# Patient Record
Sex: Female | Born: 1945
Health system: Southern US, Community
[De-identification: ages and names within clinical notes are randomized; demographics above are authoritative.]

## PROBLEM LIST (undated history)

## (undated) DIAGNOSIS — E079 Disorder of thyroid, unspecified: Secondary | ICD-10-CM

## (undated) DIAGNOSIS — D369 Benign neoplasm, unspecified site: Secondary | ICD-10-CM

## (undated) DIAGNOSIS — R011 Cardiac murmur, unspecified: Secondary | ICD-10-CM

## (undated) DIAGNOSIS — K648 Other hemorrhoids: Secondary | ICD-10-CM

## (undated) DIAGNOSIS — E041 Nontoxic single thyroid nodule: Secondary | ICD-10-CM

## (undated) DIAGNOSIS — K219 Gastro-esophageal reflux disease without esophagitis: Secondary | ICD-10-CM

## (undated) DIAGNOSIS — M8580 Other specified disorders of bone density and structure, unspecified site: Secondary | ICD-10-CM

## (undated) DIAGNOSIS — K222 Esophageal obstruction: Secondary | ICD-10-CM

## (undated) DIAGNOSIS — G4730 Sleep apnea, unspecified: Secondary | ICD-10-CM

## (undated) DIAGNOSIS — E039 Hypothyroidism, unspecified: Secondary | ICD-10-CM

## (undated) DIAGNOSIS — M1990 Unspecified osteoarthritis, unspecified site: Secondary | ICD-10-CM

## (undated) DIAGNOSIS — E785 Hyperlipidemia, unspecified: Secondary | ICD-10-CM

## (undated) HISTORY — DX: Esophageal obstruction: K22.2

## (undated) HISTORY — PX: COLONOSCOPY: SHX174

## (undated) HISTORY — DX: Other specified disorders of bone density and structure, unspecified site: M85.80

## (undated) HISTORY — PX: CATARACT EXTRACTION: SUR2

## (undated) HISTORY — DX: Benign neoplasm, unspecified site: D36.9

## (undated) HISTORY — DX: Hyperlipidemia, unspecified: E78.5

## (undated) HISTORY — DX: Nontoxic single thyroid nodule: E04.1

## (undated) HISTORY — DX: Other hemorrhoids: K64.8

## (undated) HISTORY — PX: BREAST SURGERY: SHX581

## (undated) HISTORY — DX: Disorder of thyroid, unspecified: E07.9

## (undated) HISTORY — PX: ABDOMINAL HYSTERECTOMY: SHX81

## (undated) HISTORY — DX: Unspecified osteoarthritis, unspecified site: M19.90

## (undated) HISTORY — DX: Cardiac murmur, unspecified: R01.1

## (undated) HISTORY — DX: Gastro-esophageal reflux disease without esophagitis: K21.9

---

## 2002-10-29 HISTORY — PX: OTHER SURGICAL HISTORY: SHX169

## 2015-06-08 DIAGNOSIS — K210 Gastro-esophageal reflux disease with esophagitis: Secondary | ICD-10-CM | POA: Diagnosis not present

## 2015-06-08 DIAGNOSIS — F419 Anxiety disorder, unspecified: Secondary | ICD-10-CM | POA: Diagnosis not present

## 2015-06-08 DIAGNOSIS — E039 Hypothyroidism, unspecified: Secondary | ICD-10-CM | POA: Diagnosis not present

## 2015-07-12 DIAGNOSIS — Z872 Personal history of diseases of the skin and subcutaneous tissue: Secondary | ICD-10-CM | POA: Diagnosis not present

## 2015-07-12 DIAGNOSIS — D225 Melanocytic nevi of trunk: Secondary | ICD-10-CM | POA: Diagnosis not present

## 2015-07-12 DIAGNOSIS — L720 Epidermal cyst: Secondary | ICD-10-CM | POA: Diagnosis not present

## 2015-07-12 DIAGNOSIS — L814 Other melanin hyperpigmentation: Secondary | ICD-10-CM | POA: Diagnosis not present

## 2015-07-22 DIAGNOSIS — Z23 Encounter for immunization: Secondary | ICD-10-CM | POA: Diagnosis not present

## 2016-01-16 ENCOUNTER — Telehealth: Payer: Self-pay | Admitting: *Deleted

## 2016-01-16 NOTE — Telephone Encounter (Signed)
Received patient medical records; forwarded to provider/SLS 03/20

## 2016-01-18 ENCOUNTER — Encounter: Payer: Self-pay | Admitting: Family Medicine

## 2016-01-18 ENCOUNTER — Ambulatory Visit (INDEPENDENT_AMBULATORY_CARE_PROVIDER_SITE_OTHER): Payer: Medicare Other | Admitting: Family Medicine

## 2016-01-18 VITALS — BP 124/82 | HR 68 | Ht 65.0 in | Wt 160.0 lb

## 2016-01-18 DIAGNOSIS — Z5181 Encounter for therapeutic drug level monitoring: Secondary | ICD-10-CM | POA: Diagnosis not present

## 2016-01-18 DIAGNOSIS — E890 Postprocedural hypothyroidism: Secondary | ICD-10-CM

## 2016-01-18 DIAGNOSIS — Z8719 Personal history of other diseases of the digestive system: Secondary | ICD-10-CM

## 2016-01-18 DIAGNOSIS — K219 Gastro-esophageal reflux disease without esophagitis: Secondary | ICD-10-CM

## 2016-01-18 DIAGNOSIS — E785 Hyperlipidemia, unspecified: Secondary | ICD-10-CM | POA: Insufficient documentation

## 2016-01-18 DIAGNOSIS — R0683 Snoring: Secondary | ICD-10-CM | POA: Insufficient documentation

## 2016-01-18 NOTE — Progress Notes (Signed)
[facility] [address], [place], [place] [address] [phone]  Date:  01/18/2016   Name:  Margaret Graham   DOB:  10-Mar-1946   MRN:  [id number]  PCP:  Lamar Blinks, MD    Chief Complaint: Establish Care; Gastroesophageal Reflux; and Hypothyroidism   History of Present Illness:  Margaret Graham is a 70 y.o. very pleasant female patient who presents with the following:  She and her husband recently moved to this area from [place].  They moved to be closer to her son and his family (3 granddaughters). So far they are liking [place] very much.   She has had a partial thyroidectomy for a thyroid mass- it was benign.  She is now on thyroid replacement She also has dyslipidemia which is treated with crestor About a year ago she got something stuck in her throat- she was dx with a Schatzki's ring.  She did have dilatation done at that time.  She feels like it might be coming back. Nothing has gotten stuck again but she is concerned about it Se does have some GERD sx but this is not severe She does take pepcid OTC - she takes 20 mg daily  She ate about 3.5 hours ago  She does snore at night and this wakes up her husband.  At this point she does not wish to go ahead with a sleep study because she does not want CPAP.  She will keep an eye on this. She does not have any sx such as daytime somnolence  There are no active problems to display for this patient.   Past Medical History  Diagnosis Date  . Arthritis   . GERD (gastroesophageal reflux disease)   . Heart murmur   . Thyroid disease   . Hyperlipidemia   . Schatzki's ring     Past Surgical History  Procedure Laterality Date  . Abdominal hysterectomy    . Breast surgery      Social History  Substance Use Topics  . Smoking status: Never Smoker   . Smokeless tobacco: Never Used  . Alcohol Use: 0.0 oz/week    0 Standard drinks or equivalent per week    Family  History  Problem Relation Age of Onset  . Arthritis Mother   . Arthritis Father   . Hyperlipidemia Father   . Diabetes Son   . Heart disease Paternal Uncle     Allergies  Allergen Reactions  . Declomycin [Demeclocycline]     Medication list has been reviewed and updated.  No current outpatient prescriptions on file prior to visit.   No current facility-administered medications on file prior to visit.    Review of Systems:  As per HPI- otherwise negative.   Physical Examination: Filed Vitals:   01/18/16 1516  BP: 124/82  Pulse: 68   Filed Vitals:   01/18/16 1516  Height: 4' 6.75" (1.391 m)  Weight: 160 lb (72.576 kg)   Body mass index is 37.51 kg/(m^2). Ideal Body Weight: Weight in (lb) to have BMI = 25: 106.4  GEN: WDWN, NAD, Non-toxic, A & O x 3, looks well, weight is normal (height and BMI above are incorrect, were corrected HEENT: Atraumatic, Normocephalic. Neck supple. No masses, No LAD.  Bilateral TM wnl, oropharynx normal.  PEERL,EOMI.   Small oropharyngeal space Ears and Nose: No external deformity. CV: RRR, No M/G/R. No JVD. No thrill. No extra heart sounds. PULM: CTA B, no wheezes, crackles,  rhonchi. No retractions. No resp. distress. No accessory muscle use. EXTR: No c/c/e NEURO Normal gait.  PSYCH: Normally interactive. Conversant. Not depressed or anxious appearing.  Calm demeanor.    Assessment and Plan: History of esophageal stricture - Plan: Ambulatory referral to Gastroenterology  Dyslipidemia - Plan: LDL cholesterol, direct  Medication monitoring encounter - Plan: Comprehensive metabolic panel  Postoperative hypothyroidism - Plan: TSH  Gastroesophageal reflux disease, esophagitis presence not specified  Snoring   She will decrease her pepcid to 20 mg once a day- of sx return she can go back to BID Referral to GI to monitor her esophageal ring Offered a sleep study- she declines for now but will keep this in mind Monitor TSH and LDL  today   Signed Lamar Blinks, MD

## 2016-01-18 NOTE — Patient Instructions (Signed)
We will refer you to GI to take a look at your throat for you Cut down on your pepcid to once a day- if your symptoms worsen you can go back to 2 daily I will be in touch with your labs asap We will contact your doctors office in [place] to get the rest of your records.

## 2016-01-19 ENCOUNTER — Encounter: Payer: Self-pay | Admitting: Family Medicine

## 2016-01-19 LAB — COMPREHENSIVE METABOLIC PANEL
ALT: 17 U/L (ref 0–35)
AST: 20 U/L (ref 0–37)
Albumin: 4.4 g/dL (ref 3.5–5.2)
Alkaline Phosphatase: 78 U/L (ref 39–117)
BILIRUBIN TOTAL: 0.4 mg/dL (ref 0.2–1.2)
BUN: 20 mg/dL (ref 6–23)
CO2: 29 meq/L (ref 19–32)
CREATININE: 0.89 mg/dL (ref 0.40–1.20)
Calcium: 9.6 mg/dL (ref 8.4–10.5)
Chloride: 104 mEq/L (ref 96–112)
GFR: 66.64 mL/min (ref >60.00)
GLUCOSE: 89 mg/dL (ref 70–99)
Potassium: 4.6 mEq/L (ref 3.5–5.1)
Sodium: 141 mEq/L (ref 135–145)
Total Protein: 7.4 g/dL (ref 6.0–8.3)

## 2016-01-19 LAB — LDL CHOLESTEROL, DIRECT: Direct LDL: 122 mg/dL

## 2016-01-19 LAB — TSH: TSH: 0.94 u[IU]/mL (ref 0.35–4.50)

## 2016-01-23 ENCOUNTER — Encounter: Payer: Self-pay | Admitting: Family Medicine

## 2016-01-23 DIAGNOSIS — M8580 Other specified disorders of bone density and structure, unspecified site: Secondary | ICD-10-CM | POA: Insufficient documentation

## 2016-01-23 MED ORDER — LEVOTHYROXINE SODIUM 112 MCG PO TABS: 112.0000 ug | ORAL_TABLET | ORAL | Status: DC

## 2016-01-25 ENCOUNTER — Encounter: Payer: Self-pay | Admitting: Family Medicine

## 2016-01-25 ENCOUNTER — Telehealth: Payer: Self-pay | Admitting: *Deleted

## 2016-01-25 NOTE — Telephone Encounter (Signed)
Receive patient's Medical Records; forwarded to provider/SLS 03/29

## 2016-01-26 ENCOUNTER — Telehealth: Payer: Self-pay

## 2016-01-26 NOTE — Telephone Encounter (Signed)
This encounter was created in error - please disregard.

## 2016-01-26 NOTE — Telephone Encounter (Signed)
Called patient informed medical records available for pick up at the front desk.

## 2016-01-27 ENCOUNTER — Encounter: Payer: Self-pay | Admitting: Gastroenterology

## 2016-03-22 ENCOUNTER — Ambulatory Visit (INDEPENDENT_AMBULATORY_CARE_PROVIDER_SITE_OTHER): Payer: Medicare Other | Admitting: Gastroenterology

## 2016-03-22 ENCOUNTER — Encounter: Payer: Self-pay | Admitting: Gastroenterology

## 2016-03-22 VITALS — BP 122/68 | HR 66 | Ht 65.0 in | Wt 155.0 lb

## 2016-03-22 DIAGNOSIS — K449 Diaphragmatic hernia without obstruction or gangrene: Secondary | ICD-10-CM | POA: Diagnosis not present

## 2016-03-22 DIAGNOSIS — K2100 Gastro-esophageal reflux disease with esophagitis, without bleeding: Secondary | ICD-10-CM

## 2016-03-22 MED ORDER — OMEPRAZOLE 20 MG PO CPDR: 20.0000 mg | DELAYED_RELEASE_CAPSULE | Freq: Every day | ORAL | Status: DC

## 2016-03-22 NOTE — Progress Notes (Signed)
[facility] Consult Note:  History: Margaret Graham 03/22/2016  Referring physician: Lamar Blinks, MD  Reason for consult/chief complaint: history of esophageal stricture and Constipation   Subjective HPI:  Moved her from [place] last year.  Had a normal colonoscopy in Jan 2015.  Last year had brief food impaction, then barium swallow and EGD with 89mm balloon dilation of Schatzki ring.  Was also told Grade 1 esophagitis,  hiatal hernia and "dysmotility".  She had written down the EGD findings, but did not have the actual report or photos.   Had had no preceding dysphagia before the one episode, and none since.  She had been dieting and eaten dry tuna the day it occurred. Denies odynophagia, nausea, vomiting, early satiety or weight loss.  Though she had no GERD symptoms, the EGD findings prompted her doc to recommend H2RA therapy, which she has taken since then. ROS:  Review of Systems She denies chest pain or dyspnea  Past Medical History: Past Medical History  Diagnosis Date  . Arthritis   . GERD (gastroesophageal reflux disease)   . Heart murmur   . Thyroid disease   . Hyperlipidemia   . Schatzki's ring      Past Surgical History: Past Surgical History  Procedure Laterality Date  . Abdominal hysterectomy    . Breast surgery       Family History: Family History  Problem Relation Age of Onset  . Arthritis Mother   . Arthritis Father   . Hyperlipidemia Father   . Diabetes Son   . Heart disease Paternal Uncle     Social History: Social History   Social History  . Marital Status: Married    Spouse Name: N/A  . Number of Children: N/A  . Years of Education: N/A   Social History Main Topics  . Smoking status: Never Smoker   . Smokeless tobacco: Never Used  . Alcohol Use: 0.0 oz/week    0 Standard drinks or equivalent per week  . Drug Use: No  . Sexual Activity:    Partners: Male   Other Topics Concern  . None   Social History Narrative    Retired from [employer] to [employer] 07/2015.   From [place].    Allergies: Allergies  Allergen Reactions  . Declomycin [Demeclocycline]     Outpatient Meds: Current Outpatient Prescriptions  Medication Sig Dispense Refill  . aspirin 81 MG tablet Take 81 mg by mouth daily.    . Calcium Carb-Cholecalciferol (CALCIUM 600 + D PO) Take by mouth.    . Cholecalciferol (VITAMIN D3 PO) Take 800 Int'l Units by mouth.    . famotidine (PEPCID) 20 MG tablet Take 20 mg by mouth daily.    [name] Kitchen ketoconazole (NIZORAL) 2 % shampoo Apply 1 application topically as needed for irritation.    [name] Kitchen levothyroxine (SYNTHROID, LEVOTHROID) 112 MCG tablet Take 1 tablet (112 mcg total) by mouth every morning. Take 1 Monday thru Saturday and 1/2 on sunday 90 tablet 3  . Multiple Vitamin (MULTIVITAMIN) capsule Take 1 capsule by mouth daily.    [name] Kitchen omeprazole (PRILOSEC) 20 MG capsule Take 1 capsule (20 mg total) by mouth daily. 90 capsule 1  . simvastatin (ZOCOR) 20 MG tablet Take 20 mg by mouth daily.    [name] Kitchen triamcinolone cream (KENALOG) 0.1 % Apply 1 application topically as needed.     No current facility-administered medications for this visit.      ___________________________________________________________________ Objective  Exam:  BP 122/68 mmHg  Pulse 66  Ht 5\' 5"  (1.651 m)  Wt 155 lb (70.308 kg)  BMI 25.79 kg/m2   General: this is a(n) well-appearing woman with good muscle mass.  Normal vocal quality   Eyes: sclera anicteric, no redness  ENT: oral mucosa moist without lesions, no cervical or supraclavicular lymphadenopathy, good dentition  CV: RRR without murmur, S1/S2, no JVD, no peripheral edema  Resp: clear to auscultation bilaterally, normal RR and effort noted  GI: soft, no tenderness, with active bowel sounds. No guarding or palpable organomegaly noted.  Skin; warm and dry, no rash or jaundice noted  Neuro: awake, alert and oriented x 3. Normal gross motor function and fluent  speech   Assessment: Encounter Diagnoses  Name Primary?  . Gastroesophageal reflux disease with esophagitis Yes  . Hiatal hernia    GERD based on reported EGD esophagitis finding.  Mild, since no symptoms.  "dysmotility" may have been speculative diagnosis or based on barium swallow findings.  Non-specific, possibly GERD-related. She has no dysphagia presently.  We discussed how ring will nearly always recur, even after dilation.But I still do not think she needs an EGD at this time.  Plan:  Change pepcid to omeprazole 20 mg QOD See me in a year or sooner if needed.  Thank you for the courtesy of this consult.  Please call me with any questions or concerns.  [name]  CCLamar Blinks, MD

## 2016-03-22 NOTE — Patient Instructions (Addendum)
Food Choices for Gastroesophageal Reflux Disease, Adult When you have gastroesophageal reflux disease (GERD), the foods you eat and your eating habits are very important. Choosing the right foods can help ease the discomfort of GERD. WHAT GENERAL GUIDELINES DO I NEED TO FOLLOW?  Choose fruits, vegetables, whole grains, low-fat dairy products, and low-fat meat, fish, and poultry.  Limit fats such as oils, salad dressings, butter, nuts, and avocado.  Keep a food diary to identify foods that cause symptoms.  Avoid foods that cause reflux. These may be different for different people.  Eat frequent small meals instead of three large meals each day.  Eat your meals slowly, in a relaxed setting.  Limit fried foods.  Cook foods using methods other than frying.  Avoid drinking alcohol.  Avoid drinking large amounts of liquids with your meals.  Avoid bending over or lying down until 2-3 hours after eating. WHAT FOODS ARE NOT RECOMMENDED? The following are some foods and drinks that may worsen your symptoms: Vegetables Tomatoes. Tomato juice. Tomato and spaghetti sauce. Chili peppers. Onion and garlic. Horseradish. Fruits Oranges, grapefruit, and lemon (fruit and juice). Meats High-fat meats, fish, and poultry. This includes hot dogs, ribs, ham, sausage, salami, and bacon. Dairy Whole milk and chocolate milk. Sour cream. Cream. Butter. Ice cream. Cream cheese.  Beverages Coffee and tea, with or without caffeine. Carbonated beverages or energy drinks. Condiments Hot sauce. Barbecue sauce.  Sweets/Desserts Chocolate and cocoa. Donuts. Peppermint and spearmint. Fats and Oils High-fat foods, including [place] fries and potato chips. Other Vinegar. Strong spices, such as black pepper, white pepper, red pepper, cayenne, curry powder, cloves, ginger, and chili powder. The items listed above may not be a complete list of foods and beverages to avoid. Contact your dietitian for more  information.   This information is not intended to replace advice given to you by your health care provider. Make sure you discuss any questions you have with your health care provider.   Document Released: 10/15/2005 Document Revised: 11/05/2014 Document Reviewed: 08/19/2013 Elsevier Interactive Patient Education [employer].   If you are age 64 or older, your body mass index should be between 23-30. Your There is no weight on file to calculate BMI. If this is out of the aforementioned range listed, please consider follow up with your Primary Care Provider.  If you are age 4 or younger, your body mass index should be between 19-25. Your There is no weight on file to calculate BMI. If this is out of the aformentioned range listed, please consider follow up with your Primary Care Provider.   Thank you for choosing [facility]  Dr Wilfrid Lund III

## 2016-04-02 ENCOUNTER — Ambulatory Visit
Admission: RE | Admit: 2016-04-02 | Discharge: 2016-04-02 | Disposition: A | Discharge status: Home / Self Care | Payer: Medicare Other | Source: Ambulatory Visit | Attending: Family Medicine | Admitting: Family Medicine

## 2016-04-02 DIAGNOSIS — Z780 Asymptomatic menopausal state: Secondary | ICD-10-CM | POA: Diagnosis not present

## 2016-04-02 DIAGNOSIS — M8589 Other specified disorders of bone density and structure, multiple sites: Secondary | ICD-10-CM | POA: Diagnosis not present

## 2016-04-02 DIAGNOSIS — M8580 Other specified disorders of bone density and structure, unspecified site: Secondary | ICD-10-CM

## 2016-04-09 ENCOUNTER — Encounter: Payer: Self-pay | Admitting: Family Medicine

## 2016-04-09 DIAGNOSIS — M810 Age-related osteoporosis without current pathological fracture: Secondary | ICD-10-CM

## 2016-04-23 ENCOUNTER — Other Ambulatory Visit: Payer: Self-pay | Admitting: Emergency Medicine

## 2016-04-23 MED ORDER — SIMVASTATIN 20 MG PO TABS: 20.0000 mg | ORAL_TABLET | Freq: Every day | ORAL | Status: DC

## 2016-05-08 DIAGNOSIS — H2513 Age-related nuclear cataract, bilateral: Secondary | ICD-10-CM | POA: Diagnosis not present

## 2016-05-24 ENCOUNTER — Encounter: Payer: Self-pay | Admitting: Internal Medicine

## 2016-05-24 ENCOUNTER — Ambulatory Visit (INDEPENDENT_AMBULATORY_CARE_PROVIDER_SITE_OTHER): Payer: Medicare Other | Admitting: Internal Medicine

## 2016-05-24 VITALS — BP 140/80 | HR 81 | Ht 65.0 in | Wt 155.0 lb

## 2016-05-24 DIAGNOSIS — M8580 Other specified disorders of bone density and structure, unspecified site: Secondary | ICD-10-CM

## 2016-05-24 LAB — COMPLETE METABOLIC PANEL WITH GFR
ALBUMIN: 4.2 g/dL (ref 3.6–5.1)
ALK PHOS: 85 U/L (ref 33–130)
ALT: 18 U/L (ref 6–29)
AST: 19 U/L (ref 10–35)
BILIRUBIN TOTAL: 0.5 mg/dL (ref 0.2–1.2)
BUN: 20 mg/dL (ref 7–25)
CO2: 29 mmol/L (ref 20–31)
Calcium: 9.7 mg/dL (ref 8.6–10.4)
Chloride: 103 mmol/L (ref 98–110)
Creat: 0.73 mg/dL (ref 0.60–0.93)
GFR, EST NON AFRICAN AMERICAN: 84 mL/min (ref >=60)
GFR, Est African American: >89 mL/min (ref >=60)
GLUCOSE: 116 mg/dL — AB (ref 65–99)
Potassium: 4.9 mmol/L (ref 3.5–5.3)
SODIUM: 142 mmol/L (ref 135–146)
TOTAL PROTEIN: 6.8 g/dL (ref 6.1–8.1)

## 2016-05-24 LAB — VITAMIN D 25 HYDROXY (VIT D DEFICIENCY, FRACTURES): VITD: 54.69 ng/mL (ref 30.00–100.00)

## 2016-05-24 NOTE — Progress Notes (Signed)
Patient ID: Margaret Graham, female   DOB: [birth year], 70 y.o.   MRN: [id number]    HPI  Margaret Graham is a 70 y.o.-year-old female, referred by her PCP, Dr. Lorelei Pont, for management of osteoporosis.  Pt was dx with Openia in 2008.   I reviewed pt's DEXA scans - worse at last check and her 10 year fracture risk is high: Date L1-L4 T score FN T score FRAX  04/02/2016 L1-L2: -2.3 RFN: -1.9 LFN: - 2.2 MOF: 21.2% Hip fx: 6.4%  08/09/2011 L1-L2: -1.4 RFN: -1.6 LFN: - 1.6   01/30/2007 L1-L2: -1.7 RFN: -1.4 LFN: - 1.5    She denies fractures or falls.  No dizziness/vertigo/orthostasis/poor vision.  She has no been on OP treatments.  No h/o vitamin D deficiency, but no vit D levels available for review.  Pt is on calcium and vitamin D - in the form of a MVI + 600 mg Ca daily. She also eats dairy and green, leafy, vegetables.   She has a h/o esophageal strictures.  No weight bearing exercises. She walks some, but not consistently.  She does not take high vitamin A doses.  Menopause was at mid-50's. On HRT for a long time.  No steroid inj or oral courses..   Pt does have a FH of osteoporosis in sister, son (he is on steroids).  No h/o hyper/hypocalcemia or hyperparathyroidism. No h/o kidney stones. Lab Results  Component Value Date   CALCIUM 9.6 01/18/2016   No h/o thyrotoxicosis. Reviewed TSH recent levels:  Lab Results  Component Value Date   TSH 0.94 01/18/2016   No h/o CKD. Last BUN/Cr: Lab Results  Component Value Date   BUN 20 01/18/2016   CREATININE 0.89 01/18/2016    ROS - no complaints: Constitutional: no weight gain/loss, no fatigue, no subjective hyperthermia/hypothermia Eyes: no blurry vision, no xerophthalmia ENT: no sore throat, no nodules palpated in throat, no dysphagia/odynophagia, no hoarseness Cardiovascular: no CP/SOB/palpitations/leg swelling Respiratory: no cough/SOB Gastrointestinal: no N/V/D/C Musculoskeletal: no muscle/joint aches Skin: no  rashes Neurological: no tremors/numbness/tingling/dizziness Psychiatric: no depression/anxiety  Past Medical History:  Diagnosis Date  . Arthritis   . GERD (gastroesophageal reflux disease)   . Heart murmur   . Hyperlipidemia   . Schatzki's ring   . Thyroid disease    Past Surgical History:  Procedure Laterality Date  . ABDOMINAL HYSTERECTOMY    . BREAST SURGERY     Social History   Social History  . Marital status: Married    Spouse name: N/A  . Number of children: 2   Occupational History  . retired   Social History Main Topics  . Smoking status: Never Smoker  . Smokeless tobacco: Never Used  . Alcohol use 0.0 oz/week - socially  . Drug use: No   Social History Narrative   Retired from [employer] to [employer] 07/2015.   From [place].   Current Outpatient Prescriptions on File Prior to Visit  Medication Sig Dispense Refill  . aspirin 81 MG tablet Take 81 mg by mouth daily.    . Calcium Carb-Cholecalciferol (CALCIUM 600 + D PO) Take by mouth.    . Cholecalciferol (VITAMIN D3 PO) Take 800 Int'l Units by mouth.    . famotidine (PEPCID) 20 MG tablet Take 20 mg by mouth daily.    [name] Kitchen ketoconazole (NIZORAL) 2 % shampoo Apply 1 application topically as needed for irritation.    [name] Kitchen levothyroxine (SYNTHROID, LEVOTHROID) 112 MCG tablet Take 1 tablet (112 mcg total) by mouth  every morning. Take 1 Monday thru Saturday and 1/2 on sunday 90 tablet 3  . Multiple Vitamin (MULTIVITAMIN) capsule Take 1 capsule by mouth daily.    [name] Kitchen omeprazole (PRILOSEC) 20 MG capsule Take 1 capsule (20 mg total) by mouth daily. 90 capsule 1  . simvastatin (ZOCOR) 20 MG tablet Take 1 tablet (20 mg total) by mouth daily. 90 tablet 0  . triamcinolone cream (KENALOG) 0.1 % Apply 1 application topically as needed.     No current facility-administered medications on file prior to visit.    Allergies  Allergen Reactions  . Declomycin [Demeclocycline]    Family History  Problem Relation Age of Onset  .  Arthritis Mother   . Arthritis Father   . Hyperlipidemia Father   . Diabetes Son   . Heart disease Paternal Uncle    PE: BP 140/80   Pulse 81   Ht 5\' 5"  (1.651 m)   Wt 155 lb (70.3 kg)   SpO2 (!) 89%   BMI 25.79 kg/m  Wt Readings from Last 3 Encounters:  05/24/16 155 lb (70.3 kg)  03/22/16 155 lb (70.3 kg)  01/18/16 160 lb (72.6 kg)   Constitutional: normal weight, in NAD. No kyphosis. Eyes: PERRLA, EOMI, no exophthalmos ENT: moist mucous membranes, no thyromegaly, no cervical lymphadenopathy Cardiovascular: RRR, No MRG Respiratory: CTA B Gastrointestinal: abdomen soft, NT, ND, BS+ Musculoskeletal: no deformities, strength intact in all 4, no spine tenderness to palpation Skin: moist, warm, no rashes Neurological: no tremor with outstretched hands, DTR normal in all 4  Assessment: 1. Osteoporosis  Plan: 1. Osteoporosis - likely postmenopausal, she has FH of early OP - Discussed about increased risk of fracture, depending on the T score, greatly increased when the T score is lower than -2.5, but it is actually a continuum and -2.5 should not be regarded as an absolute threshold. We reviewed her DEXA scan report together, and I explained that based on the T scores and the FRAX scores, she has an increased risk for fractures.  - we reviewed her dietary and supplemental calcium and vitamin D intakeshe gets ~1000-1200 mg of calcium daily. I will check vit D today to see if she needs supplementation - given her specific instructions about food sources for Calcium and Vitamin D - see pt instructions  - discussed fall precautions   - given handout from [facility] Re: weight bearing exercises - advised to do this every day or at least 5/7 days - we discussed about maintaining a good amount of protein in her diet. The recommended daily protein intake is ~0.8 g per kilogram per day. I advised her to try to aim for this amount, since a diet low in proteins can  exacerbate osteoporosis. Also, avoid smoking or >2 drinks of alcohol a day. - We discussed about the different medication classes, benefits and side effects (including atypical fractures and ONJ - no dental workup in progress or planned).  - I explained that, since she has Es strictures, I would not use oral bisphosphonates, so my first choice would be sq denosumab (Prolia) for 3-6 years, then zoledronic acid (iv Reclast) for 1-2 years, but we can also start with Zolendronic acid. I do not think she would qualify for Teriparatide for now. She agrees. Pt was given reading information about Prolia and Reclast, and I explained the mechanism of action and expected benefits. She will let me know about her decision. - will check a new DEXA scan in 2 years after  starting Prolia -  I explained that the first indication that the treatment is working is her not having anymore fractures. DEXA scan changes are secondary: unchanged or slightly higher T-scores are desirable - will see pt back in a year  Orders Placed This Encounter  Procedures  . COMPLETE METABOLIC PANEL WITH GFR  . VITAMIN D 25 Hydroxy (Vit-D Deficiency, Fractures)   Component     Latest Ref Rng & Units 05/24/2016  Sodium     135 - 146 mmol/L 142  Potassium     3.5 - 5.3 mmol/L 4.9  Chloride     98 - 110 mmol/L 103  CO2     20 - 31 mmol/L 29  Glucose     65 - 99 mg/dL 116 (H)  BUN     7 - 25 mg/dL 20  Creatinine     0.60 - 0.93 mg/dL 0.73  Total Bilirubin     0.2 - 1.2 mg/dL 0.5  Alkaline Phosphatase     33 - 130 U/L 85  AST     10 - 35 U/L 19  ALT     6 - 29 U/L 18  Total Protein     6.1 - 8.1 g/dL 6.8  Albumin     3.6 - 5.1 g/dL 4.2  Calcium     8.6 - 10.4 mg/dL 9.7  GFR, Est African American     >=60 mL/min >89  GFR, Est Non African American     >=60 mL/min 84  VITD     30.00 - 100.00 ng/mL 54.69   Labs are normal. Will await her tx decision.  Philemon Kingdom, MD PhD [facility]

## 2016-05-24 NOTE — Patient Instructions (Addendum)
Please stop at the lab.  Think about Reclast and Prolia and let me know how you want to proceed.  You need approx. 60 g protein per day.  Please return in 1 year.  Exercise for Strong Bones (from [facility]) There are two types of exercises that are important for building and maintaining bone density:  weight-bearing and muscle-strengthening exercises. Weight-bearing Exercises These exercises include activities that make you move against gravity while staying upright. Weight-bearing exercises can be high-impact or low-impact. High-impact weight-bearing exercises help build bones and keep them strong. If you have broken a bone due to osteoporosis or are at risk of breaking a bone, you may need to avoid high-impact exercises. If you're not sure, you should check with your healthcare provider. Examples of high-impact weight-bearing exercises are: . Dancing . Doing high-impact aerobics . Hiking . Jogging/running . Jumping Rope . Stair climbing . Tennis Low-impact weight-bearing exercises can also help keep bones strong and are a safe alternative if you cannot do high-impact exercises. Examples of low-impact weight-bearing exercises are: . Using elliptical training machines . Doing low-impact aerobics . Using stair-step machines . Fast walking on a treadmill or outside Muscle-Strengthening Exercises These exercises include activities where you move your body, a weight or some other resistance against gravity. They are also known as resistance exercises and include: . Lifting weights . Using elastic exercise bands . Using weight machines . Lifting your own body weight . Functional movements, such as standing and rising up on your toes Yoga and Pilates can also improve strength, balance and flexibility. However, certain positions may not be safe for people with osteoporosis or those at increased risk of broken bones. For example, exercises that have you bend forward  may increase the chance of breaking a bone in the spine. A physical therapist should be able to help you learn which exercises are safe and appropriate for you. Non-Impact Exercises Non-impact exercises can help you to improve balance, posture and how well you move in everyday activities. These exercises can also help to increase muscle strength and decrease the risk of falls and broken bones. Some of these exercises include: . Balance exercises that strengthen your legs and test your balance, such as Tai Chi, can decrease your risk of falls. . Posture exercises that improve your posture and reduce rounded or "sloping" shoulders can help you decrease the chance of breaking a bone, especially in the spine. . Functional exercises that improve how well you move can help you with everyday activities and decrease your chance of falling and breaking a bone. For example, if you have trouble getting up from a chair or climbing stairs, you should do these activities as exercises. A physical therapist can teach you balance, posture and functional exercises. Starting a New Exercise Program If you haven't exercised regularly for a while, check with your healthcare provider before beginning a new exercise program--particularly if you have health problems such as heart disease, diabetes or high blood pressure. If you're at high risk of breaking a bone, you should work with a physical therapist to develop a safe exercise program. Once you have your healthcare provider's approval, start slowly. If you've already broken bones in the spine because of osteoporosis, be very careful to avoid activities that require reaching down, bending forward, rapid twisting motions, heavy lifting and those that increase your chance of a fall. As you get started, your muscles may feel sore for a day or two after you exercise. If soreness lasts  longer, you may be working too hard and need to ease up. Exercises should be done in a pain-free  range of motion. How Much Exercise Do You Need? Weight-bearing exercises 30 minutes on most days of the week. Do a 30-minutesession or multiple sessions spread out throughout the day. The benefits to your bones are the same.   Muscle-strengthening exercises Two to three days per week. If you don't have much time for strengthening/resistance training, do small amounts at a time. You can do just one body part each day. For example do arms one day, legs the next and trunk the next. You can also spread these exercises out during your normal day.  Balance, posture and functional exercises Every day or as often as needed. You may want to focus on one area more than the others. If you have fallen or lose your balance, spend time doing balance exercises. If you are getting rounded shoulders, work more on posture exercises. If you have trouble climbing stairs or getting up from the couch, do more functional exercises. You can also perform these exercises at one time or spread them during your day. Work with a phyiscal therapist to learn the right exercises for you.    How Can I Prevent Falls? Men and women with osteoporosis need to take care not to fall down. Falls can break bones. Some reasons people fall are: Poor vision  Poor balance  Certain diseases that affect how you walk  Some types of medicine, such as sleeping pills.  Some tips to help prevent falls outdoors are: Use a cane or walker  Wear rubber-soled shoes so you don't slip  Walk on grass when sidewalks are slippery  In winter, put salt or kitty litter on icy sidewalks.  Some ways to help prevent falls indoors are: Keep rooms free of clutter, especially on floors  Use plastic or carpet runners on slippery floors  Wear low-heeled shoes that provide good support  Do not walk in socks, stockings, or slippers  Be sure carpets and area rugs have skid-proof backs or are tacked to the floor  Be sure stairs are well lit and have rails on both  sides  Put grab bars on bathroom walls near tub, shower, and toilet  Use a rubber bath mat in the shower or tub  Keep a flashlight next to your bed  Use a sturdy step stool with a handrail and wide steps  Add more lights in rooms (and night lights) Buy a cordless phone to keep with you so that you don't have to rush to the phone       when it rings and so that you can call for help if you fall.   (adapted from http://www.niams.[employer].at)  Dietary sources of calcium and vitamin D:  Calcium content (mg) - http://www.niams.https://www.gonzalez.org/  Fortified oatmeal, 1 packet 350  Sardines, canned in oil, with edible bones, 3 oz. 324  Cheddar cheese, 1 oz. shredded 306  Milk, nonfat, 1 cup 302  Milkshake, 1 cup 300  Yogurt, plain, low-fat, 1 cup 300  Soybeans, cooked, 1 cup 261  Tofu, firm, with calcium,  cup 204  Orange juice, fortified with calcium, 6 oz. 200-260 (varies)  Salmon, canned, with edible bones, 3 oz. 181  Pudding, instant, made with 2% milk,  cup 153  Baked beans, 1 cup 142  Cottage cheese, 1% milk fat, 1 cup 138  Spaghetti, lasagna, 1 cup 125  Frozen yogurt, vanilla, soft-serve,  cup 103  Ready-to-eat cereal,  fortified with calcium, 1 cup 100-1,000 (varies)  Cheese pizza, 1 slice 100  Fortified waffles, 2 100  Turnip greens, boiled,  cup 99  Broccoli, raw, 1 cup 90  Ice cream, vanilla,  cup 85  Soy or rice milk, fortified with calcium, 1 cup 80-500 (varies)   Vitamin D content (International Units, IU) - https://www.ars.usda.gov Cod liver oil, 1 tablespoon 1,360  Swordfish, cooked, 3 oz 566  Salmon (sockeye), cooked, 3 oz 447  Tuna fish, canned in water, drained, 3 oz 154  Orange juice fortified with vitamin D, 1 cup (check product labels, as amount of added vitamin D varies) 137  Milk, nonfat, reduced fat, and whole, vitamin D-fortified, 1 cup 115-124  Yogurt, fortified with 20% of the daily  value for vitamin D, 6 oz 80  Margarine, fortified, 1 tablespoon 60  Sardines, canned in oil, drained, 2 sardines 46  Liver, beef, cooked, 3 oz 42  Egg, 1 large (vitamin D is found in yolk) 41  Ready-to-eat cereal, fortified with 10% of the daily value for vitamin D, 0.75-1 cup  40  Cheese, Swiss, 1 oz 6   Zoledronic acid: Patient drug information (Up-to-Date) Copyright 743-303-6522 [employer] All rights reserved.  Brand Names: [place]  Reclast;  Zometa What is this drug used for?  .It is used to treat high calcium levels.  .It is used when treating some cancers.  .It is used to treat Paget's disease.  .It is used to put off or treat soft, brittle bones (osteoporosis).  .It may be given to you for other reasons. Talk with the doctor. What do I need to tell my doctor BEFORE I take this drug?  All products:  .If you have an allergy to zoledronic acid or any other part of this drug.  .If you are allergic to any drugs like this one, any other drugs, foods, or other substances. Tell your doctor about the allergy and what signs you had, like rash; hives; itching; shortness of breath; wheezing; cough; swelling of face, lips, tongue, or throat; or any other signs.  Reclast:  .If you have low calcium levels.  .If you have very bad kidney disease.  This is not a list of all drugs or health problems that interact with this drug.  Tell your doctor and pharmacist about all of your drugs (prescription or OTC, natural products, vitamins) and health problems. You must check to make sure that it is safe for you to take this drug with all of your drugs and health problems. Do not start, stop, or change the dose of any drug without checking with your doctor. What are some things I need to know or do while I take this drug?  All products:  .Tell dentists, surgeons, and other doctors that you use this drug.  .Worsening of asthma has happened in people taking drugs like this one. Talk with your doctor.   .This drug may raise the chance of a broken leg. Talk with your doctor.  .Have your blood work checked often. Talk with your doctor.  .Have a bone density test. Talk with your doctor.  .Have a dental exam before starting this drug.  .Take good care of your teeth. See a dentist often.  .Do not give to a child. Talk with your doctor.  .If you are 21 or older, use this drug with care. You could have more side effects.  .This drug may cause harm to the unborn baby if you take it while you are pregnant.  [name] Kitchen  Tell your doctor if you are pregnant or plan on getting pregnant. You will need to talk about the benefits and risks of using this drug while you are pregnant.  .Tell your doctor if you are breast-feeding. You will need to talk about any risks to your baby.  Zometa:  .Take calcium and vitamin D as you were told by your doctor.  Reclast:  .This drug works best when used with calcium/vitamin D and weight-bearing workouts like walking or PT (physical therapy).  .Follow the diet and workout plan that your doctor told you about.  .Use birth control that you can trust to prevent pregnancy while taking this drug. What are some side effects that I need to call my doctor about right away?  WARNING/CAUTION: Even though it may be rare, some people may have very bad and sometimes deadly side effects when taking a drug. Tell your doctor or get medical help right away if you have any of the following signs or symptoms that may be related to a very bad side effect:  .Signs of an allergic reaction, like rash; hives; itching; red, swollen, blistered, or peeling skin with or without fever; wheezing; tightness in the chest or throat; trouble breathing or talking; unusual hoarseness; or swelling of the mouth, face, lips, tongue, or throat.  .Signs of low calcium levels like muscle cramps or spasms, numbness and tingling, or seizures.  .Signs of kidney problems like unable to pass urine, change in the amount of urine  passed, blood in the urine, or a big weight gain.  .Very bad bone, joint, or muscle pain.  .Any new or strange groin, hip, or thigh pain.  .Chest pain.  .A heartbeat that does not feel normal.  .Slow heartbeat.  .Change in eyesight.  .Eye pain.  .Mouth sores.  .Trouble swallowing.  .Very bad pain when swallowing.  .Any bruising or bleeding.  .Pain where the shot was given.  .Redness or swelling where the shot is given.  .This drug may cause jawbone problems. The chance may be higher the longer you take this drug. The chance may be higher if you have cancer, dental problems, dentures that do not fit well, anemia, blood clotting problems, or an infection. The chance may also be higher if you are having dental work or if you are getting chemo, some steroid drugs, or radiation. Call your doctor right away if you have jaw swelling or pain. What are some other side effects of this drug?  All drugs may cause side effects. However, many people have no side effects or only have minor side effects. Call your doctor or get medical help if any of these side effects or any other side effects bother you or do not go away:  All products:  .Dizziness.  [name] KitchenUpset stomach or throwing up.  .Irritation where the shot is given.  .Feeling tired or weak.  .Belly pain.  [name] KitchenHeadache.  .Flu-like signs.  .Loose stools (diarrhea).  .Muscle or joint pain.  .Back pain.  Zometa:  .Not able to sleep.  .Not hungry.  .Hard stools (constipation).  .Weight loss.  .Cough.  These are not all of the side effects that may occur. If you have questions about side effects, call your doctor. Call your doctor for medical advice about side effects.  You may report side effects to your national health agency. How is this drug best taken?  Use this drug as ordered by your doctor. Read and follow the dosing on the label closely.  All products:  .  It is given as a shot into a vein over a period of time.  .Drink lots of noncaffeine  liquids unless told to drink less liquid by your doctor.  Reclast:  .Acetaminophen may be given to lower fever and chills.  .Drink at least 2 glasses of liquids a few hours before you get this drug. What do I do if I miss a dose?  .Call the doctor to find out what to do. How do I store and/or throw out this drug?  [name] KitchenThis drug will be given to you in a hospital or doctor's office. You will not store it at home.  [name] KitchenKeep all drugs out of the reach of children and pets.  .Check with your pharmacist about how to throw out unused drugs.  General drug facts  .If your symptoms or health problems do not get better or if they become worse, call your doctor.  .Do not share your drugs with others and do not take anyone else's drugs.  [name] KitchenKeep a list of all your drugs (prescription, natural products, vitamins, OTC) with you. Give this list to your doctor.  .Talk with the doctor before starting any new drug, including prescription or OTC, natural products, or vitamins.  .Some drugs may have another patient information leaflet. If you have any questions about this drug, please talk with your doctor, pharmacist, or other health care provider.  .If you think there has been an overdose, call your poison control center or get medical care right away. Be ready to tell or show what was taken, how much, and when it happened.   Denosumab: Patient drug information (Up-to-date) Copyright [phone] [employer] All rights reserved.  Brand Names: [place]  ProliaRivka Barbara What is this drug used for?  .It is used to treat soft, brittle bones (osteoporosis).  .It is used for bone growth.  .It is used when treating some cancers.  .It may be given to you for other reasons. Talk with the doctor. What do I need to tell my doctor BEFORE I take this drug?  All products:  .If you have an allergy to denosumab or any other part of this drug.  .If you are allergic to any drugs like this one, any other drugs, foods, or other  substances. Tell your doctor about the allergy and what signs you had, like rash; hives; itching; shortness of breath; wheezing; cough; swelling of face, lips, tongue, or throat; or any other signs.  .If you have low calcium levels.  ProliaT:  .If you are pregnant or may be pregnant. Do not take this drug if you are pregnant.  This is not a list of all drugs or health problems that interact with this drug.  Tell your doctor and pharmacist about all of your drugs (prescription or OTC, natural products, vitamins) and health problems. You must check to make sure that it is safe for you to take this drug with all of your drugs and health problems. Do not start, stop, or change the dose of any drug without checking with your doctor. What are some things I need to know or do while I take this drug?  All products:  .Tell dentists, surgeons, and other doctors that you use this drug.  .This drug may raise the chance of a broken leg. Talk with your doctor.  .Have your blood work checked. Talk with your doctor.  .Have a bone density test. Talk with your doctor.  .Take calcium and vitamin D as you were told by your  doctor.  .Have a dental exam before starting this drug.  .Take good care of your teeth. See a dentist often.  .If you smoke, talk with your doctor.  .Do not give to a child. Talk with your doctor.  .Tell your doctor if you are breast-feeding. You will need to talk about any risks to your baby.  Xgeva:  .This drug may cause harm to the unborn baby if you take it while you are pregnant. If you get pregnant while taking this drug, call your doctor right away.  ProliaT:  .Very bad infections have been reported with use of this drug. If you have any infection, are taking antibiotics now or in the recent past, or have many infections, talk with your doctor.  .You may have more chance of getting an infection. Wash hands often. Stay away from people with infections, colds, or flu.  .Use birth control  that you can trust to prevent pregnancy while taking this drug.  .If you are a man and your sex partner is pregnant or gets pregnant at any time while you are being treated, talk with your doctor. What are some side effects that I need to call my doctor about right away?  WARNING/CAUTION: Even though it may be rare, some people may have very bad and sometimes deadly side effects when taking a drug. Tell your doctor or get medical help right away if you have any of the following signs or symptoms that may be related to a very bad side effect:  All products:  .Signs of an allergic reaction, like rash; hives; itching; red, swollen, blistered, or peeling skin with or without fever; wheezing; tightness in the chest or throat; trouble breathing or talking; unusual hoarseness; or swelling of the mouth, face, lips, tongue, or throat.  .Signs of low calcium levels like muscle cramps or spasms, numbness and tingling, or seizures.  .Mouth sores.  .Any new or strange groin, hip, or thigh pain.  .This drug may cause jawbone problems. The chance may be higher the longer you take this drug. The chance may be higher if you have cancer, dental problems, dentures that do not fit well, anemia, blood clotting problems, or an infection. The chance may also be higher if you are having dental work or if you are getting chemo, some steroid drugs, or radiation. Call your doctor right away if you have jaw swelling or pain.  Xgeva:  .Not hungry.  .Muscle pain or weakness.  .Seizures.  .Shortness of breath.  ProliaT:  .Signs of infection. These include a fever of 100.88F (38C) or higher, chills, very bad sore throat, ear or sinus pain, cough, more sputum or change in color of sputum, pain with passing urine, mouth sores, wound that will not heal, or anal itching or pain.  .Signs of a pancreas problem (pancreatitis) like very bad stomach pain, very bad back pain, or very bad upset stomach or throwing up.  .Chest pain.  .A  heartbeat that does not feel normal.  .Very bad skin irritation.  .Feeling very tired or weak.  .Bladder pain or pain when passing urine or change in how much urine is passed.  .Passing urine often.  .Swelling in the arms or legs. What are some other side effects of this drug?  All drugs may cause side effects. However, many people have no side effects or only have minor side effects. Call your doctor or get medical help if any of these side effects or any other side effects bother you  or do not go away:  Xgeva:  .Feeling tired or weak.  [name] KitchenHeadache.  [name] KitchenUpset stomach or throwing up.  .Loose stools (diarrhea).  .Cough.  ProliaT:  .Back pain.  .Muscle or joint pain.  .Sore throat.  .Runny nose.  .Pain in arms or legs.  These are not all of the side effects that may occur. If you have questions about side effects, call your doctor. Call your doctor for medical advice about side effects.  You may report side effects to your national health agency. How is this drug best taken?  Use this drug as ordered by your doctor. Read and follow the dosing on the label closely.  .It is given as a shot into the fatty part of the skin. What do I do if I miss a dose?  .Call the doctor to find out what to do. How do I store and/or throw out this drug?  [name] KitchenThis drug will be given to you in a hospital or doctor's office. You will not store it at home.  [name] KitchenKeep all drugs out of the reach of children and pets.  .Check with your pharmacist about how to throw out unused drugs.  General drug facts  .If your symptoms or health problems do not get better or if they become worse, call your doctor.  .Do not share your drugs with others and do not take anyone else's drugs.  [name] KitchenKeep a list of all your drugs (prescription, natural products, vitamins, OTC) with you. Give this list to your doctor.  .Talk with the doctor before starting any new drug, including prescription or OTC, natural products, or vitamins.  .Some drugs  may have another patient information leaflet. If you have any questions about this drug, please talk with your doctor, pharmacist, or other health care provider.  .If you think there has been an overdose, call your poison control center or get medical care right away. Be ready to tell or show what was taken, how much, and when it happened.

## 2016-05-25 ENCOUNTER — Encounter: Payer: Self-pay | Admitting: Internal Medicine

## 2016-07-24 ENCOUNTER — Other Ambulatory Visit: Payer: Self-pay | Admitting: Family Medicine

## 2016-07-24 ENCOUNTER — Telehealth: Payer: Self-pay | Admitting: Family Medicine

## 2016-07-24 DIAGNOSIS — Z119 Encounter for screening for infectious and parasitic diseases, unspecified: Secondary | ICD-10-CM

## 2016-07-24 DIAGNOSIS — E039 Hypothyroidism, unspecified: Secondary | ICD-10-CM

## 2016-07-24 MED ORDER — SIMVASTATIN 20 MG PO TABS: 20.0000 mg | ORAL_TABLET | Freq: Every day | ORAL | 0 refills | Status: DC

## 2016-07-24 NOTE — Telephone Encounter (Signed)
Patient is wondering about the Hepatitis C screening. She knows that she is in the age group for getting it done but is wondering if it is absolutely necessary if age is her only factor?  Patient would also like to come in and get her TSH checked as she remembers being told to have it checked again in September. She will schedule her blood work and flu shot when the orders are ready.

## 2016-07-25 ENCOUNTER — Encounter: Payer: Self-pay | Admitting: Family Medicine

## 2016-07-25 NOTE — Telephone Encounter (Signed)
Sent mychart message

## 2016-08-03 ENCOUNTER — Encounter: Payer: Self-pay | Admitting: Family Medicine

## 2016-08-03 ENCOUNTER — Other Ambulatory Visit: Payer: Medicare Other

## 2016-08-03 DIAGNOSIS — E039 Hypothyroidism, unspecified: Secondary | ICD-10-CM | POA: Diagnosis not present

## 2016-08-03 DIAGNOSIS — Z119 Encounter for screening for infectious and parasitic diseases, unspecified: Secondary | ICD-10-CM

## 2016-08-03 LAB — TSH: TSH: 0.27 m[IU]/L — AB

## 2016-08-03 NOTE — Addendum Note (Signed)
Addended by: Caffie Pinto on: 08/03/2016 02:13 PM   Modules accepted: Orders

## 2016-08-04 ENCOUNTER — Encounter: Payer: Self-pay | Admitting: Family Medicine

## 2016-08-04 DIAGNOSIS — E034 Atrophy of thyroid (acquired): Secondary | ICD-10-CM

## 2016-08-04 LAB — HEPATITIS C ANTIBODY: HCV Ab: NEGATIVE

## 2016-08-05 MED ORDER — LEVOTHYROXINE SODIUM 100 MCG PO TABS: 100.0000 ug | ORAL_TABLET | ORAL | 3 refills | Status: AC

## 2016-08-06 ENCOUNTER — Ambulatory Visit: Payer: Medicare Other

## 2016-08-20 ENCOUNTER — Ambulatory Visit (INDEPENDENT_AMBULATORY_CARE_PROVIDER_SITE_OTHER): Payer: Medicare Other

## 2016-08-20 DIAGNOSIS — Z23 Encounter for immunization: Secondary | ICD-10-CM

## 2016-08-27 ENCOUNTER — Ambulatory Visit (HOSPITAL_BASED_OUTPATIENT_CLINIC_OR_DEPARTMENT_OTHER)
Admission: RE | Admit: 2016-08-27 | Discharge: 2016-08-27 | Disposition: A | Discharge status: Home / Self Care | Payer: Medicare Other | Source: Ambulatory Visit | Attending: Family Medicine | Admitting: Family Medicine

## 2016-08-27 ENCOUNTER — Ambulatory Visit (INDEPENDENT_AMBULATORY_CARE_PROVIDER_SITE_OTHER): Payer: Medicare Other | Admitting: Family Medicine

## 2016-08-27 VITALS — BP 121/63 | HR 84 | Temp 97.7°F | Wt 157.0 lb

## 2016-08-27 DIAGNOSIS — R05 Cough: Secondary | ICD-10-CM

## 2016-08-27 DIAGNOSIS — R059 Cough, unspecified: Secondary | ICD-10-CM

## 2016-08-27 DIAGNOSIS — J9811 Atelectasis: Secondary | ICD-10-CM | POA: Diagnosis not present

## 2016-08-27 DIAGNOSIS — R5383 Other fatigue: Secondary | ICD-10-CM | POA: Diagnosis not present

## 2016-08-27 DIAGNOSIS — I700 Atherosclerosis of aorta: Secondary | ICD-10-CM | POA: Insufficient documentation

## 2016-08-27 DIAGNOSIS — J984 Other disorders of lung: Secondary | ICD-10-CM | POA: Insufficient documentation

## 2016-08-27 MED ORDER — HYDROCOD POLST-CPM POLST ER 10-8 MG/5ML PO SUER: 5.0000 mL | Freq: Two times a day (BID) | ORAL | 0 refills | Status: DC | PRN

## 2016-08-27 NOTE — Patient Instructions (Addendum)
You likely have a viral infection- your flu test is negative which is good news!  Use the tussionex as needed for cough- however remember that it will make you sleepy and it lasts up to 12 hours- be careful with use!   We will get a chest xray for you today to make sure you do not have pneumonia - I will be in touch with this result asap

## 2016-08-27 NOTE — Progress Notes (Signed)
Pre visit review using our clinic review tool, if applicable. No additional management support is needed unless otherwise documented below in the visit note. 

## 2016-08-27 NOTE — Progress Notes (Signed)
[place] at [facility] [address], [place], [place] [address] [phone] [id number] [phone]  Date:  08/27/2016   Name:  Margaret Graham   DOB:  [birth year]   MRN:  [id number]  PCP:  Lamar Blinks, MD    Chief Complaint: Cough (phlem clear color, ear fullness, chest congestion. cannot stop coughing, muscle aches. )   History of Present Illness:  Margaret Graham is a 70 y.o. very pleasant female patient who presents with the following:  Last seen by myself in March of this year to establish care.  History of partial thyroidectomy for a benign thyroid mass.    She is here today for a sick visit- started last Wednesday (today is Monday). She first noted phlegm in her throat, then severe cough, body aches, fatigue.  She has not noted a fever No vomiting.  Her left ear feels congested, no ST however.  No belly pain She is able to eat   Her husband has not been ill- he did have heart surgery not long ago.  Her son, DIL and grand-kids have had a cold but this was a couple of weeks or more ago She recently had a flu shot and wonders if she might have the flu  She has tried some OTC meds- nyquil, mucinex DM However this is really not helping with her cough that does keep her awake at night   Patient Active Problem List   Diagnosis Date Noted  . Osteopenia 01/23/2016  . Dyslipidemia 01/18/2016  . History of esophageal stricture 01/18/2016  . Postoperative hypothyroidism 01/18/2016  . Esophageal reflux 01/18/2016  . Snoring 01/18/2016    Past Medical History:  Diagnosis Date  . Arthritis   . GERD (gastroesophageal reflux disease)   . Heart murmur   . Hyperlipidemia   . Schatzki's ring   . Thyroid disease     Past Surgical History:  Procedure Laterality Date  . ABDOMINAL HYSTERECTOMY    . BREAST SURGERY      Social History  Substance Use Topics  . Smoking status: Never Smoker  . Smokeless tobacco: Never Used  . Alcohol use 0.0  oz/week    Family History  Problem Relation Age of Onset  . Arthritis Mother   . Arthritis Father   . Hyperlipidemia Father   . Diabetes Son   . Heart disease Paternal Uncle     Allergies  Allergen Reactions  . Declomycin [Demeclocycline]     Medication list has been reviewed and updated.  Current Outpatient Prescriptions on File Prior to Visit  Medication Sig Dispense Refill  . aspirin 81 MG tablet Take 81 mg by mouth daily.    . Calcium Carb-Cholecalciferol (CALCIUM 600 + D PO) Take by mouth.    . Cholecalciferol (VITAMIN D3 PO) Take 800 Int'l Units by mouth.    . famotidine (PEPCID) 20 MG tablet Take 20 mg by mouth daily.    [name] Kitchen ketoconazole (NIZORAL) 2 % shampoo Apply 1 application topically as needed for irritation.    [name] Kitchen levothyroxine (SYNTHROID, LEVOTHROID) 100 MCG tablet Take 1 tablet (100 mcg total) by mouth every morning. 30 tablet 3  . Multiple Vitamin (MULTIVITAMIN) capsule Take 1 capsule by mouth daily.    [name] Kitchen omeprazole (PRILOSEC) 20 MG capsule Take 1 capsule (20 mg total) by mouth daily. 90 capsule 1  . simvastatin (ZOCOR) 20 MG tablet Take 1 tablet (20 mg total) by mouth daily. 90 tablet 0  . triamcinolone cream (KENALOG) 0.1 %  Apply 1 application topically as needed.     No current facility-administered medications on file prior to visit.     Review of Systems:  As per HPI- otherwise negative. She has used tussionex in the past with success  No GI symptoms   Physical Examination: Blood pressure 121/63, pulse 84, temperature 97.7 F (36.5 C), temperature source Oral, weight 157 lb (71.2 kg), SpO2 100 %.  Ideal Body Weight:    GEN: WDWN, NAD, Non-toxic, A & O x 3, looks well HEENT: Atraumatic, Normocephalic. Neck supple. No masses, No LAD.  Bilateral TM wnl, oropharynx normal.  PEERL,EOMI.   Ears and Nose: No external deformity. CV: RRR, No M/G/R. No JVD. No thrill. No extra heart sounds. PULM: CTA B, no wheezes, crackles, rhonchi. No retractions. No  resp. distress. No accessory muscle use. ABD: S, NT, ND, +BS. No rebound. No HSM. EXTR: No c/c/e NEURO Normal gait.  PSYCH: Normally interactive. Conversant. Not depressed or anxious appearing.  Calm demeanor.   Dg Chest 2 View  Result Date: 08/27/2016 CLINICAL DATA:  Cough and congestion for 5 days,nonsmoker EXAM: CHEST  2 VIEW COMPARISON:  None. FINDINGS: Biapical pleuroparenchymal scarring appear stable. Airway thickening is present, suggesting bronchitis or reactive airways disease. Cardiac and mediastinal margins appear normal. Atherosclerotic calcification of the aortic arch noted. No significant airspace opacities. Possible right middle lobe subsegmental atelectasis. The possible enchondroma in the right proximal humeral metaphysis. IMPRESSION: 1. Airway thickening is present, suggesting bronchitis or reactive airways disease. 2. Biapical pleuroparenchymal scarring. 3. Subsegmental atelectasis in the right middle lobe. 4. Atherosclerotic calcification of the aortic arch. Electronically Signed   By: Van Clines M.D.   On: 08/27/2016 12:37   Negative rapid flu today Assessment and Plan: Cough - Plan: chlorpheniramine-HYDROcodone (TUSSIONEX PENNKINETIC ER) 10-8 MG/5ML SUER, DG Chest 2 View  Fatigue, unspecified type  Here today with several days of cough - rapid flu is negative.  Will check CXR but suspect her sx are viral in origin.  Will treat with tussionex as needed for cough Released her CXR results to her via mychart   Signed Lamar Blinks, MD

## 2016-08-29 ENCOUNTER — Encounter: Payer: Self-pay | Admitting: Family Medicine

## 2016-08-29 ENCOUNTER — Other Ambulatory Visit: Payer: Self-pay | Admitting: Emergency Medicine

## 2016-08-29 ENCOUNTER — Telehealth: Payer: Self-pay | Admitting: Family Medicine

## 2016-08-29 DIAGNOSIS — R059 Cough, unspecified: Secondary | ICD-10-CM

## 2016-08-29 DIAGNOSIS — R05 Cough: Secondary | ICD-10-CM

## 2016-08-29 MED ORDER — PREDNISONE 20 MG PO TABS: ORAL_TABLET | ORAL | 0 refills | Status: DC

## 2016-08-29 NOTE — Telephone Encounter (Signed)
Called her back- she is feeling very tired and is coughing still Her stomach is sore from coughing so much No fever, chills or aches. The cough is her main sx and she also feels very tired.  She would like to try prednisone which I think is a reasonable next step given her recent CXR results  Dg Chest 2 View  Result Date: 08/27/2016 CLINICAL DATA:  Cough and congestion for 5 days,nonsmoker EXAM: CHEST  2 VIEW COMPARISON:  None. FINDINGS: Biapical pleuroparenchymal scarring appear stable. Airway thickening is present, suggesting bronchitis or reactive airways disease. Cardiac and mediastinal margins appear normal. Atherosclerotic calcification of the aortic arch noted. No significant airspace opacities. Possible right middle lobe subsegmental atelectasis. The possible enchondroma in the right proximal humeral metaphysis. IMPRESSION: 1. Airway thickening is present, suggesting bronchitis or reactive airways disease. 2. Biapical pleuroparenchymal scarring. 3. Subsegmental atelectasis in the right middle lobe. 4. Atherosclerotic calcification of the aortic arch. Electronically Signed   By: Van Clines M.D.   On: 08/27/2016 12:37

## 2016-08-29 NOTE — Telephone Encounter (Signed)
Caller name: Relationship to patient: Self Can be reached: [phone]  Pharmacy:  Reason for call: Patient request call back. States she sent a My Chart message this morning and really needs to speak with her doctor or nurse. Plse adv

## 2016-09-10 ENCOUNTER — Encounter: Payer: Self-pay | Admitting: Family Medicine

## 2016-09-11 ENCOUNTER — Encounter: Payer: Self-pay | Admitting: Medical

## 2016-09-11 ENCOUNTER — Ambulatory Visit (INDEPENDENT_AMBULATORY_CARE_PROVIDER_SITE_OTHER): Payer: Medicare Other | Admitting: Medical

## 2016-09-11 VITALS — BP 102/68 | HR 80 | Temp 98.0°F | Ht 65.0 in | Wt 158.0 lb

## 2016-09-11 DIAGNOSIS — R059 Cough, unspecified: Secondary | ICD-10-CM

## 2016-09-11 DIAGNOSIS — R05 Cough: Secondary | ICD-10-CM | POA: Diagnosis not present

## 2016-09-11 DIAGNOSIS — J209 Acute bronchitis, unspecified: Secondary | ICD-10-CM | POA: Diagnosis not present

## 2016-09-11 MED ORDER — BENZONATATE 100 MG PO CAPS
100.0000 mg | ORAL_CAPSULE | Freq: Three times a day (TID) | ORAL | 0 refills | Status: DC | PRN
Start: 2016-09-11 — End: 2016-09-17

## 2016-09-11 MED ORDER — AZITHROMYCIN 250 MG PO TABS: ORAL_TABLET | ORAL | 0 refills | Status: DC

## 2016-09-11 MED ORDER — PREDNISONE 10 MG PO TABS: ORAL_TABLET | ORAL | 0 refills | Status: DC

## 2016-09-11 NOTE — Progress Notes (Signed)
Pre visit review using our clinic review tool, if applicable. No additional management support is needed unless otherwise documented below in the visit note./hsm  

## 2016-09-11 NOTE — Progress Notes (Signed)
Subjective:    Patient ID: Margaret Graham, female    DOB: [birth year], 70 y.o.   MRN: [id number]  HPI  Pt in for follow up.  Pt states 2 weeks ago got 5 day course of prednisone. Pt states cxr showed some reactive airway disease.   Pt states for 5 days she felt well after treatment. But then got sick again. Cough came back with rare occasional small amount mucous. She wants to get better since family is coming to town next week for thanksgiving.  No fever, no chills or sweats. No history of inhaler use.  Pt was given tussionex for cough. Did not help as nyquil.   Not history of smoking and no second smoke.  Pt states Dr. Lorelei Pont gave prednisone before xray results came back.  Pt notes that if she was given inhalers she would not use.    Review of Systems  Constitutional: Negative for chills, fatigue and fever.  HENT: Negative for congestion, ear discharge, postnasal drip, sinus pain, sinus pressure, sore throat and voice change.   Respiratory: Positive for cough. Negative for chest tightness, shortness of breath and wheezing.        Mild productive cough.  Cardiovascular: Negative for chest pain and palpitations.  Gastrointestinal: Negative for abdominal pain, constipation, diarrhea and nausea.  Musculoskeletal: Negative for back pain.  Neurological: Negative for dizziness, syncope, speech difficulty, weakness and light-headedness.  Hematological: Negative for adenopathy. Does not bruise/bleed easily.  Psychiatric/Behavioral: Negative for behavioral problems and confusion.    Past Medical History:  Diagnosis Date  . Arthritis   . GERD (gastroesophageal reflux disease)   . Heart murmur   . Hyperlipidemia   . Schatzki's ring   . Thyroid disease      Social History   Social History  . Marital status: Married    Spouse name: N/A  . Number of children: N/A  . Years of education: N/A   Occupational History  . Not on file.   Social History Main Topics  . Smoking  status: Never Smoker  . Smokeless tobacco: Never Used  . Alcohol use 0.0 oz/week  . Drug use: No  . Sexual activity: Not Currently    Partners: Male   Other Topics Concern  . Not on file   Social History Narrative   Retired from [employer] to [employer] 07/2015.   From [place].    Past Surgical History:  Procedure Laterality Date  . ABDOMINAL HYSTERECTOMY    . BREAST SURGERY      Family History  Problem Relation Age of Onset  . Arthritis Mother   . Arthritis Father   . Hyperlipidemia Father   . Diabetes Son   . Heart disease Paternal Uncle     Allergies  Allergen Reactions  . Declomycin [Demeclocycline]     Current Outpatient Prescriptions on File Prior to Visit  Medication Sig Dispense Refill  . aspirin 81 MG tablet Take 81 mg by mouth daily.    . Calcium Carb-Cholecalciferol (CALCIUM 600 + D PO) Take by mouth.    . chlorpheniramine-HYDROcodone (TUSSIONEX PENNKINETIC ER) 10-8 MG/5ML SUER Take 5 mLs by mouth every 12 (twelve) hours as needed for cough. 60 mL 0  . Cholecalciferol (VITAMIN D3 PO) Take 800 Int'l Units by mouth.    [name] Kitchen ketoconazole (NIZORAL) 2 % shampoo Apply 1 application topically as needed for irritation.    [name] Kitchen levothyroxine (SYNTHROID, LEVOTHROID) 100 MCG tablet Take 1 tablet (100 mcg total) by mouth every morning.  30 tablet 3  . Multiple Vitamin (MULTIVITAMIN) capsule Take 1 capsule by mouth daily.    [name] Kitchen omeprazole (PRILOSEC) 20 MG capsule Take 1 capsule (20 mg total) by mouth daily. 90 capsule 1  . predniSONE (DELTASONE) 20 MG tablet Take 2 pills daily for 3 days, then 1 pill daily for 3 days 9 tablet 0  . simvastatin (ZOCOR) 20 MG tablet Take 1 tablet (20 mg total) by mouth daily. 90 tablet 0  . triamcinolone cream (KENALOG) 0.1 % Apply 1 application topically as needed.     No current facility-administered medications on file prior to visit.     BP 102/68 (BP Location: Left Arm, Patient Position: Sitting, Cuff Size: Normal)   Pulse 80   Temp 98 F  (36.7 C) (Oral)   Ht 5\' 5"  (1.651 m)   Wt 158 lb (71.7 kg)   SpO2 98%   BMI 26.29 kg/m       Objective:   Physical Exam  General  Mental Status - Alert. General Appearance - Well groomed. Not in acute distress. Very pleasant pt.  Skin Rashes- No Rashes.  HEENT Head- Normal. Ear Auditory Canal - Left- Normal. Right - Normal.Tympanic Membrane- Left- Normal. Right- Normal. Eye Sclera/Conjunctiva- Left- Normal. Right- Normal. Nose & Sinuses Nasal Mucosa- Left-  Boggy and Congested. Right-  Boggy and  Congested.Bilateral no  maxillary and no frontal sinus pressure. Mouth & Throat Lips: Upper Lip- Normal: no dryness, cracking, pallor, cyanosis, or vesicular eruption. Lower Lip-Normal: no dryness, cracking, pallor, cyanosis or vesicular eruption. Buccal Mucosa- Bilateral- No Aphthous ulcers. Oropharynx- No Discharge or Erythema. Tonsils: Characteristics- Bilateral- No Erythema or Congestion. Size/Enlargement- Bilateral- No enlargement. Discharge- bilateral-None.  Neck Neck- Supple. No Masses.   Chest and Lung Exam Auscultation: Breath Sounds:- even and unlabored. Mild shallow respirations but no wheeze.  Cardiovascular Auscultation:Rythm- Regular, rate and rhythm. Murmurs & Other Heart Sounds:Ausculatation of the heart reveal- No Murmurs.  Lymphatic Head & Neck General Head & Neck Lymphatics: Bilateral: Description- No Localized lymphadenopathy.       Assessment & Plan:  You appear to have bronchitis. Rest hydrate and tylenol for fever. I am prescribing benzonatate cough medicine(if nyquil not working), and start azithromycin antibiotic.   If you get any wheezing or severe constant cough then start prednisone taper dose. Making available.  You should gradually get better. If not then notify us and would recommend a chest xray.  Follow up in 7-10 days or as needed  Note I did discuss with pt that I wanted to rx inhalers and use prednisone as last resort but she  mentions she would not use inhalers.  Gorman Safi, Percell Miller, PA-C

## 2016-09-11 NOTE — Patient Instructions (Addendum)
You appear to have bronchitis. Rest hydrate and tylenol for fever. I am prescribing benzonatate cough medicine(if nyquil not working), and start azithromycin antibiotic.   If you get any wheezing or severe constant cough then start prednisone taper dose. Making available.  You should gradually get better. If not then notify us and would recommend a chest xray.  Follow up in 7-10 days or as needed   Note I did discuss with pt that I wanted to rx inhalers and use prednisone as last resort but she mentions she would not use inhalers.

## 2016-09-12 ENCOUNTER — Encounter: Payer: Self-pay | Admitting: Medical

## 2016-09-16 ENCOUNTER — Encounter: Payer: Self-pay | Admitting: Family Medicine

## 2016-09-17 ENCOUNTER — Ambulatory Visit (INDEPENDENT_AMBULATORY_CARE_PROVIDER_SITE_OTHER): Payer: Medicare Other | Admitting: Family Medicine

## 2016-09-17 ENCOUNTER — Encounter: Payer: Self-pay | Admitting: Family Medicine

## 2016-09-17 ENCOUNTER — Ambulatory Visit (HOSPITAL_BASED_OUTPATIENT_CLINIC_OR_DEPARTMENT_OTHER)
Admission: RE | Admit: 2016-09-17 | Discharge: 2016-09-17 | Disposition: A | Discharge status: Home / Self Care | Payer: Medicare Other | Source: Ambulatory Visit | Attending: Family Medicine | Admitting: Family Medicine

## 2016-09-17 VITALS — BP 122/64 | HR 64 | Temp 97.7°F | Ht 65.0 in | Wt 153.8 lb

## 2016-09-17 DIAGNOSIS — R9389 Abnormal findings on diagnostic imaging of other specified body structures: Secondary | ICD-10-CM

## 2016-09-17 DIAGNOSIS — R918 Other nonspecific abnormal finding of lung field: Secondary | ICD-10-CM | POA: Insufficient documentation

## 2016-09-17 DIAGNOSIS — R05 Cough: Secondary | ICD-10-CM | POA: Diagnosis not present

## 2016-09-17 DIAGNOSIS — R053 Chronic cough: Secondary | ICD-10-CM

## 2016-09-17 DIAGNOSIS — R059 Cough, unspecified: Secondary | ICD-10-CM

## 2016-09-17 DIAGNOSIS — J9801 Acute bronchospasm: Secondary | ICD-10-CM

## 2016-09-17 MED ORDER — ALBUTEROL SULFATE 108 (90 BASE) MCG/ACT IN AEPB: 2.0000 | INHALATION_SPRAY | Freq: Four times a day (QID) | RESPIRATORY_TRACT | 2 refills | Status: DC | PRN

## 2016-09-17 MED ORDER — MONTELUKAST SODIUM 10 MG PO TABS: 10.0000 mg | ORAL_TABLET | Freq: Every day | ORAL | 3 refills | Status: DC

## 2016-09-17 MED ORDER — FLUTICASONE PROPIONATE HFA 110 MCG/ACT IN AERO: 1.0000 | INHALATION_SPRAY | Freq: Two times a day (BID) | RESPIRATORY_TRACT | 12 refills | Status: DC

## 2016-09-17 MED ORDER — FLUTICASONE PROPIONATE HFA 110 MCG/ACT IN AERO
1.0000 | INHALATION_SPRAY | Freq: Two times a day (BID) | RESPIRATORY_TRACT | 12 refills | Status: DC
Start: 2016-09-17 — End: 2017-11-22

## 2016-09-17 NOTE — Progress Notes (Signed)
[place] at [facility] [place], [place], [place] [address] [phone]  Date:  09/17/2016   Name:  Margaret Graham   DOB:  1945-12-28   MRN:  [id number]  PCP:  Lamar Blinks, MD    Chief Complaint: Cough (c/o prod cough with yellow mucus, congestion, stuffy ears, fatigue x 1 month. )   History of Present Illness:  Margaret Graham is a 69 y.o. very pleasant female patient who presents with the following:  Here today with persistent illness I saw her on 08/27/16 with cough- we did a CXR which was negative and used tussionex, and also prednisone.   She did get back to normal, but then got sick again within about 10 days.   She returned on 11/14 and saw Percell Miller with recurrent sx- he treated her with a 5 day course of prednisone and a zpack, tessalon perles.    She feels like she is still about the same at she was last week- she feels tired, she is still coughing.  The cough can "feel really deep," she tried some albuterol that her son had for his asthmaand it did seem to help her.   She has not had a fever as far as she knows.   Her ears feel stuffy still No GI symptoms No sneezing No antipyretics used- she is using nyquil at night.  She does not need more tussionex- the nyquil helps her to sleep She did not use her albuterol today  Her husband is doing well from his recent cardiac surgery  Mild abnl cxr from last month as below:  Dg Chest 2 View  Result Date: 08/27/2016 CLINICAL DATA:  Cough and congestion for 5 days,nonsmoker EXAM: CHEST  2 VIEW COMPARISON:  None. FINDINGS: Biapical pleuroparenchymal scarring appear stable. Airway thickening is present, suggesting bronchitis or reactive airways disease. Cardiac and mediastinal margins appear normal. Atherosclerotic calcification of the aortic arch noted. No significant airspace opacities. Possible right middle lobe subsegmental atelectasis. The possible enchondroma in the  right proximal humeral metaphysis. IMPRESSION: 1. Airway thickening is present, suggesting bronchitis or reactive airways disease. 2. Biapical pleuroparenchymal scarring. 3. Subsegmental atelectasis in the right middle lobe. 4. Atherosclerotic calcification of the aortic arch. Electronically Signed   By: Van Clines M.D.   On: 08/27/2016 12:37     Patient Active Problem List   Diagnosis Date Noted  . Osteopenia 01/23/2016  . Dyslipidemia 01/18/2016  . History of esophageal stricture 01/18/2016  . Postoperative hypothyroidism 01/18/2016  . Esophageal reflux 01/18/2016  . Snoring 01/18/2016    Past Medical History:  Diagnosis Date  . Arthritis   . GERD (gastroesophageal reflux disease)   . Heart murmur   . Hyperlipidemia   . Schatzki's ring   . Thyroid disease     Past Surgical History:  Procedure Laterality Date  . ABDOMINAL HYSTERECTOMY    . BREAST SURGERY      Social History  Substance Use Topics  . Smoking status: Never Smoker  . Smokeless tobacco: Never Used  . Alcohol use 0.0 oz/week    Family History  Problem Relation Age of Onset  . Arthritis Mother   . Arthritis Father   . Hyperlipidemia Father   . Diabetes Son   . Heart disease Paternal Uncle     Allergies  Allergen Reactions  . Declomycin [Demeclocycline]     Medication list has been reviewed and updated.  Current Outpatient Prescriptions on File Prior to Visit  Medication Sig Dispense Refill  . aspirin 81 MG tablet Take 81 mg by mouth daily.    . Calcium Carb-Cholecalciferol (CALCIUM 600 + D PO) Take by mouth.    . chlorpheniramine-HYDROcodone (TUSSIONEX PENNKINETIC ER) 10-8 MG/5ML SUER Take 5 mLs by mouth every 12 (twelve) hours as needed for cough. 60 mL 0  . Cholecalciferol (VITAMIN D3 PO) Take 800 Int'l Units by mouth.    [name] Kitchen ketoconazole (NIZORAL) 2 % shampoo Apply 1 application topically as needed for irritation.    [name] Kitchen levothyroxine (SYNTHROID, LEVOTHROID) 100 MCG tablet Take 1 tablet  (100 mcg total) by mouth every morning. 30 tablet 3  . Multiple Vitamin (MULTIVITAMIN) capsule Take 1 capsule by mouth daily.    [name] Kitchen omeprazole (PRILOSEC) 20 MG capsule Take 1 capsule (20 mg total) by mouth daily. 90 capsule 1  . simvastatin (ZOCOR) 20 MG tablet Take 1 tablet (20 mg total) by mouth daily. 90 tablet 0  . triamcinolone cream (KENALOG) 0.1 % Apply 1 application topically as needed.     No current facility-administered medications on file prior to visit.     Review of Systems:  As per HPI- otherwise negative.   Physical Examination: Vitals:   09/17/16 1145  BP: 122/64  Pulse: 64  Temp: 97.7 F (36.5 C)   Vitals:   09/17/16 1145  Weight: 153 lb 12.8 oz (69.8 kg)  Height: 5\' 5"  (1.651 m)   Body mass index is 25.59 kg/m. Ideal Body Weight: Weight in (lb) to have BMI = 25: 149.9  GEN: WDWN, NAD, Non-toxic, A & O x 3, looks well, coughing in room  HEENT: Atraumatic, Normocephalic. Neck supple. No masses, No LAD.  Bilateral TM wnl, oropharynx normal.  PEERL,EOMI.   Removed cerumen from left ear  Ears and Nose: No external deformity. CV: RRR, No M/G/R. No JVD. No thrill. No extra heart sounds. PULM: CTA B, no wheezes, crackles, rhonchi. No retractions. No resp. distress. No accessory muscle use. EXTR: No c/c/e NEURO Normal gait.  PSYCH: Normally interactive. Conversant. Not depressed or anxious appearing.  Calm demeanor.   Assessment and Plan: Bronchospasm - Plan: montelukast (SINGULAIR) 10 MG tablet, Albuterol Sulfate (PROAIR RESPICLICK) 123XX123 (90 Base) MCG/ACT AEPB, fluticasone (FLOVENT HFA) 110 MCG/ACT inhaler  Cough - Plan: DG Chest 2 View  Here today with recurrent/ persistent bronchospasm and cough Will treat with an inhaled steroid, albuterol, add singulair.  Repeat CXR today- results as below. Discussed with pt over mychart- this does not mean that she has cancer but we need to schedule a CT scan.  Will do so asap.  She states understanding  Dg Chest 2  View  Result Date: 09/17/2016 CLINICAL DATA:  Dry cough. EXAM: CHEST  2 VIEW COMPARISON:  08/27/2016 . FINDINGS: Mediastinum hilar structures are normal. Heart size normal. Stable biapical pleural parenchymal thickening consistent with scarring. Stable density in the right mid lung. Although this may be related to scarring nonenhanced chest CT of the chest suggested to exclude mass lesion. No pleural effusion pneumothorax. Heart size normal. Degenerative changes scoliosis thoracic spine IMPRESSION: Persistent density in the right mid lung. Although this may represent a focal site of scarring, a mass lesion cannot be excluded. Nonenhanced chest CT suggested for further evaluation . Electronically Signed   By: Marcello Moores  Register   On: 09/17/2016 12:34   Dg Chest 2 View  Result Date: 08/27/2016 CLINICAL DATA:  Cough and congestion for 5 days,nonsmoker EXAM: CHEST  2 VIEW COMPARISON:  None. FINDINGS: Biapical pleuroparenchymal  scarring appear stable. Airway thickening is present, suggesting bronchitis or reactive airways disease. Cardiac and mediastinal margins appear normal. Atherosclerotic calcification of the aortic arch noted. No significant airspace opacities. Possible right middle lobe subsegmental atelectasis. The possible enchondroma in the right proximal humeral metaphysis. IMPRESSION: 1. Airway thickening is present, suggesting bronchitis or reactive airways disease. 2. Biapical pleuroparenchymal scarring. 3. Subsegmental atelectasis in the right middle lobe. 4. Atherosclerotic calcification of the aortic arch. Electronically Signed   By: Van Clines M.D.   On: 08/27/2016 12:37     Signed Lamar Blinks, MD

## 2016-09-17 NOTE — Patient Instructions (Signed)
Please go downstairs for a chest x-ray- I will be in touch with your results asap Please start on the flovent (inhaled steroid) 1 puff twice a day.  You can also use the albuterol inhaler as needed We will add singulair once a day as well Please keep me posted regarding your progress!

## 2016-09-17 NOTE — Progress Notes (Signed)
Pre visit review using our clinic review tool, if applicable. No additional management support is needed unless otherwise documented below in the visit note. 

## 2016-09-21 ENCOUNTER — Telehealth: Payer: Self-pay | Admitting: Family Medicine

## 2016-09-21 ENCOUNTER — Ambulatory Visit (HOSPITAL_BASED_OUTPATIENT_CLINIC_OR_DEPARTMENT_OTHER)
Admission: RE | Admit: 2016-09-21 | Discharge: 2016-09-21 | Disposition: A | Discharge status: Home / Self Care | Payer: Medicare Other | Source: Ambulatory Visit | Attending: Family Medicine | Admitting: Family Medicine

## 2016-09-21 DIAGNOSIS — R938 Abnormal findings on diagnostic imaging of other specified body structures: Secondary | ICD-10-CM | POA: Insufficient documentation

## 2016-09-21 DIAGNOSIS — R918 Other nonspecific abnormal finding of lung field: Secondary | ICD-10-CM | POA: Diagnosis not present

## 2016-09-21 DIAGNOSIS — R9389 Abnormal findings on diagnostic imaging of other specified body structures: Secondary | ICD-10-CM

## 2016-09-21 DIAGNOSIS — J984 Other disorders of lung: Secondary | ICD-10-CM | POA: Diagnosis not present

## 2016-09-21 DIAGNOSIS — R911 Solitary pulmonary nodule: Secondary | ICD-10-CM | POA: Diagnosis not present

## 2016-09-21 DIAGNOSIS — I700 Atherosclerosis of aorta: Secondary | ICD-10-CM | POA: Insufficient documentation

## 2016-09-21 NOTE — Telephone Encounter (Signed)
Called pt to go over chest CT- LMOM.  Happy to report that she does not appear to have lung cancer, most likely scarring.  They have recommended a repeat scan in 3 months that I will order for her.  Let me know if any questions   IMPRESSION: 12 mm irregular nodular area in the right middle lobe with adjacent scarring. This could reflect an area of nodular scarring. Consider following in 3 months for both low-risk and high-risk individuals with repeat chest CT.  Biapical scarring.  Scattered aortic atherosclerosis

## 2016-09-25 NOTE — Addendum Note (Signed)
Addended by: Lamar Blinks C on: 09/25/2016 01:56 PM   Modules accepted: Orders

## 2016-09-27 DIAGNOSIS — J309 Allergic rhinitis, unspecified: Secondary | ICD-10-CM | POA: Diagnosis not present

## 2016-09-27 DIAGNOSIS — R05 Cough: Secondary | ICD-10-CM | POA: Diagnosis not present

## 2016-09-27 DIAGNOSIS — K219 Gastro-esophageal reflux disease without esophagitis: Secondary | ICD-10-CM | POA: Diagnosis not present

## 2016-09-28 ENCOUNTER — Encounter: Payer: Self-pay | Admitting: Gastroenterology

## 2016-10-24 ENCOUNTER — Encounter: Payer: Self-pay | Admitting: Family Medicine

## 2016-10-24 ENCOUNTER — Other Ambulatory Visit: Payer: Self-pay | Admitting: Family Medicine

## 2016-10-24 MED ORDER — SIMVASTATIN 20 MG PO TABS: 20.0000 mg | ORAL_TABLET | Freq: Every day | ORAL | 0 refills | Status: AC

## 2016-11-02 ENCOUNTER — Encounter: Payer: Self-pay | Admitting: Family Medicine

## 2016-11-02 ENCOUNTER — Telehealth: Payer: Self-pay | Admitting: Emergency Medicine

## 2016-11-02 NOTE — Telephone Encounter (Signed)
Called pt back. Apologized for taking so long getting back to pt. Informed pt that provider does not work on Fridays and will be back on Monday. Informed pt that if she feels her sx's are bad enough she could go to Urgent Care to be seen. Pt states that she will go to Urgent Care first thing in the morning to be seen. Asked pt to call the office back Monday if sx's have worsened or have not improved after being seen at Urgent Care. Pt verbalized understanding.

## 2016-11-03 DIAGNOSIS — R062 Wheezing: Secondary | ICD-10-CM | POA: Diagnosis not present

## 2016-11-03 DIAGNOSIS — R05 Cough: Secondary | ICD-10-CM | POA: Diagnosis not present

## 2016-11-23 NOTE — Telephone Encounter (Signed)
Done

## 2016-12-06 DIAGNOSIS — H6502 Acute serous otitis media, left ear: Secondary | ICD-10-CM | POA: Diagnosis not present

## 2016-12-27 ENCOUNTER — Encounter: Payer: Self-pay | Admitting: Family Medicine

## 2017-01-14 DIAGNOSIS — H04123 Dry eye syndrome of bilateral lacrimal glands: Secondary | ICD-10-CM | POA: Diagnosis not present

## 2017-01-21 DIAGNOSIS — E039 Hypothyroidism, unspecified: Secondary | ICD-10-CM | POA: Diagnosis not present

## 2017-01-21 DIAGNOSIS — M8580 Other specified disorders of bone density and structure, unspecified site: Secondary | ICD-10-CM | POA: Diagnosis not present

## 2017-01-21 DIAGNOSIS — Z0000 Encounter for general adult medical examination without abnormal findings: Secondary | ICD-10-CM | POA: Diagnosis not present

## 2017-01-21 DIAGNOSIS — E782 Mixed hyperlipidemia: Secondary | ICD-10-CM | POA: Diagnosis not present

## 2017-01-21 DIAGNOSIS — R911 Solitary pulmonary nodule: Secondary | ICD-10-CM | POA: Diagnosis not present

## 2017-01-28 DIAGNOSIS — R911 Solitary pulmonary nodule: Secondary | ICD-10-CM | POA: Diagnosis not present

## 2017-01-30 DIAGNOSIS — R918 Other nonspecific abnormal finding of lung field: Secondary | ICD-10-CM | POA: Diagnosis not present

## 2017-02-04 DIAGNOSIS — H04123 Dry eye syndrome of bilateral lacrimal glands: Secondary | ICD-10-CM | POA: Diagnosis not present

## 2017-03-06 DIAGNOSIS — L570 Actinic keratosis: Secondary | ICD-10-CM | POA: Diagnosis not present

## 2017-03-06 DIAGNOSIS — L564 Polymorphous light eruption: Secondary | ICD-10-CM | POA: Diagnosis not present

## 2017-03-06 DIAGNOSIS — L821 Other seborrheic keratosis: Secondary | ICD-10-CM | POA: Diagnosis not present

## 2017-03-06 DIAGNOSIS — D225 Melanocytic nevi of trunk: Secondary | ICD-10-CM | POA: Diagnosis not present

## 2017-03-06 DIAGNOSIS — L218 Other seborrheic dermatitis: Secondary | ICD-10-CM | POA: Diagnosis not present

## 2017-03-11 DIAGNOSIS — Z1231 Encounter for screening mammogram for malignant neoplasm of breast: Secondary | ICD-10-CM | POA: Diagnosis not present

## 2017-03-13 DIAGNOSIS — L570 Actinic keratosis: Secondary | ICD-10-CM | POA: Diagnosis not present

## 2017-03-18 DIAGNOSIS — M8580 Other specified disorders of bone density and structure, unspecified site: Secondary | ICD-10-CM | POA: Diagnosis not present

## 2017-03-18 DIAGNOSIS — E039 Hypothyroidism, unspecified: Secondary | ICD-10-CM | POA: Diagnosis not present

## 2017-03-18 DIAGNOSIS — E782 Mixed hyperlipidemia: Secondary | ICD-10-CM | POA: Diagnosis not present

## 2017-03-27 IMAGING — CR DG CHEST 2V
2 series · 2 of 2 positions shown · non-contrast
Comparison: None.

CLINICAL DATA: Cough and congestion for 5 days,nonsmoker

EXAM:
CHEST  2 VIEW

[w chest pa]
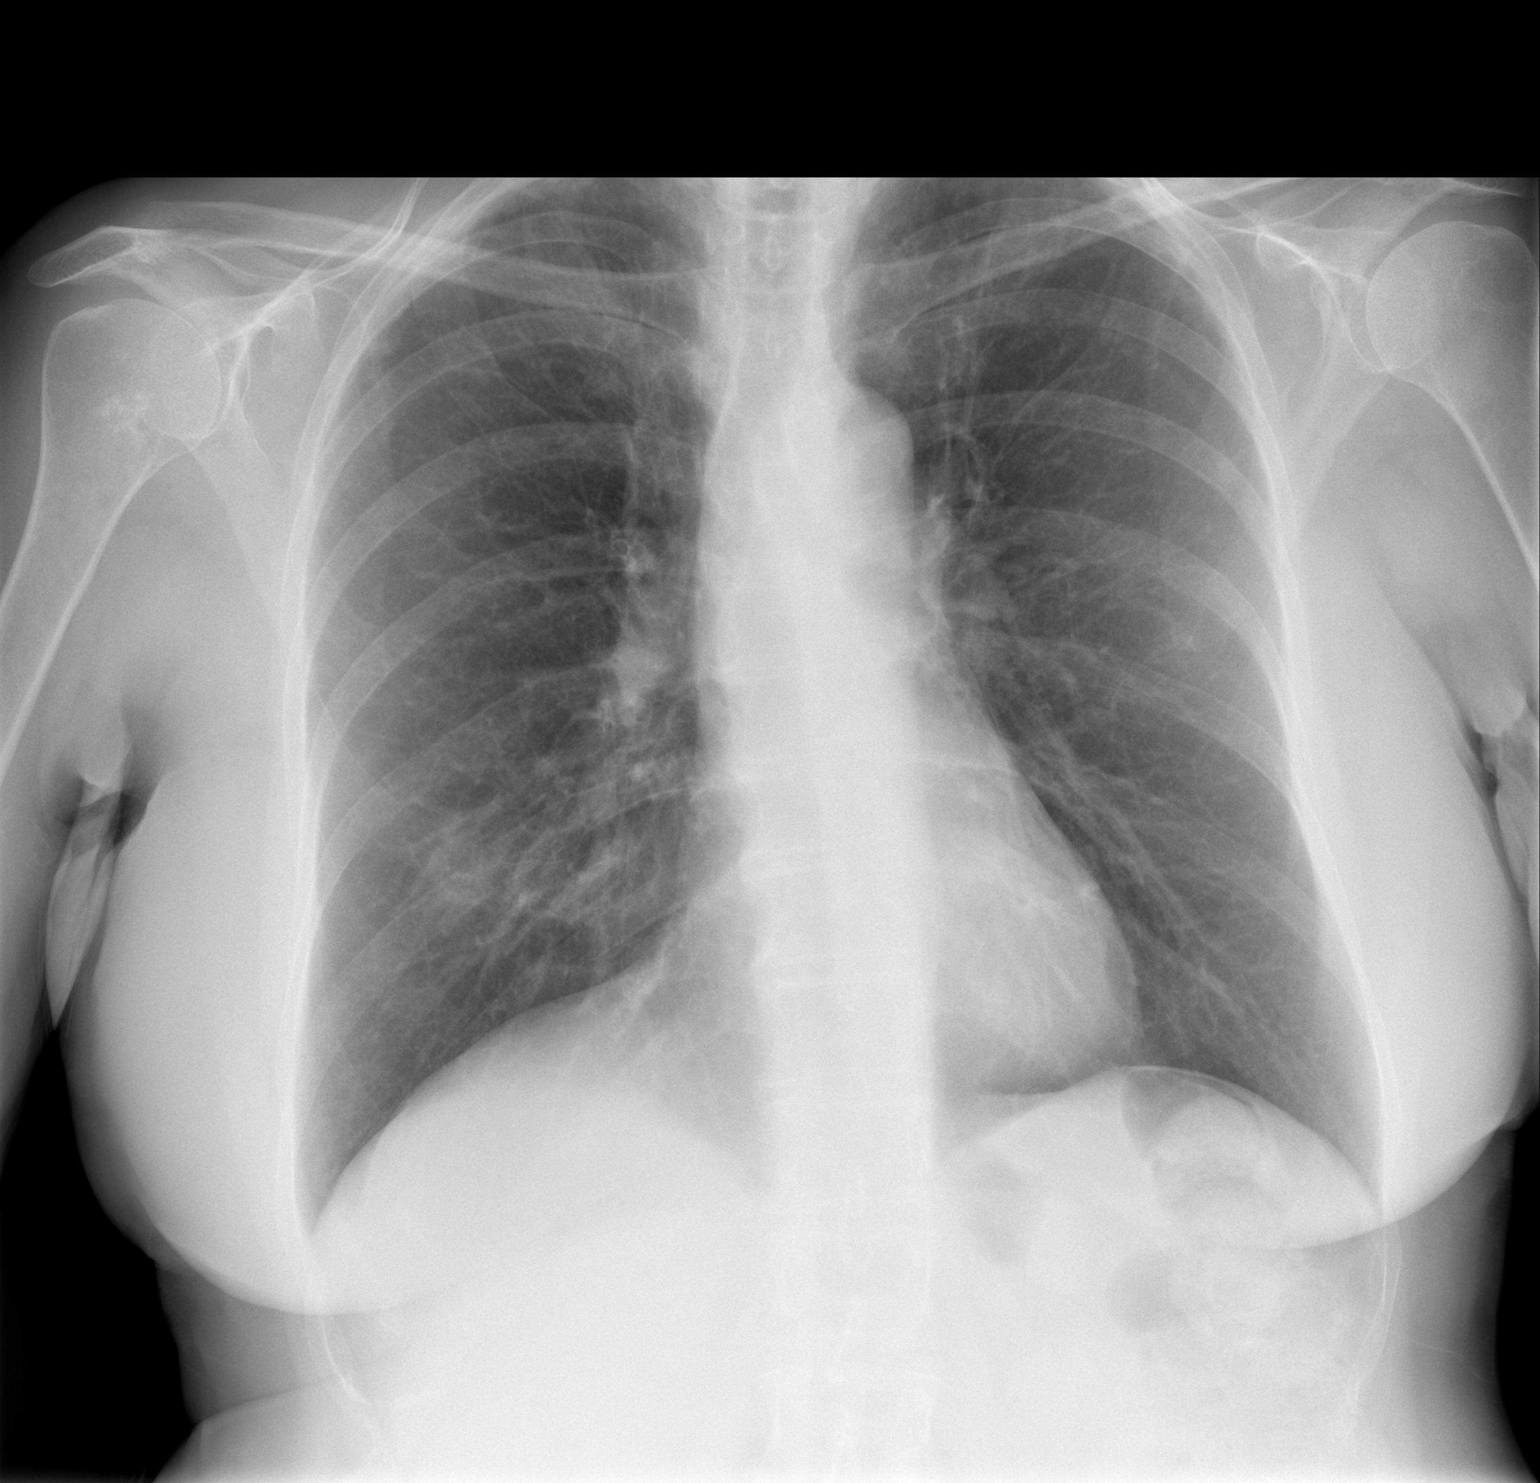

[w chest lat]
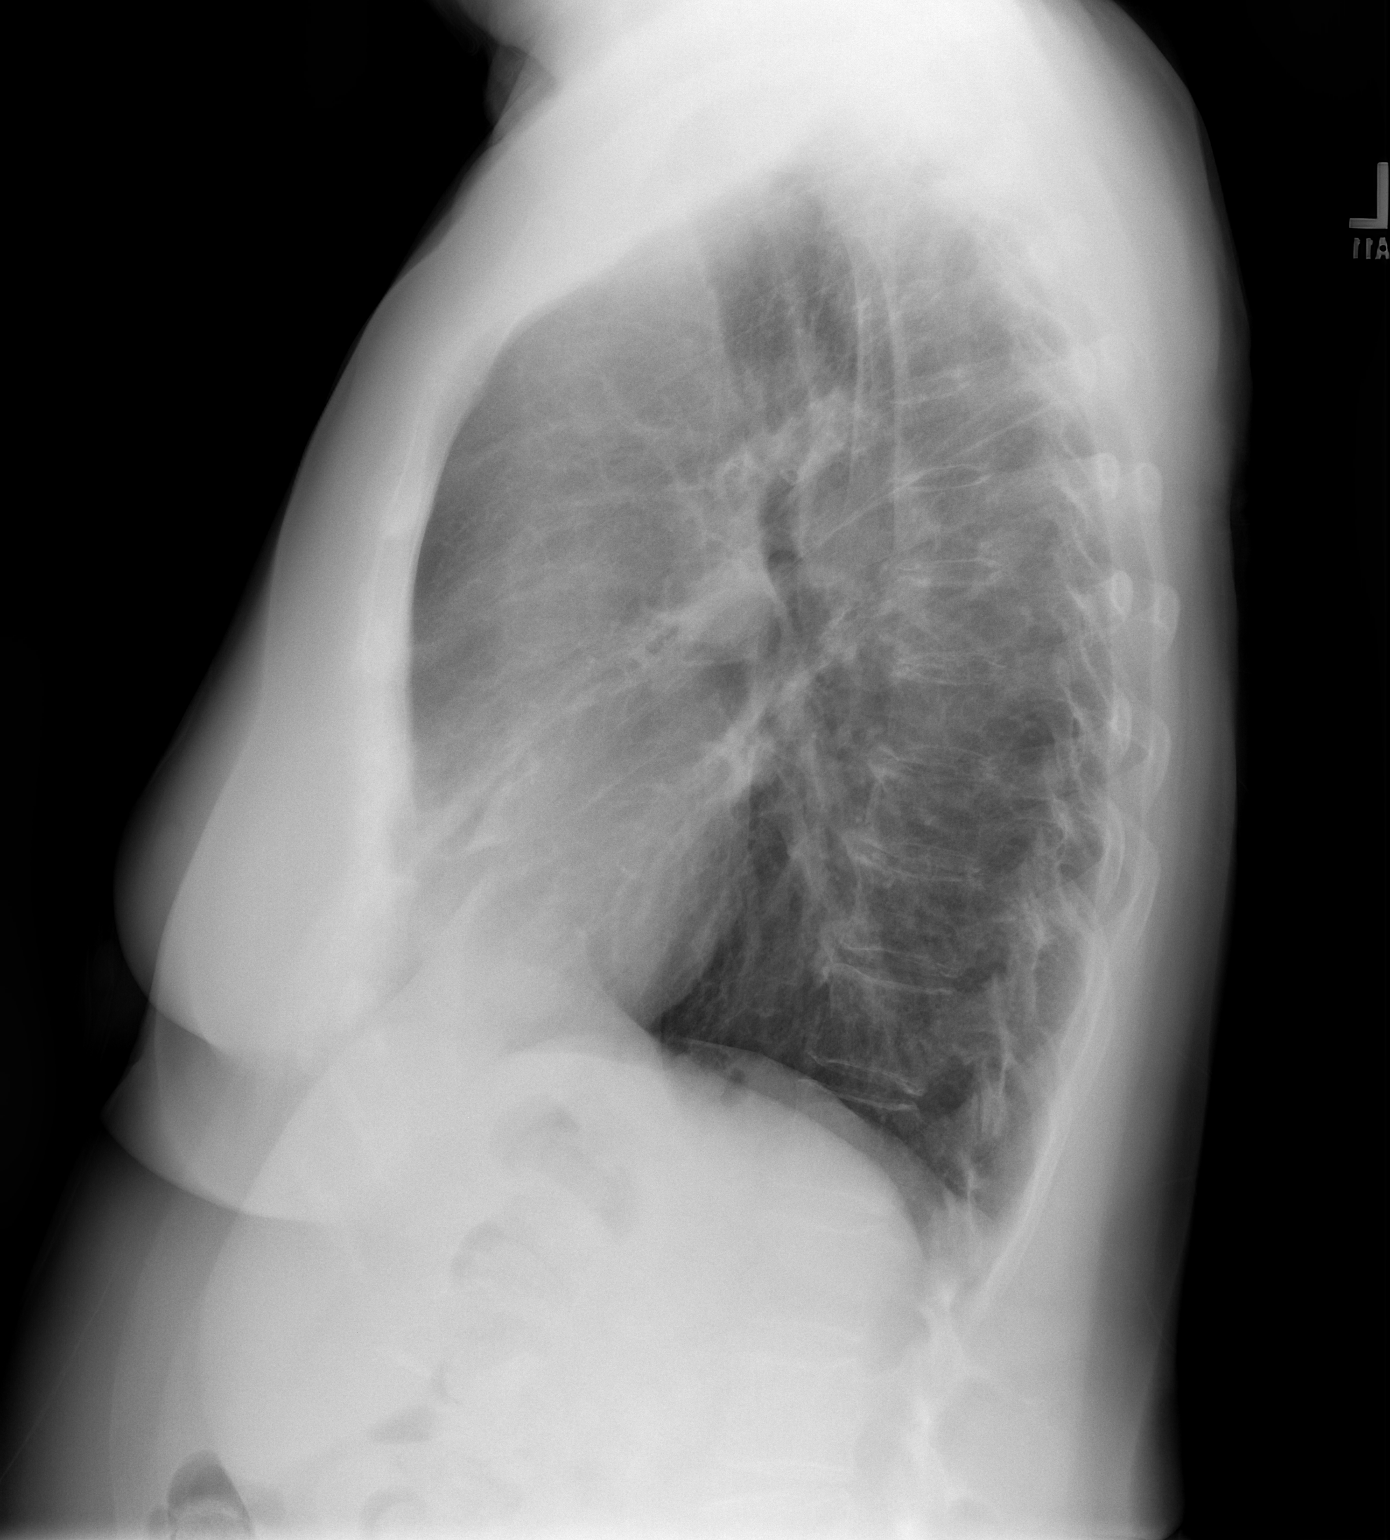

[2 of 2 positions shown; findings below may reference images not displayed]

FINDINGS: Biapical pleuroparenchymal scarring appear stable.

Airway thickening is present, suggesting bronchitis or reactive
airways disease. Cardiac and mediastinal margins appear normal.
Atherosclerotic calcification of the aortic arch noted.

No significant airspace opacities. Possible right middle lobe
subsegmental atelectasis. The possible enchondroma in the right
proximal humeral metaphysis.
IMPRESSION: 1. Airway thickening is present, suggesting bronchitis or reactive
airways disease.
2. Biapical pleuroparenchymal scarring.
3. Subsegmental atelectasis in the right middle lobe.
4. Atherosclerotic calcification of the aortic arch.

## 2017-04-08 DIAGNOSIS — E038 Other specified hypothyroidism: Secondary | ICD-10-CM | POA: Diagnosis not present

## 2017-04-08 DIAGNOSIS — E041 Nontoxic single thyroid nodule: Secondary | ICD-10-CM | POA: Diagnosis not present

## 2017-04-08 DIAGNOSIS — E784 Other hyperlipidemia: Secondary | ICD-10-CM | POA: Diagnosis not present

## 2017-04-08 DIAGNOSIS — Z1389 Encounter for screening for other disorder: Secondary | ICD-10-CM | POA: Diagnosis not present

## 2017-04-08 DIAGNOSIS — M810 Age-related osteoporosis without current pathological fracture: Secondary | ICD-10-CM | POA: Diagnosis not present

## 2017-04-08 DIAGNOSIS — Z6826 Body mass index (BMI) 26.0-26.9, adult: Secondary | ICD-10-CM | POA: Diagnosis not present

## 2017-04-12 ENCOUNTER — Other Ambulatory Visit: Payer: Self-pay | Admitting: Endocrinology

## 2017-04-12 DIAGNOSIS — E041 Nontoxic single thyroid nodule: Secondary | ICD-10-CM

## 2017-04-17 ENCOUNTER — Other Ambulatory Visit: Payer: Medicare Other

## 2017-04-21 IMAGING — CT CT CHEST W/O CM
2 of 3 series · 15 of 36 positions shown, 18 images · non-contrast
Comparison: Chest x-ray 09/17/2016

CLINICAL DATA: Abnormal chest x-ray

EXAM:
CT CHEST WITHOUT CONTRAST
TECHNIQUE: Multidetector CT imaging of the chest was performed following the
standard protocol without IV contrast.

[Series 2: thorax · axial · 0.64mm/px · z∈[+950,+1204]mm · 12 of 149 slices shown, 15 images]
[im 11/149  mediastinal]
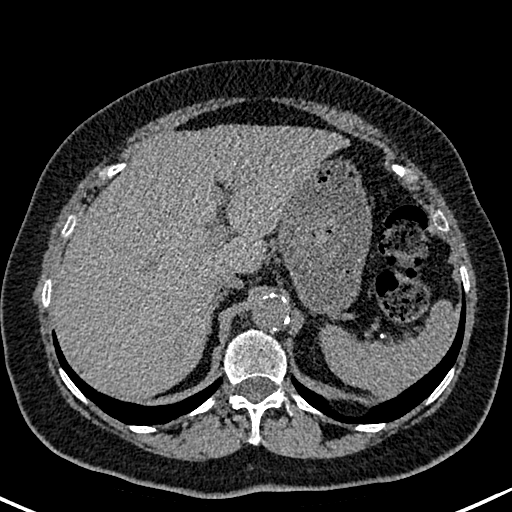
[im 11/149  lung]
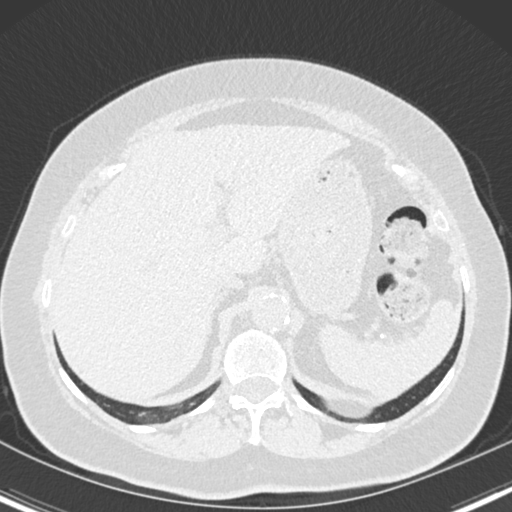
[im 22/149  lung]
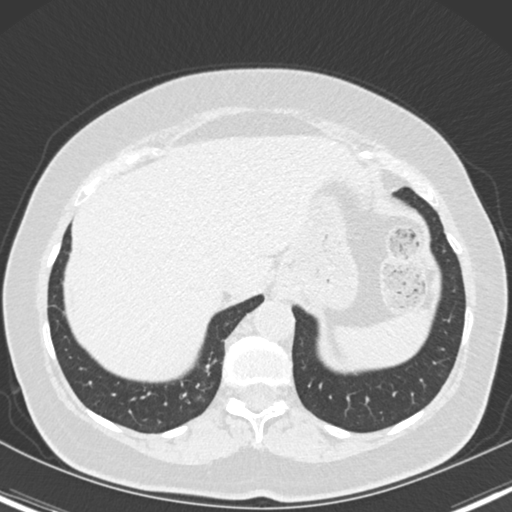
[im 33/149  lung]
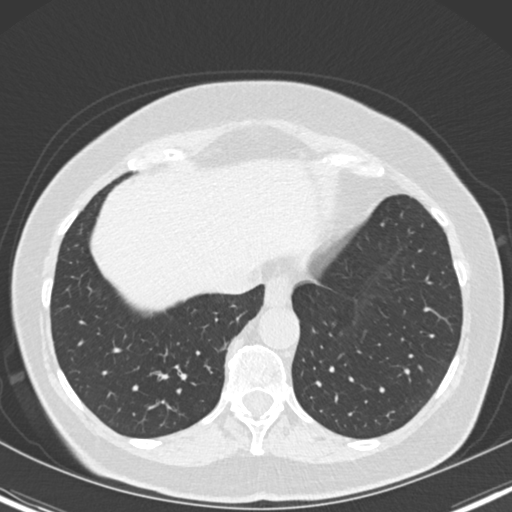
[im 44/149  lung]
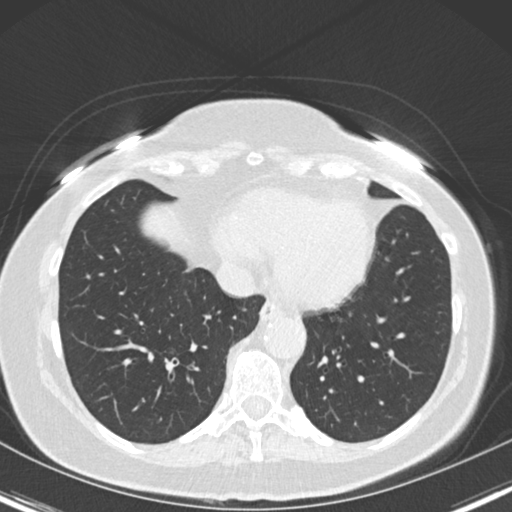
[im 55/149  mediastinal]
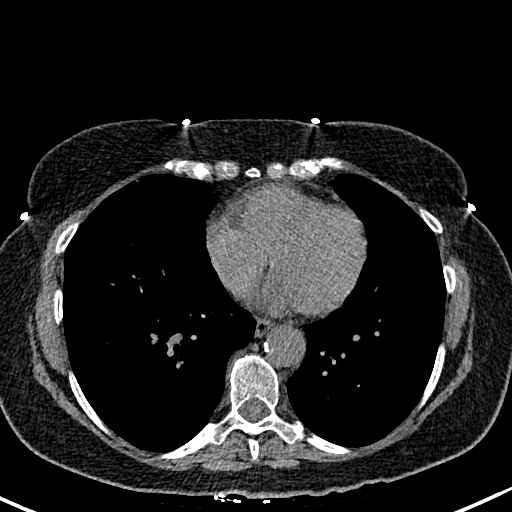
[im 55/149  lung]
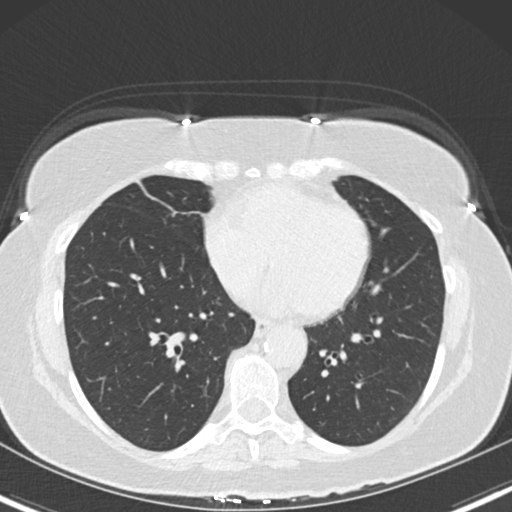
[im 66/149  lung]
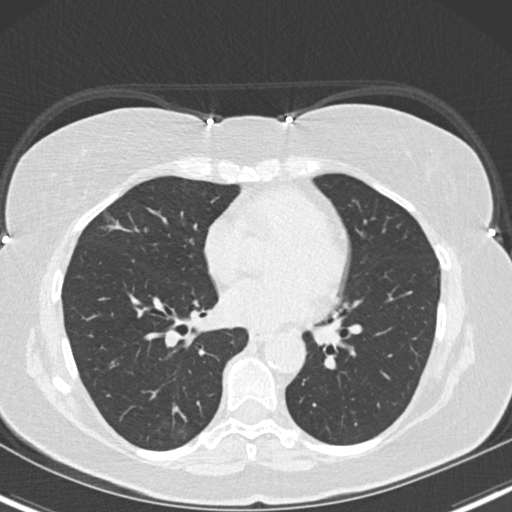
[im 83/149  lung]
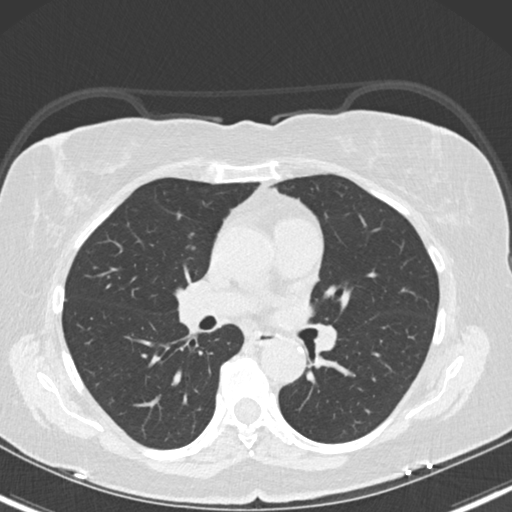
[im 94/149  lung]
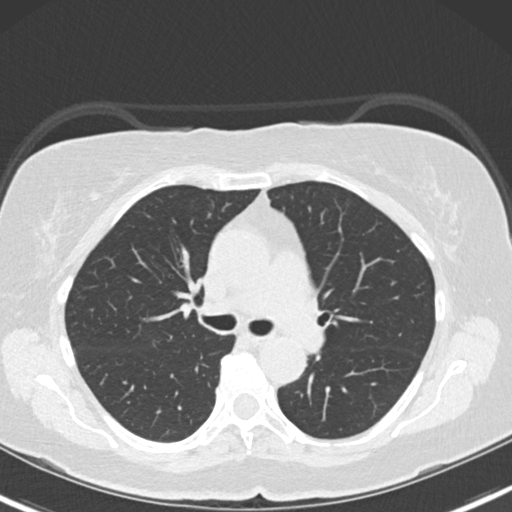
[im 105/149  mediastinal]
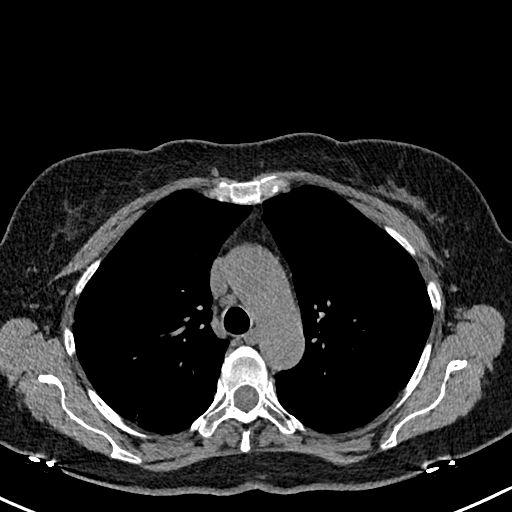
[im 105/149  lung]
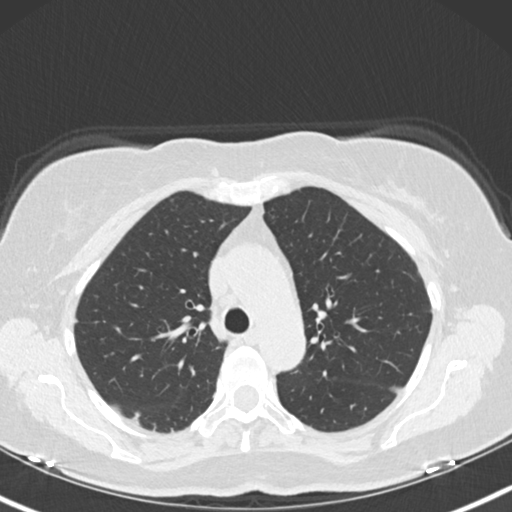
[im 116/149  lung]
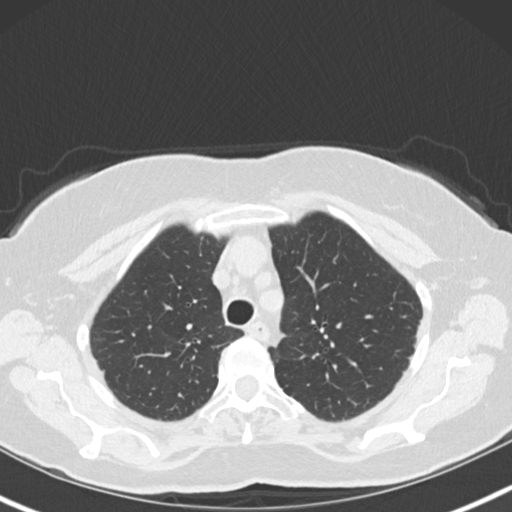
[im 127/149  lung]
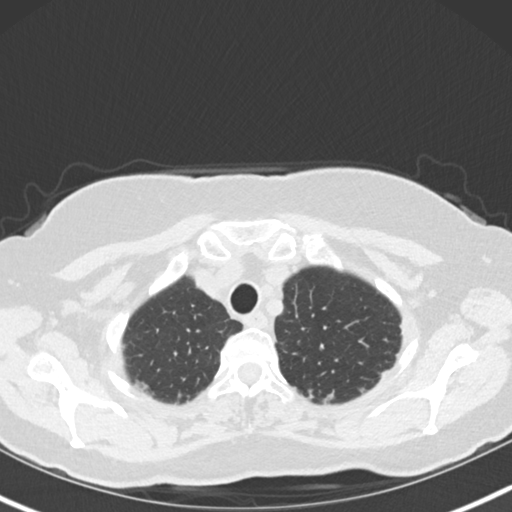
[im 138/149  lung]
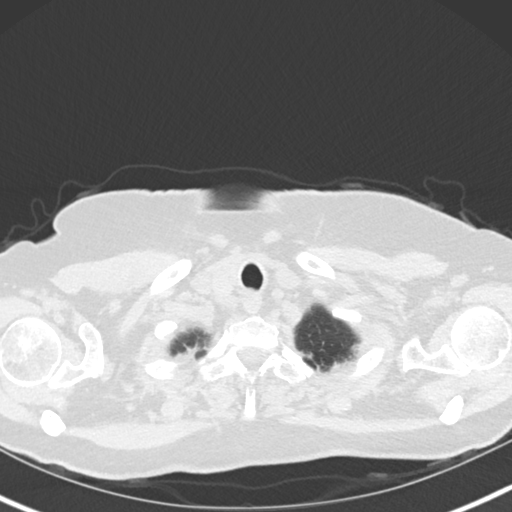

[Series 5: coronal · coronal · 0.59mm/px · 3 of 101 slices shown]
[im 21/101  lung]
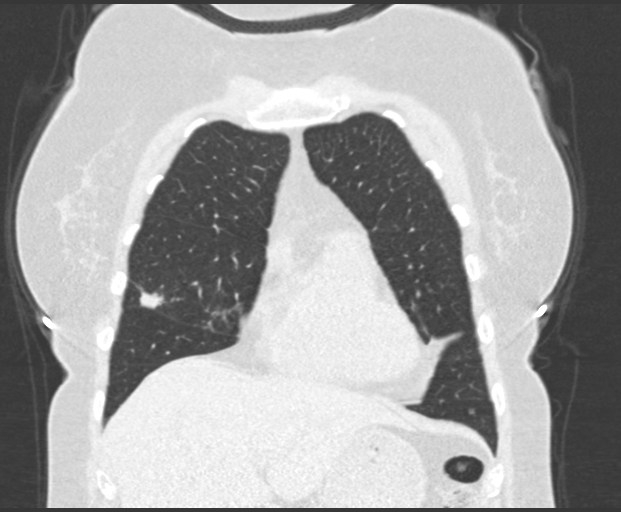
[im 41/101  lung]
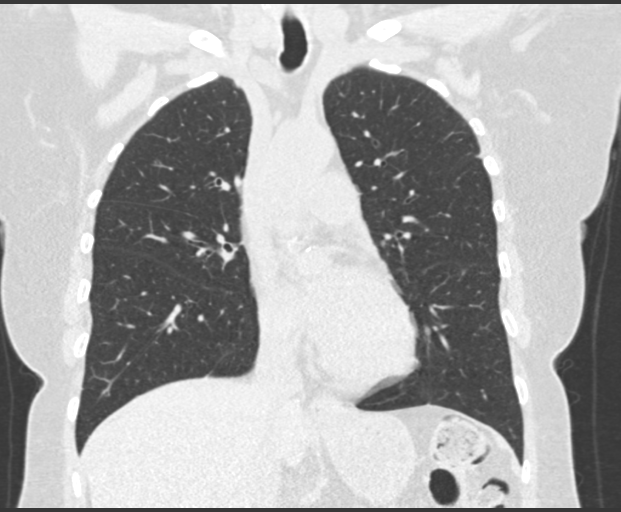
[im 61/101  lung]
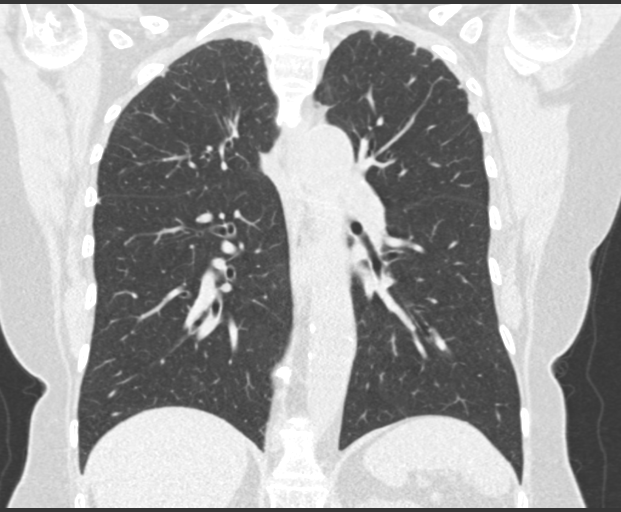

[15 of 36 positions shown; findings below may reference images not displayed]

FINDINGS: Cardiovascular: Scattered aortic calcifications. No aneurysm. Heart
is normal size.

Mediastinum/Nodes: No mediastinal, hilar, or axillary adenopathy.
Probable posterior right thyroid lobe nodule, 1.6 cm. Small hiatal
hernia.

Lungs/Pleura: Biapical scarring. Irregular density noted in the
right middle lobe on image 89 measuring up to 12 mm. Linear areas of
scarring in the adjacent right middle lobe. No other suspicious
nodular areas. No pleural effusions.

Upper Abdomen: Imaging into the upper abdomen shows no acute
findings.

Musculoskeletal: Chest wall soft tissues are unremarkable. No acute
bony abnormality or focal bone lesion.
IMPRESSION: 12 mm irregular nodular area in the right middle lobe with adjacent
scarring. This could reflect an area of nodular scarring. Consider
following in 3 months for both low-risk and high-risk individuals
with repeat chest CT.

Biapical scarring.

Scattered aortic atherosclerosis.

## 2017-04-23 ENCOUNTER — Ambulatory Visit
Admission: RE | Admit: 2017-04-23 | Discharge: 2017-04-23 | Disposition: A | Discharge status: Home / Self Care | Payer: Medicare Other | Source: Ambulatory Visit | Attending: Endocrinology | Admitting: Endocrinology

## 2017-04-23 DIAGNOSIS — E041 Nontoxic single thyroid nodule: Secondary | ICD-10-CM

## 2017-05-20 DIAGNOSIS — H2513 Age-related nuclear cataract, bilateral: Secondary | ICD-10-CM | POA: Diagnosis not present

## 2017-05-24 ENCOUNTER — Ambulatory Visit: Payer: Medicare Other | Admitting: Internal Medicine

## 2017-05-24 DIAGNOSIS — K219 Gastro-esophageal reflux disease without esophagitis: Secondary | ICD-10-CM | POA: Diagnosis not present

## 2017-05-24 DIAGNOSIS — R911 Solitary pulmonary nodule: Secondary | ICD-10-CM | POA: Diagnosis not present

## 2017-05-24 DIAGNOSIS — D473 Essential (hemorrhagic) thrombocythemia: Secondary | ICD-10-CM | POA: Diagnosis not present

## 2017-05-24 DIAGNOSIS — M8580 Other specified disorders of bone density and structure, unspecified site: Secondary | ICD-10-CM | POA: Diagnosis not present

## 2017-05-24 DIAGNOSIS — E039 Hypothyroidism, unspecified: Secondary | ICD-10-CM | POA: Diagnosis not present

## 2017-05-24 DIAGNOSIS — Z1211 Encounter for screening for malignant neoplasm of colon: Secondary | ICD-10-CM | POA: Diagnosis not present

## 2017-05-24 DIAGNOSIS — E781 Pure hyperglyceridemia: Secondary | ICD-10-CM | POA: Diagnosis not present

## 2017-05-24 DIAGNOSIS — E782 Mixed hyperlipidemia: Secondary | ICD-10-CM | POA: Diagnosis not present

## 2017-05-24 DIAGNOSIS — Z136 Encounter for screening for cardiovascular disorders: Secondary | ICD-10-CM | POA: Diagnosis not present

## 2017-05-24 DIAGNOSIS — E041 Nontoxic single thyroid nodule: Secondary | ICD-10-CM | POA: Diagnosis not present

## 2017-05-26 DIAGNOSIS — Z136 Encounter for screening for cardiovascular disorders: Secondary | ICD-10-CM | POA: Diagnosis not present

## 2017-06-06 DIAGNOSIS — E041 Nontoxic single thyroid nodule: Secondary | ICD-10-CM | POA: Diagnosis not present

## 2017-06-06 DIAGNOSIS — E781 Pure hyperglyceridemia: Secondary | ICD-10-CM | POA: Diagnosis not present

## 2017-06-06 DIAGNOSIS — E782 Mixed hyperlipidemia: Secondary | ICD-10-CM | POA: Diagnosis not present

## 2017-06-06 DIAGNOSIS — E039 Hypothyroidism, unspecified: Secondary | ICD-10-CM | POA: Diagnosis not present

## 2017-06-06 DIAGNOSIS — D473 Essential (hemorrhagic) thrombocythemia: Secondary | ICD-10-CM | POA: Diagnosis not present

## 2017-06-06 DIAGNOSIS — M8580 Other specified disorders of bone density and structure, unspecified site: Secondary | ICD-10-CM | POA: Diagnosis not present

## 2017-07-06 ENCOUNTER — Encounter: Payer: Self-pay | Admitting: Family Medicine

## 2017-07-19 DIAGNOSIS — H04123 Dry eye syndrome of bilateral lacrimal glands: Secondary | ICD-10-CM | POA: Diagnosis not present

## 2017-08-16 DIAGNOSIS — H04123 Dry eye syndrome of bilateral lacrimal glands: Secondary | ICD-10-CM | POA: Diagnosis not present

## 2017-08-21 DIAGNOSIS — Z23 Encounter for immunization: Secondary | ICD-10-CM | POA: Diagnosis not present

## 2017-10-17 DIAGNOSIS — Z6825 Body mass index (BMI) 25.0-25.9, adult: Secondary | ICD-10-CM | POA: Diagnosis not present

## 2017-10-17 DIAGNOSIS — E038 Other specified hypothyroidism: Secondary | ICD-10-CM | POA: Diagnosis not present

## 2017-10-17 DIAGNOSIS — E7849 Other hyperlipidemia: Secondary | ICD-10-CM | POA: Diagnosis not present

## 2017-10-17 DIAGNOSIS — M810 Age-related osteoporosis without current pathological fracture: Secondary | ICD-10-CM | POA: Diagnosis not present

## 2017-10-17 DIAGNOSIS — I700 Atherosclerosis of aorta: Secondary | ICD-10-CM | POA: Diagnosis not present

## 2017-10-17 DIAGNOSIS — Z1389 Encounter for screening for other disorder: Secondary | ICD-10-CM | POA: Diagnosis not present

## 2017-10-17 DIAGNOSIS — E041 Nontoxic single thyroid nodule: Secondary | ICD-10-CM | POA: Diagnosis not present

## 2017-10-17 DIAGNOSIS — J984 Other disorders of lung: Secondary | ICD-10-CM | POA: Diagnosis not present

## 2017-11-05 ENCOUNTER — Telehealth: Payer: Self-pay | Admitting: Gastroenterology

## 2017-11-05 NOTE — Telephone Encounter (Signed)
Spoke to patient today, for last 4-6 weeks dealing with constipation. Has been using bisacodyl tablets. Suggested she try Miralax once a day, making sure to get enough water. I have also scheduled her to be seen sooner by APP, have canceled her appointment with Dr. Loletha Carrow on 2/4. She is going out of town on 1/19 and was anxious about going away when not feeling good.

## 2017-11-07 ENCOUNTER — Other Ambulatory Visit (INDEPENDENT_AMBULATORY_CARE_PROVIDER_SITE_OTHER): Payer: Medicare Other

## 2017-11-07 ENCOUNTER — Ambulatory Visit (INDEPENDENT_AMBULATORY_CARE_PROVIDER_SITE_OTHER): Payer: Medicare Other | Admitting: Physician Assistant

## 2017-11-07 ENCOUNTER — Encounter: Payer: Self-pay | Admitting: Physician Assistant

## 2017-11-07 VITALS — BP 108/74 | HR 70 | Ht 65.0 in | Wt 158.5 lb

## 2017-11-07 DIAGNOSIS — R1084 Generalized abdominal pain: Secondary | ICD-10-CM

## 2017-11-07 DIAGNOSIS — K5900 Constipation, unspecified: Secondary | ICD-10-CM | POA: Diagnosis not present

## 2017-11-07 DIAGNOSIS — R194 Change in bowel habit: Secondary | ICD-10-CM | POA: Diagnosis not present

## 2017-11-07 LAB — CBC WITH DIFFERENTIAL/PLATELET
Basophils Absolute: 0.1 10*3/uL (ref 0.0–0.1)
Basophils Relative: 1.2 % (ref 0.0–3.0)
EOS ABS: 0.2 10*3/uL (ref 0.0–0.7)
EOS PCT: 1.9 % (ref 0.0–5.0)
HCT: 42.5 % (ref 36.0–46.0)
HEMOGLOBIN: 14 g/dL (ref 12.0–15.0)
Lymphocytes Relative: 24 % (ref 12.0–46.0)
Lymphs Abs: 2.3 10*3/uL (ref 0.7–4.0)
MCHC: 32.8 g/dL (ref 30.0–36.0)
MCV: 89.9 fl (ref 78.0–100.0)
MONO ABS: 1.1 10*3/uL — AB (ref 0.1–1.0)
Monocytes Relative: 11.2 % (ref 3.0–12.0)
Neutro Abs: 6 10*3/uL (ref 1.4–7.7)
Neutrophils Relative %: 61.7 % (ref 43.0–77.0)
Platelets: 458 10*3/uL — ABNORMAL HIGH (ref 150.0–400.0)
RBC: 4.73 Mil/uL (ref 3.87–5.11)
RDW: 13.4 % (ref 11.5–15.5)
WBC: 9.7 10*3/uL (ref 4.0–10.5)

## 2017-11-07 LAB — SEDIMENTATION RATE: Sed Rate: 30 mm/hr (ref 0–30)

## 2017-11-07 NOTE — Progress Notes (Signed)
Thank you for sending this case to me. I have reviewed the entire note, and the outlined plan seems appropriate.   Lachlan Mckim Danis, MD  

## 2017-11-07 NOTE — Progress Notes (Signed)
Subjective:    Patient ID: Margaret Graham Date, female    DOB: [birth year], 72 y.o.   MRN: [id number]  HPI Margaret Graham is a pleasant 72 year old white female, established with Dr. Loletha Graham who was last seen in the office in May 2017. She has history of GERD, Schatzki's ring for which she had undergone previous endoscopy and dilation in June 2016 while living in [place]. She also had colonoscopy repair in 2015 for screening with finding of diverticulosis, no polyps. Patient comes in today with acute change in her bowel habits. She says she has always been very regular having one or 2 bowel movements every day. Now over the past 6 weeks or so she has had a change with significant constipation. She says she got very constipated around Christmas time and wound up taking Dulcolax which did purge her bowel but also caused abdominal cramping and diarrhea. She became constipated again after that and says she's generally been feeling "lousy" and uncomfortable in her abdomen , though not really having any pain. She has not had any rectal bleeding. No fever or chills. She has been able to eat without difficulty. She called here earlier this week and was advised to start on MiraLAX once daily. After 1 dose she had a bowel movement yesterday and then today had a small bowel movement but did not feel this helped her symptoms. She complains of some bloating malaise crampiness, and also says she feels that her stools have been narrower over the past few months. She has never been treated for diverticulitis.  Review of Systems Pertinent positive and negative review of systems were noted in the above HPI section.  All other review of systems was otherwise negative.  Outpatient Encounter Medications as of 11/07/2017  Medication Sig  . aspirin 81 MG tablet Take 81 mg by mouth daily.  . Calcium Carb-Cholecalciferol (CALCIUM 600 + D PO) Take by mouth.  . famotidine (PEPCID) 40 MG tablet Take 40 mg by mouth daily.  [name] Kitchen  levothyroxine (SYNTHROID, LEVOTHROID) 100 MCG tablet Take 1 tablet (100 mcg total) by mouth every morning.  . Multiple Vitamin (MULTIVITAMIN) capsule Take 1 capsule by mouth daily.  . simvastatin (ZOCOR) 20 MG tablet Take 1 tablet (20 mg total) by mouth daily.  . Albuterol Sulfate (PROAIR RESPICLICK) 497 (90 Base) MCG/ACT AEPB Inhale 2 puffs into the lungs every 6 (six) hours as needed. (Patient not taking: Reported on 11/07/2017)  . chlorpheniramine-HYDROcodone (TUSSIONEX PENNKINETIC ER) 10-8 MG/5ML SUER Take 5 mLs by mouth every 12 (twelve) hours as needed for cough. (Patient not taking: Reported on 11/07/2017)  . Cholecalciferol (VITAMIN D3 PO) Take 800 Int'l Units by mouth.  . fluticasone (FLOVENT HFA) 110 MCG/ACT inhaler Inhale 1 puff into the lungs 2 (two) times daily. (Patient not taking: Reported on 11/07/2017)  . fluticasone (FLOVENT HFA) 110 MCG/ACT inhaler Inhale 1 puff into the lungs 2 (two) times daily. (Patient not taking: Reported on 11/07/2017)  . ketoconazole (NIZORAL) 2 % shampoo Apply 1 application topically as needed for irritation.  . montelukast (SINGULAIR) 10 MG tablet Take 1 tablet (10 mg total) by mouth at bedtime. (Patient not taking: Reported on 11/07/2017)  . omeprazole (PRILOSEC) 20 MG capsule Take 1 capsule (20 mg total) by mouth daily. (Patient not taking: Reported on 11/07/2017)  . triamcinolone cream (KENALOG) 0.1 % Apply 1 application topically as needed.   No facility-administered encounter medications on file as of 11/07/2017.    Allergies  Allergen Reactions  . Declomycin [Demeclocycline]  Patient Active Problem List   Diagnosis Date Noted  . Osteopenia 01/23/2016  . Dyslipidemia 01/18/2016  . History of esophageal stricture 01/18/2016  . Postoperative hypothyroidism 01/18/2016  . Esophageal reflux 01/18/2016  . Snoring 01/18/2016   Social History   Socioeconomic History  . Marital status: Married    Spouse name: Not on file  . Number of children: Not  on file  . Years of education: Not on file  . Highest education level: Not on file  Social Needs  . Financial resource strain: Not on file  . Food insecurity - worry: Not on file  . Food insecurity - inability: Not on file  . Transportation needs - medical: Not on file  . Transportation needs - non-medical: Not on file  Occupational History  . Not on file  Tobacco Use  . Smoking status: Never Smoker  . Smokeless tobacco: Never Used  Substance and Sexual Activity  . Alcohol use: Yes    Alcohol/week: 0.0 oz  . Drug use: No  . Sexual activity: Not Currently    Partners: Male  Other Topics Concern  . Not on file  Social History Narrative   Retired from [employer] to [employer] 07/2015.   From [place].    Margaret Graham's family history includes Arthritis in her father and mother; Diabetes in her son; Heart disease in her paternal uncle; Hyperlipidemia in her father.      Objective:    Vitals:   11/07/17 1508  BP: 108/74  Pulse: 70    Physical Exam well-developed older white female in no acute distress, pleasant blood pressure 108/74 pulse 70, height 5 foot 5, weight 158, BMI 26.3. HEENT; nontraumatic normocephalic EOMI PERRLA sclera anicteric Cardiovascular; regular rate and rhythm with S1-S2 no murmur or gallop, Pulmonary ;clear bilaterally, Abdomen; soft, nondistended she has some very mild tenderness in the lower abdomen there which is nonfocal no guarding or rebound no palpable mass or hepatosplenomegaly bowel sounds present, Rectal; exam not done, Extremities; no clubbing cyanosis or edema skin warm and dry, Neuropsych; mood and affect appropriate       Assessment & Plan:   #78 72 year old white female with change in bowel habits over the past 6 weeks with new onset constipation and general mild abdominal discomfort, bloating intermittent cramping. Patient also feels she said narrower caliber stools over the past couple of months. It is unclear whether this represents just  functional constipation or secondary to intra-abdominal inflammatory process or neoplasm.  #2 history of diverticulosis #3 GERD #4 history of Schatzki's ring requiring previous dilation elsewhere  Plan; CBC with differential and sedimentation rate today. Patient had a BMET done December 20 that PCP which was unremarkable Schedule for CT of the abdomen and pelvis with contrast Patient will continue MiraLAX but increase to twice daily until she is having significant passage of stool over a couple of days and then decrease to MiraLAX once daily in 8 ounces of water. Further plans pending results of above.  Lathen Seal S Menashe Kafer PA-C 11/07/2017   Cc: Copland, Gay Filler, MD

## 2017-11-07 NOTE — Patient Instructions (Addendum)
Please go to the basement level to have your labs drawn.  Take Miralax 17 grams in 8 oz of water- do 2 doses daily until bowels move well, then decrease to once dose daily.   You have been scheduled for a CT scan of the abdomen and pelvis at [facility] ([address]---this is in the same building as Press [facility]).   You are scheduled on Tuesday 11-12-2017 at 11:30 am. You should arrive 15 minutes prior to your appointment time for registration. Please follow the written instructions below on the day of your exam:  WARNING: IF YOU ARE ALLERGIC TO IODINE/X-RAY DYE, PLEASE NOTIFY RADIOLOGY IMMEDIATELY AT [phone]! YOU WILL BE GIVEN A 13 HOUR PREMEDICATION PREP.  1) Do not eat or drink anything after 7:30 am (4 hours prior to your test) 2) You have been given 2 bottles of oral contrast to drink. The solution may taste               better if refrigerated, but do NOT add ice or any other liquid to this solution. Shake             well before drinking.    Drink 1 bottle of contrast @ 9:30 am (2 hours prior to your exam)  Drink 1 bottle of contrast @ 10:30 am (1 hour prior to your exam)  You may take any medications as prescribed with a small amount of water except for the following: Metformin, Glucophage, Glucovance, Avandamet, Riomet, Fortamet, Actoplus Met, Janumet, Glumetza or Metaglip. The above medications must be held the day of the exam AND 48 hours after the exam.  The purpose of you drinking the oral contrast is to aid in the visualization of your intestinal tract. The contrast solution may cause some diarrhea. Before your exam is started, you will be given a small amount of fluid to drink. Depending on your individual set of symptoms, you may also receive an intravenous injection of x-ray contrast/dye. Plan on being at [facility] for 30 minutes or long, depending on the type of exam you are having performed.  If you have any questions regarding your exam or  if you need to reschedule, you may call the CT department at [phone] between the hours of 8:00 am and 5:00 pm, Monday-Friday.  ________________________________________________________________________

## 2017-11-11 DIAGNOSIS — S93492A Sprain of other ligament of left ankle, initial encounter: Secondary | ICD-10-CM | POA: Diagnosis not present

## 2017-11-11 DIAGNOSIS — M25572 Pain in left ankle and joints of left foot: Secondary | ICD-10-CM | POA: Diagnosis not present

## 2017-11-11 DIAGNOSIS — S8002XA Contusion of left knee, initial encounter: Secondary | ICD-10-CM | POA: Diagnosis not present

## 2017-11-11 DIAGNOSIS — M25562 Pain in left knee: Secondary | ICD-10-CM | POA: Diagnosis not present

## 2017-11-11 DIAGNOSIS — T148XXA Other injury of unspecified body region, initial encounter: Secondary | ICD-10-CM | POA: Diagnosis not present

## 2017-11-12 ENCOUNTER — Inpatient Hospital Stay: Admission: RE | Admit: 2017-11-12 | Payer: Medicare Other | Source: Ambulatory Visit

## 2017-11-15 DIAGNOSIS — H04123 Dry eye syndrome of bilateral lacrimal glands: Secondary | ICD-10-CM | POA: Diagnosis not present

## 2017-11-19 ENCOUNTER — Ambulatory Visit (INDEPENDENT_AMBULATORY_CARE_PROVIDER_SITE_OTHER)
Admission: RE | Admit: 2017-11-19 | Discharge: 2017-11-19 | Disposition: A | Discharge status: Home / Self Care | Payer: Medicare Other | Source: Ambulatory Visit | Attending: Physician Assistant | Admitting: Physician Assistant

## 2017-11-19 DIAGNOSIS — K5900 Constipation, unspecified: Secondary | ICD-10-CM

## 2017-11-19 DIAGNOSIS — R1084 Generalized abdominal pain: Secondary | ICD-10-CM

## 2017-11-19 DIAGNOSIS — K429 Umbilical hernia without obstruction or gangrene: Secondary | ICD-10-CM | POA: Diagnosis not present

## 2017-11-19 DIAGNOSIS — R194 Change in bowel habit: Secondary | ICD-10-CM

## 2017-11-19 MED ORDER — IOPAMIDOL (ISOVUE-300) INJECTION 61%
100.0000 mL | Freq: Once | INTRAVENOUS | Status: AC | PRN
  Administered 2017-11-19: 100 mL via INTRAVENOUS

## 2017-11-20 ENCOUNTER — Encounter: Payer: Self-pay | Admitting: Physician Assistant

## 2017-11-21 ENCOUNTER — Telehealth: Payer: Self-pay | Admitting: Physician Assistant

## 2017-11-21 NOTE — Telephone Encounter (Signed)
Spoke with the patient. She has been taking Famotidine 40 mg daily. She does not take Omeprazole. She is uncertain how long she should take Famotidine. Reports she does at times have the sensation that something in "maybe" in her esophagus. She does have difficulty in describing the sensation. She does not want to take medication if it is not warranted. The patient is scheduled for a colonoscopy in February for other issues. Appointment made to address this issue.

## 2017-11-22 ENCOUNTER — Ambulatory Visit (INDEPENDENT_AMBULATORY_CARE_PROVIDER_SITE_OTHER): Payer: Medicare Other | Admitting: Gastroenterology

## 2017-11-22 ENCOUNTER — Encounter: Payer: Self-pay | Admitting: Gastroenterology

## 2017-11-22 VITALS — BP 108/66 | HR 62 | Ht 65.0 in | Wt 157.0 lb

## 2017-11-22 DIAGNOSIS — R198 Other specified symptoms and signs involving the digestive system and abdomen: Secondary | ICD-10-CM | POA: Diagnosis not present

## 2017-11-22 DIAGNOSIS — R933 Abnormal findings on diagnostic imaging of other parts of digestive tract: Secondary | ICD-10-CM

## 2017-11-22 MED ORDER — NA SULFATE-K SULFATE-MG SULF 17.5-3.13-1.6 GM/177ML PO SOLN: 1.0000 | Freq: Once | ORAL | 0 refills | Status: AC

## 2017-11-22 NOTE — Patient Instructions (Signed)
If you are age 72 or older, your body mass index should be between 23-30. Your Body mass index is 26.13 kg/m. If this is out of the aforementioned range listed, please consider follow up with your Primary Care Provider.  If you are age 22 or younger, your body mass index should be between 19-25. Your Body mass index is 26.13 kg/m. If this is out of the aformentioned range listed, please consider follow up with your Primary Care Provider.   You have been scheduled for a colonoscopy. Please follow written instructions given to you at your visit today.  Please pick up your prep supplies at the pharmacy within the next 1-3 days. If you use inhalers (even only as needed), please bring them with you on the day of your procedure. Your physician has requested that you go to www.startemmi.com and enter the access code given to you at your visit today. This web site gives a general overview about your procedure. However, you should still follow specific instructions given to you by our office regarding your preparation for the procedure.  Thank you for choosing [facility]  Dr Wilfrid Lund III

## 2017-11-22 NOTE — Progress Notes (Signed)
     [facility] Progress Note  Chief Complaint: Constipation and abnormal CT scan  Subjective  History:  Margaret Graham saw me in May 2017 for a previous food impaction from a Schatzki ring and reported GERD treated with H2 blocker.  See that note for details. She has continued the Pepcid, but  wants to know if she still needs it.  She denies dysphagia, and was not having chronic symptoms of reflux prior to the food impaction. She was having change in bowel habits that has been somewhat better on MiraLAX.  CT scan showed some laxity of the pelvic floor with a probable rectocele, sigmoid diverticulosis and a questionable 2 cm "mass" on the sidewall of the rectum.  It was uncertain if this was due to neoplasia or perhaps contraction/under distention.  She denies rectal bleeding, and [place].  She was told there were no polyps at that time.  ROS: Cardiovascular:  no chest pain Respiratory: no dyspnea  The patient's Past Medical, Family and Social History were reviewed and are on file in the EMR.  Objective:  Med list reviewed  Current Outpatient Medications:  .  aspirin 81 MG tablet, Take 81 mg by mouth daily., Disp: , Rfl:  .  Calcium Carb-Cholecalciferol (CALCIUM 600 + D PO), Take by mouth., Disp: , Rfl:  .  famotidine (PEPCID) 40 MG tablet, Take 40 mg by mouth daily., Disp: , Rfl:  .  levothyroxine (SYNTHROID, LEVOTHROID) 100 MCG tablet, Take 1 tablet (100 mcg total) by mouth every morning., Disp: 30 tablet, Rfl: 3 .  Multiple Vitamin (MULTIVITAMIN) capsule, Take 1 capsule by mouth daily., Disp: , Rfl:  .  simvastatin (ZOCOR) 20 MG tablet, Take 1 tablet (20 mg total) by mouth daily., Disp: 30 tablet, Rfl: 0 .  Na Sulfate-K Sulfate-Mg Sulf 17.5-3.13-1.6 GM/177ML SOLN, Take 1 kit by mouth once for 1 dose., Disp: 354 mL, Rfl: 0   Vital signs in last 24 hrs: Vitals:   11/22/17 1126  BP: 108/66  Pulse: 62    Physical Exam   No exam today.   Total time 20 minutes spent on encounter, all of which was in discussion of her findings and plan.  Her husband was present for the entire encounter.    Radiologic studies:  CT abdomen and pelvis report reviewed, findings as above  '@ASSESSMENTPLANBEGIN'$ @ Assessment: Encounter Diagnoses  Name Primary?  . Abnormal CT scan, gastrointestinal tract Yes  . Change in bowel function    She does not have chronic symptoms of GERD, so she can stop the Pepcid.  They can always be restarted if GERD symptoms recur.  There is no dysphagia to suggest any significant recurrence of Schatzki ring, so no endoscopy seems necessary at this time.  Findings on CT scan are of uncertain significance.  I suspect the diverticulosis in the pelvic floor laxity are the most likely cause of constipation, the rectal finding is uncertain but must be investigated.   Plan: She is scheduled for a colonoscopy 2 weeks from now.  The benefits and risks of the planned procedure were described in detail with the patient or (when appropriate) their health care proxy.  Risks were outlined as including, but not limited to, bleeding, infection, perforation, adverse medication reaction leading to cardiac or pulmonary decompensation, or pancreatitis (if ERCP).  The limitation of incomplete mucosal visualization was also discussed.  No guarantees or warranties were given.   [name]

## 2017-11-27 DIAGNOSIS — H6981 Other specified disorders of Eustachian tube, right ear: Secondary | ICD-10-CM | POA: Diagnosis not present

## 2017-11-27 DIAGNOSIS — H6502 Acute serous otitis media, left ear: Secondary | ICD-10-CM | POA: Diagnosis not present

## 2017-12-02 ENCOUNTER — Ambulatory Visit: Payer: Medicare Other | Admitting: Gastroenterology

## 2017-12-03 ENCOUNTER — Encounter: Payer: Self-pay | Admitting: Gastroenterology

## 2017-12-03 ENCOUNTER — Encounter: Payer: Medicare Other | Admitting: Gastroenterology

## 2017-12-03 ENCOUNTER — Other Ambulatory Visit: Payer: Self-pay

## 2017-12-03 ENCOUNTER — Ambulatory Visit (AMBULATORY_SURGERY_CENTER): Payer: Medicare Other | Admitting: Gastroenterology

## 2017-12-03 VITALS — BP 113/64 | HR 64 | Temp 98.0°F | Resp 10 | Ht 65.0 in | Wt 157.0 lb

## 2017-12-03 DIAGNOSIS — R933 Abnormal findings on diagnostic imaging of other parts of digestive tract: Secondary | ICD-10-CM | POA: Diagnosis not present

## 2017-12-03 DIAGNOSIS — D128 Benign neoplasm of rectum: Secondary | ICD-10-CM

## 2017-12-03 DIAGNOSIS — R9389 Abnormal findings on diagnostic imaging of other specified body structures: Secondary | ICD-10-CM | POA: Diagnosis not present

## 2017-12-03 DIAGNOSIS — R194 Change in bowel habit: Secondary | ICD-10-CM

## 2017-12-03 MED ORDER — SODIUM CHLORIDE 0.9 % IV SOLN: 500.0000 mL | Freq: Once | INTRAVENOUS | Status: DC

## 2017-12-03 NOTE — Patient Instructions (Signed)
YOU HAD AN ENDOSCOPIC PROCEDURE TODAY AT [facility]:   Refer to the procedure report that was given to you for any specific questions about what was found during the examination.  If the procedure report does not answer your questions, please call your gastroenterologist to clarify.  If you requested that your care partner not be given the details of your procedure findings, then the procedure report has been included in a sealed envelope for you to review at your convenience later.  YOU SHOULD EXPECT: Some feelings of bloating in the abdomen. Passage of more gas than usual.  Walking can help get rid of the air that was put into your GI tract during the procedure and reduce the bloating. If you had a lower endoscopy (such as a colonoscopy or flexible sigmoidoscopy) you may notice spotting of blood in your stool or on the toilet paper. If you underwent a bowel prep for your procedure, you may not have a normal bowel movement for a few days.  Please Note:  You might notice some irritation and congestion in your nose or some drainage.  This is from the oxygen used during your procedure.  There is no need for concern and it should clear up in a day or so.  SYMPTOMS TO REPORT IMMEDIATELY:   Following lower endoscopy (colonoscopy or flexible sigmoidoscopy):  Excessive amounts of blood in the stool  Significant tenderness or worsening of abdominal pains  Swelling of the abdomen that is new, acute  Fever of 100F or higher    For urgent or emergent issues, a gastroenterologist can be reached at any hour by calling [phone].   DIET:  We do recommend a small meal at first, but then you may proceed to your regular diet.  Drink plenty of fluids but you should avoid alcoholic beverages for 24 hours.  ACTIVITY:  You should plan to take it easy for the rest of today and you should NOT DRIVE or use heavy machinery until tomorrow (because of the sedation medicines used during the test).     FOLLOW UP: Our staff will call the number listed on your records the next business day following your procedure to check on you and address any questions or concerns that you may have regarding the information given to you following your procedure. If we do not reach you, we will leave a message.  However, if you are feeling well and you are not experiencing any problems, there is no need to return our call.  We will assume that you have returned to your regular daily activities without incident.  If any biopsies were taken you will be contacted by phone or by letter within the next 1-3 weeks.  Please call us at [phone] if you have not heard about the biopsies in 3 weeks.   Await for biopsy results to determine next repeat Colonoscopy screening Polyps (handout given) Diverticulosis (handout given) Adjust Miralax dose as needed  SIGNATURES/CONFIDENTIALITY: You and/or your care partner have signed paperwork which will be entered into your electronic medical record.  These signatures attest to the fact that that the information above on your After Visit Summary has been reviewed and is understood.  Full responsibility of the confidentiality of this discharge information lies with you and/or your care-partner.

## 2017-12-03 NOTE — Progress Notes (Signed)
Called to room to assist during endoscopic procedure.  Patient ID and intended procedure confirmed with present staff. Received instructions for my participation in the procedure from the performing physician.  

## 2017-12-03 NOTE — Progress Notes (Signed)
Pt's states no medical or surgical changes since previsit or office visit. 

## 2017-12-03 NOTE — Progress Notes (Signed)
Spontaneous respirations throughout. VSS. Resting comfortably. To PACU on room air. Report to  RN. 

## 2017-12-03 NOTE — Op Note (Signed)
[facility] Patient Name: Margaret Graham Procedure Date: 12/03/2017 1:24 PM MRN: [id number] Endoscopist: Mallie Mussel L. Loletha Carrow , MD Age: 72 Referring MD:  Date of Birth: 31-Mar-1946 Gender: Female Account #: [id number] Procedure:                Colonoscopy Indications:              Abnormal CT of the GI tract (suggesting rectal wall                            abnormality), Change in bowel habits, Constipation Medicines:                Monitored Anesthesia Care Procedure:                Pre-Anesthesia Assessment:                           - Prior to the procedure, a History and Physical                            was performed, and patient medications and                            allergies were reviewed. The patient's tolerance of                            previous anesthesia was also reviewed. The risks                            and benefits of the procedure and the sedation                            options and risks were discussed with the patient.                            All questions were answered, and informed consent                            was obtained. Anticoagulants: The patient has taken                            aspirin. It was decided not to withhold this                            medication prior to the procedure. ASA Grade                            Assessment: II - A patient with mild systemic                            disease. After reviewing the risks and benefits,                            the patient was deemed in satisfactory condition to  undergo the procedure.                           After obtaining informed consent, the colonoscope                            was passed under direct vision. Throughout the                            procedure, the patient's blood pressure, pulse, and                            oxygen saturations were monitored continuously. The                            Colonoscope was introduced  through the anus and                            advanced to the the cecum, identified by                            appendiceal orifice and ileocecal valve. The                            colonoscopy was performed without difficulty. The                            patient tolerated the procedure well. The quality                            of the bowel preparation was good. The ileocecal                            valve, appendiceal orifice, and rectum were                            photographed. The quality of the bowel preparation                            was evaluated using the BBPS [facility] Bowel                            Preparation Scale) with scores of: Right Colon = 2,                            Transverse Colon = 2 and Left Colon = 2. The total                            BBPS score equals 6. Scope In: 1:29:07 PM Scope Out: 1:39:59 PM Scope Withdrawal Time: 0 hours 8 minutes 27 seconds  Total Procedure Duration: 0 hours 10 minutes 52 seconds  Findings:                 The perianal and digital rectal examinations were  normal.                           Many medium-mouthed diverticula were found in the                            left colon.                           A 2 mm polyp was found in the rectum. The polyp was                            sessile. The polyp was removed with a cold biopsy                            forceps. Resection and retrieval were complete. The                            rectum was otherwise normal.                           The exam was otherwise without abnormality on                            direct and retroflexion views. Complications:            No immediate complications. Estimated Blood Loss:     Estimated blood loss was minimal. Impression:               - Diverticulosis in the left colon.                           - One 2 mm polyp in the rectum, removed with a cold                            biopsy forceps. Resected  and retrieved.                           - The examination was otherwise normal on direct                            and retroflexion views.                           Constipation appears to be a combination of                            diverticulosis and pelvic floor laxity seen on CT                            scan. Recommendation:           - Patient has a contact number available for                            emergencies. The signs and symptoms of potential  delayed complications were discussed with the                            patient. Return to normal activities tomorrow.                            Written discharge instructions were provided to the                            patient.                           - Resume previous diet.                           - Continue present medications. Adjust miralax dose                            as needed.                           - Await pathology results.                           - Repeat colonoscopy is recommended for                            surveillance. The colonoscopy date will be                            determined after pathology results from today's                            exam become available for review. Henry L. Loletha Carrow, MD 12/03/2017 1:46:36 PM This report has been signed electronically.

## 2017-12-04 ENCOUNTER — Telehealth: Payer: Self-pay | Admitting: *Deleted

## 2017-12-04 DIAGNOSIS — S8002XA Contusion of left knee, initial encounter: Secondary | ICD-10-CM | POA: Diagnosis not present

## 2017-12-04 DIAGNOSIS — S93492A Sprain of other ligament of left ankle, initial encounter: Secondary | ICD-10-CM | POA: Diagnosis not present

## 2017-12-04 DIAGNOSIS — T148XXA Other injury of unspecified body region, initial encounter: Secondary | ICD-10-CM | POA: Diagnosis not present

## 2017-12-04 NOTE — Telephone Encounter (Signed)
  Follow up Call-  Call back number 12/03/2017  Post procedure Call Back phone  # [phone]  Permission to leave phone message Yes     Patient questions:  Do you have a fever, pain , or abdominal swelling? No. Pain Score  0 *  Have you tolerated food without any problems? Yes.    Have you been able to return to your normal activities? Yes.    Do you have any questions about your discharge instructions: Diet   No. Medications  No. Follow up visit  No.  Do you have questions or concerns about your Care? No.  Actions: * If pain score is 4 or above: No action needed, pain <4.

## 2017-12-04 NOTE — Telephone Encounter (Signed)
No answer, message left for the patient. 

## 2017-12-09 ENCOUNTER — Encounter: Payer: Self-pay | Admitting: Gastroenterology

## 2017-12-16 DIAGNOSIS — S93402D Sprain of unspecified ligament of left ankle, subsequent encounter: Secondary | ICD-10-CM | POA: Diagnosis not present

## 2017-12-16 DIAGNOSIS — S8002XD Contusion of left knee, subsequent encounter: Secondary | ICD-10-CM | POA: Diagnosis not present

## 2018-03-12 DIAGNOSIS — D225 Melanocytic nevi of trunk: Secondary | ICD-10-CM | POA: Diagnosis not present

## 2018-03-12 DIAGNOSIS — L821 Other seborrheic keratosis: Secondary | ICD-10-CM | POA: Diagnosis not present

## 2018-03-12 DIAGNOSIS — L814 Other melanin hyperpigmentation: Secondary | ICD-10-CM | POA: Diagnosis not present

## 2018-03-12 DIAGNOSIS — L570 Actinic keratosis: Secondary | ICD-10-CM | POA: Diagnosis not present

## 2018-03-12 DIAGNOSIS — C44519 Basal cell carcinoma of skin of other part of trunk: Secondary | ICD-10-CM | POA: Diagnosis not present

## 2018-03-12 DIAGNOSIS — D3613 Benign neoplasm of peripheral nerves and autonomic nervous system of lower limb, including hip: Secondary | ICD-10-CM | POA: Diagnosis not present

## 2018-03-12 DIAGNOSIS — D1801 Hemangioma of skin and subcutaneous tissue: Secondary | ICD-10-CM | POA: Diagnosis not present

## 2018-03-26 DIAGNOSIS — I788 Other diseases of capillaries: Secondary | ICD-10-CM | POA: Diagnosis not present

## 2018-03-26 DIAGNOSIS — C44519 Basal cell carcinoma of skin of other part of trunk: Secondary | ICD-10-CM | POA: Diagnosis not present

## 2018-04-16 DIAGNOSIS — Z6826 Body mass index (BMI) 26.0-26.9, adult: Secondary | ICD-10-CM | POA: Diagnosis not present

## 2018-04-16 DIAGNOSIS — E7849 Other hyperlipidemia: Secondary | ICD-10-CM | POA: Diagnosis not present

## 2018-04-16 DIAGNOSIS — Z1389 Encounter for screening for other disorder: Secondary | ICD-10-CM | POA: Diagnosis not present

## 2018-04-16 DIAGNOSIS — D126 Benign neoplasm of colon, unspecified: Secondary | ICD-10-CM | POA: Diagnosis not present

## 2018-04-16 DIAGNOSIS — E038 Other specified hypothyroidism: Secondary | ICD-10-CM | POA: Diagnosis not present

## 2018-04-16 DIAGNOSIS — E041 Nontoxic single thyroid nodule: Secondary | ICD-10-CM | POA: Diagnosis not present

## 2018-04-16 DIAGNOSIS — M810 Age-related osteoporosis without current pathological fracture: Secondary | ICD-10-CM | POA: Diagnosis not present

## 2018-05-13 DIAGNOSIS — R0781 Pleurodynia: Secondary | ICD-10-CM | POA: Diagnosis not present

## 2018-05-13 DIAGNOSIS — Z6827 Body mass index (BMI) 27.0-27.9, adult: Secondary | ICD-10-CM | POA: Diagnosis not present

## 2018-05-23 DIAGNOSIS — H04123 Dry eye syndrome of bilateral lacrimal glands: Secondary | ICD-10-CM | POA: Diagnosis not present

## 2018-05-23 DIAGNOSIS — H2513 Age-related nuclear cataract, bilateral: Secondary | ICD-10-CM | POA: Diagnosis not present

## 2018-05-28 ENCOUNTER — Encounter: Payer: Self-pay | Admitting: Gastroenterology

## 2018-05-28 ENCOUNTER — Other Ambulatory Visit: Payer: Self-pay | Admitting: Gastroenterology

## 2018-05-28 MED ORDER — FAMOTIDINE 40 MG PO TABS: 40.0000 mg | ORAL_TABLET | Freq: Every day | ORAL | 1 refills | Status: DC

## 2018-07-11 DIAGNOSIS — Z23 Encounter for immunization: Secondary | ICD-10-CM | POA: Diagnosis not present

## 2018-07-15 DIAGNOSIS — M810 Age-related osteoporosis without current pathological fracture: Secondary | ICD-10-CM | POA: Diagnosis not present

## 2018-07-15 DIAGNOSIS — Z8262 Family history of osteoporosis: Secondary | ICD-10-CM | POA: Diagnosis not present

## 2018-07-15 DIAGNOSIS — E039 Hypothyroidism, unspecified: Secondary | ICD-10-CM | POA: Diagnosis not present

## 2018-07-15 DIAGNOSIS — K219 Gastro-esophageal reflux disease without esophagitis: Secondary | ICD-10-CM | POA: Diagnosis not present

## 2018-07-18 ENCOUNTER — Other Ambulatory Visit: Payer: Self-pay | Admitting: Nurse Practitioner

## 2018-07-18 DIAGNOSIS — M810 Age-related osteoporosis without current pathological fracture: Secondary | ICD-10-CM

## 2018-07-25 ENCOUNTER — Ambulatory Visit
Admission: RE | Admit: 2018-07-25 | Discharge: 2018-07-25 | Disposition: A | Discharge status: Home / Self Care | Payer: Medicare Other | Source: Ambulatory Visit | Attending: Nurse Practitioner | Admitting: Nurse Practitioner

## 2018-07-25 DIAGNOSIS — M810 Age-related osteoporosis without current pathological fracture: Secondary | ICD-10-CM

## 2018-07-25 DIAGNOSIS — M8589 Other specified disorders of bone density and structure, multiple sites: Secondary | ICD-10-CM | POA: Diagnosis not present

## 2018-07-25 DIAGNOSIS — Z780 Asymptomatic menopausal state: Secondary | ICD-10-CM | POA: Diagnosis not present

## 2018-08-19 ENCOUNTER — Telehealth: Payer: Self-pay | Admitting: Gastroenterology

## 2018-08-19 NOTE — Telephone Encounter (Signed)
Patient states she has been constipated for 4 days now and would like some advice.

## 2018-08-19 NOTE — Telephone Encounter (Signed)
Patient advised to try the Fleets enemas and if no results can take Magnesium Citrate.

## 2018-08-19 NOTE — Telephone Encounter (Signed)
Patient has been using Miralax once a day, last 4 days it has not helped constipation.  She did take one dose BID yesterday, still no results. Denies fever, vomiting. States very uncomfortable, distended abdomen. Please advise.

## 2018-08-19 NOTE — Telephone Encounter (Signed)
Get a 2-pack of Fleets enemas. Use one enema, hold as long as possible. If little or no results, do the other fleets enema.  If still little or no result:  Take a half bottle of magnesium citrate.  If no results within 2 hours, take the other half bottle.

## 2018-10-14 DIAGNOSIS — K219 Gastro-esophageal reflux disease without esophagitis: Secondary | ICD-10-CM | POA: Diagnosis not present

## 2018-10-14 DIAGNOSIS — E039 Hypothyroidism, unspecified: Secondary | ICD-10-CM | POA: Diagnosis not present

## 2018-10-14 DIAGNOSIS — Z8262 Family history of osteoporosis: Secondary | ICD-10-CM | POA: Diagnosis not present

## 2018-10-14 DIAGNOSIS — M810 Age-related osteoporosis without current pathological fracture: Secondary | ICD-10-CM | POA: Diagnosis not present

## 2018-10-15 DIAGNOSIS — M810 Age-related osteoporosis without current pathological fracture: Secondary | ICD-10-CM | POA: Diagnosis not present

## 2018-10-15 DIAGNOSIS — D126 Benign neoplasm of colon, unspecified: Secondary | ICD-10-CM | POA: Diagnosis not present

## 2018-10-15 DIAGNOSIS — I700 Atherosclerosis of aorta: Secondary | ICD-10-CM | POA: Diagnosis not present

## 2018-10-15 DIAGNOSIS — J984 Other disorders of lung: Secondary | ICD-10-CM | POA: Diagnosis not present

## 2018-10-15 DIAGNOSIS — E038 Other specified hypothyroidism: Secondary | ICD-10-CM | POA: Diagnosis not present

## 2018-10-15 DIAGNOSIS — E041 Nontoxic single thyroid nodule: Secondary | ICD-10-CM | POA: Diagnosis not present

## 2018-10-15 DIAGNOSIS — E7849 Other hyperlipidemia: Secondary | ICD-10-CM | POA: Diagnosis not present

## 2018-11-18 ENCOUNTER — Other Ambulatory Visit: Payer: Self-pay | Admitting: Gastroenterology

## 2018-12-12 ENCOUNTER — Telehealth: Payer: Self-pay | Admitting: Gastroenterology

## 2018-12-12 NOTE — Telephone Encounter (Signed)
Pt called in wanting to know if she is able to take two doses of miralax in a day. She stated she is experiencing some constipation.

## 2018-12-12 NOTE — Telephone Encounter (Signed)
Pt wanted to clarify that she could use Miralax upto 3 times a day. Advised she can titrate up to 3 doses a day.

## 2018-12-18 DIAGNOSIS — R05 Cough: Secondary | ICD-10-CM | POA: Diagnosis not present

## 2018-12-18 DIAGNOSIS — Z6826 Body mass index (BMI) 26.0-26.9, adult: Secondary | ICD-10-CM | POA: Diagnosis not present

## 2019-04-01 DIAGNOSIS — D225 Melanocytic nevi of trunk: Secondary | ICD-10-CM | POA: Diagnosis not present

## 2019-04-01 DIAGNOSIS — Z85828 Personal history of other malignant neoplasm of skin: Secondary | ICD-10-CM | POA: Diagnosis not present

## 2019-04-01 DIAGNOSIS — L821 Other seborrheic keratosis: Secondary | ICD-10-CM | POA: Diagnosis not present

## 2019-04-01 DIAGNOSIS — D2261 Melanocytic nevi of right upper limb, including shoulder: Secondary | ICD-10-CM | POA: Diagnosis not present

## 2019-04-01 DIAGNOSIS — D2272 Melanocytic nevi of left lower limb, including hip: Secondary | ICD-10-CM | POA: Diagnosis not present

## 2019-04-01 DIAGNOSIS — L570 Actinic keratosis: Secondary | ICD-10-CM | POA: Diagnosis not present

## 2019-04-01 DIAGNOSIS — L814 Other melanin hyperpigmentation: Secondary | ICD-10-CM | POA: Diagnosis not present

## 2019-04-01 DIAGNOSIS — L910 Hypertrophic scar: Secondary | ICD-10-CM | POA: Diagnosis not present

## 2019-04-01 DIAGNOSIS — D2262 Melanocytic nevi of left upper limb, including shoulder: Secondary | ICD-10-CM | POA: Diagnosis not present

## 2019-04-10 DIAGNOSIS — M859 Disorder of bone density and structure, unspecified: Secondary | ICD-10-CM | POA: Diagnosis not present

## 2019-04-10 DIAGNOSIS — E039 Hypothyroidism, unspecified: Secondary | ICD-10-CM | POA: Diagnosis not present

## 2019-04-10 DIAGNOSIS — E7849 Other hyperlipidemia: Secondary | ICD-10-CM | POA: Diagnosis not present

## 2019-04-15 DIAGNOSIS — R82998 Other abnormal findings in urine: Secondary | ICD-10-CM | POA: Diagnosis not present

## 2019-04-17 DIAGNOSIS — D126 Benign neoplasm of colon, unspecified: Secondary | ICD-10-CM | POA: Diagnosis not present

## 2019-04-17 DIAGNOSIS — I700 Atherosclerosis of aorta: Secondary | ICD-10-CM | POA: Diagnosis not present

## 2019-04-17 DIAGNOSIS — Z0000 Encounter for general adult medical examination without abnormal findings: Secondary | ICD-10-CM | POA: Diagnosis not present

## 2019-04-17 DIAGNOSIS — E039 Hypothyroidism, unspecified: Secondary | ICD-10-CM | POA: Diagnosis not present

## 2019-04-17 DIAGNOSIS — J984 Other disorders of lung: Secondary | ICD-10-CM | POA: Diagnosis not present

## 2019-04-17 DIAGNOSIS — M810 Age-related osteoporosis without current pathological fracture: Secondary | ICD-10-CM | POA: Diagnosis not present

## 2019-04-17 DIAGNOSIS — Z1339 Encounter for screening examination for other mental health and behavioral disorders: Secondary | ICD-10-CM | POA: Diagnosis not present

## 2019-04-17 DIAGNOSIS — Z1331 Encounter for screening for depression: Secondary | ICD-10-CM | POA: Diagnosis not present

## 2019-04-17 DIAGNOSIS — E041 Nontoxic single thyroid nodule: Secondary | ICD-10-CM | POA: Diagnosis not present

## 2019-05-10 ENCOUNTER — Other Ambulatory Visit: Payer: Self-pay | Admitting: Gastroenterology

## 2019-05-23 ENCOUNTER — Other Ambulatory Visit: Payer: Self-pay | Admitting: Gastroenterology

## 2019-06-16 DIAGNOSIS — H2513 Age-related nuclear cataract, bilateral: Secondary | ICD-10-CM | POA: Diagnosis not present

## 2019-06-16 DIAGNOSIS — H04123 Dry eye syndrome of bilateral lacrimal glands: Secondary | ICD-10-CM | POA: Diagnosis not present

## 2019-06-16 DIAGNOSIS — H524 Presbyopia: Secondary | ICD-10-CM | POA: Diagnosis not present

## 2019-06-25 DIAGNOSIS — H6982 Other specified disorders of Eustachian tube, left ear: Secondary | ICD-10-CM | POA: Diagnosis not present

## 2019-07-13 DIAGNOSIS — Z23 Encounter for immunization: Secondary | ICD-10-CM | POA: Diagnosis not present

## 2019-08-24 ENCOUNTER — Other Ambulatory Visit: Payer: Self-pay | Admitting: Gastroenterology

## 2019-09-16 DIAGNOSIS — K219 Gastro-esophageal reflux disease without esophagitis: Secondary | ICD-10-CM | POA: Diagnosis not present

## 2019-09-16 DIAGNOSIS — M810 Age-related osteoporosis without current pathological fracture: Secondary | ICD-10-CM | POA: Diagnosis not present

## 2019-09-16 DIAGNOSIS — Z8262 Family history of osteoporosis: Secondary | ICD-10-CM | POA: Diagnosis not present

## 2019-09-16 DIAGNOSIS — E039 Hypothyroidism, unspecified: Secondary | ICD-10-CM | POA: Diagnosis not present

## 2019-09-21 ENCOUNTER — Telehealth: Payer: Self-pay | Admitting: Gastroenterology

## 2019-09-22 ENCOUNTER — Other Ambulatory Visit: Payer: Self-pay

## 2019-09-22 MED ORDER — FAMOTIDINE 40 MG PO TABS: 40.0000 mg | ORAL_TABLET | Freq: Every day | ORAL | 1 refills | Status: DC

## 2019-09-22 NOTE — Telephone Encounter (Signed)
I cannot find the prior famotidine dosing in her medicine history.  Please research it and refill same dosing for 2 months

## 2019-09-22 NOTE — Telephone Encounter (Signed)
Per last office note in 2019, Famotidine 40 mg one daily. Rx sent for 1 a day

## 2019-09-22 NOTE — Telephone Encounter (Signed)
I only see omeprazole on her med list, but I see where you gave her famotidine last year. She is scheduled for a follow up in Jan 2021. Please advise.

## 2019-10-05 ENCOUNTER — Other Ambulatory Visit: Payer: Self-pay | Admitting: Nurse Practitioner

## 2019-10-05 DIAGNOSIS — M810 Age-related osteoporosis without current pathological fracture: Secondary | ICD-10-CM

## 2019-10-12 DIAGNOSIS — Z1231 Encounter for screening mammogram for malignant neoplasm of breast: Secondary | ICD-10-CM | POA: Diagnosis not present

## 2019-10-16 ENCOUNTER — Other Ambulatory Visit: Payer: Self-pay | Admitting: Nurse Practitioner

## 2019-10-16 DIAGNOSIS — Z5181 Encounter for therapeutic drug level monitoring: Secondary | ICD-10-CM

## 2019-10-16 DIAGNOSIS — M810 Age-related osteoporosis without current pathological fracture: Secondary | ICD-10-CM

## 2019-10-17 ENCOUNTER — Other Ambulatory Visit: Payer: Self-pay | Admitting: Gastroenterology

## 2019-10-20 ENCOUNTER — Ambulatory Visit
Admission: RE | Admit: 2019-10-20 | Discharge: 2019-10-20 | Disposition: A | Discharge status: Home / Self Care | Payer: Medicare Other | Source: Ambulatory Visit | Attending: Nurse Practitioner | Admitting: Nurse Practitioner

## 2019-10-20 ENCOUNTER — Other Ambulatory Visit: Payer: Self-pay

## 2019-10-20 DIAGNOSIS — M810 Age-related osteoporosis without current pathological fracture: Secondary | ICD-10-CM

## 2019-10-20 DIAGNOSIS — M8589 Other specified disorders of bone density and structure, multiple sites: Secondary | ICD-10-CM | POA: Diagnosis not present

## 2019-10-20 DIAGNOSIS — Z5181 Encounter for therapeutic drug level monitoring: Secondary | ICD-10-CM

## 2019-10-20 DIAGNOSIS — Z780 Asymptomatic menopausal state: Secondary | ICD-10-CM | POA: Diagnosis not present

## 2019-11-05 DIAGNOSIS — R928 Other abnormal and inconclusive findings on diagnostic imaging of breast: Secondary | ICD-10-CM | POA: Diagnosis not present

## 2019-11-05 DIAGNOSIS — N6001 Solitary cyst of right breast: Secondary | ICD-10-CM | POA: Diagnosis not present

## 2019-11-10 ENCOUNTER — Encounter: Payer: Self-pay | Admitting: Gastroenterology

## 2019-11-10 ENCOUNTER — Ambulatory Visit (INDEPENDENT_AMBULATORY_CARE_PROVIDER_SITE_OTHER): Payer: Medicare Other | Admitting: Gastroenterology

## 2019-11-10 VITALS — BP 122/74 | HR 83 | Temp 98.3°F | Ht 65.0 in | Wt 169.0 lb

## 2019-11-10 DIAGNOSIS — K5909 Other constipation: Secondary | ICD-10-CM

## 2019-11-10 DIAGNOSIS — K625 Hemorrhage of anus and rectum: Secondary | ICD-10-CM

## 2019-11-10 DIAGNOSIS — K2100 Gastro-esophageal reflux disease with esophagitis, without bleeding: Secondary | ICD-10-CM

## 2019-11-10 DIAGNOSIS — Z86010 Personal history of colonic polyps: Secondary | ICD-10-CM

## 2019-11-10 MED ORDER — FAMOTIDINE 40 MG PO TABS: 40.0000 mg | ORAL_TABLET | Freq: Every day | ORAL | 3 refills | Status: DC

## 2019-11-10 NOTE — Patient Instructions (Signed)
If you are age 74 or older, your body mass index should be between 23-30. Your Body mass index is 28.12 kg/m. If this is out of the aforementioned range listed, please consider follow up with your Primary Care Provider.  If you are age 33 or younger, your body mass index should be between 19-25. Your Body mass index is 28.12 kg/m. If this is out of the aformentioned range listed, please consider follow up with your Primary Care Provider.   It was a pleasure to see you today!  Dr. Loletha Carrow

## 2019-11-10 NOTE — Progress Notes (Signed)
Margaret Graham  Chief Complaint: Reflux esophagitis  Subjective  History: Colonoscopy February 2019 for CT scan suggesting abnormality of rectal wall.  Diverticulosis and diminutive rectal adenoma removed. [place] for food impaction.  Grade a reflux esophagitis and Schatzki ring reportedly dilated with 20 mm balloon.  Patient was not having recurrent dysphagia when she establish care with me, has not had EGD done here.  Has chronic reflux symptoms treated with H2 blocker, which she feels work better than PPIs. [place] indicate colonoscopy January 2015 with diverticulosis and no polyps.  That exam indicated a normal colonoscopy 10 years prior to that.  Margaret Graham has done fairly well since I last saw her.  Her constipation is not as bad, she tries to stick to a particular diet and routine in order to have BMs regularly.  She has not had any recurrence of the severe constipation that plagued her a few years back.  However, at the end of the visit she told me that she has been more bothered over this last year with hemorrhoidal bleeding.  It will occur even when she is not having difficulty with constipation.  Regarding her upper GI symptoms, she takes famotidine 40 mg once daily and does not have heartburn, regurgitation or dysphagia.  She recalls that originally, her symptom was a feeling of esophageal spasm that was the reason she originally started taking acid suppression after the upper endoscopy.  We had previously discussed tapering down the medication or even off to see if she would still have symptom relief, and she cannot recall whether she did that or what may have occurred.  ROS: Cardiovascular:  no chest pain Respiratory: no dyspnea Remainder of systems negative except as above  The patient's Past Medical, Family and Social History were reviewed and are on file in the EMR. She cares for her husband, who has multiple  health issues.  Objective:  Med list reviewed  Current Outpatient Medications:  .  alendronate (FOSAMAX) 70 MG tablet, Take 70 mg by mouth once a week. Take with a full glass of water on an empty stomach., Disp: , Rfl:  .  aspirin 81 MG tablet, Take 81 mg by mouth daily., Disp: , Rfl:  .  Calcium Carb-Cholecalciferol (CALCIUM 600 + D PO), Take by mouth., Disp: , Rfl:  .  ezetimibe (ZETIA) 10 MG tablet, Take 10 mg by mouth daily., Disp: , Rfl:  .  famotidine (PEPCID) 40 MG tablet, Take 1 tablet (40 mg total) by mouth daily., Disp: 90 tablet, Rfl: 3 .  fluticasone (FLONASE) 50 MCG/ACT nasal spray, Place into both nostrils daily., Disp: , Rfl:  .  levothyroxine (SYNTHROID, LEVOTHROID) 100 MCG tablet, Take 1 tablet (100 mcg total) by mouth every morning., Disp: 30 tablet, Rfl: 3 .  Multiple Vitamin (MULTIVITAMIN) capsule, Take 1 capsule by mouth daily., Disp: , Rfl:  .  simvastatin (ZOCOR) 20 MG tablet, Take 1 tablet (20 mg total) by mouth daily., Disp: 30 tablet, Rfl: 0  Current Facility-Administered Medications:  .  0.9 %  sodium chloride infusion, 500 mL, Intravenous, Once, Danis, Estill Cotta III, MD   Vital signs in last 24 hrs: Vitals:   11/10/19 1035  BP: 122/74  Pulse: 83  Temp: 98.3 F (36.8 C)  SpO2: 98%    Physical Exam  Well-appearing  HEENT: sclera anicteric, oral mucosa moist without lesions  Neck: supple, no thyromegaly, JVD or lymphadenopathy  Cardiac: RRR without murmurs, S1S2  heard, no peripheral edema  Pulm: clear to auscultation bilaterally, normal RR and effort noted  Abdomen: soft, no tenderness, with active bowel sounds. No guarding or palpable hepatosplenomegaly.  Skin; warm and dry, no jaundice or rash  No recent data    @ASSESSMENTPLANBEGIN @ Assessment: Encounter Diagnoses  Name Primary?  . Gastroesophageal reflux disease with esophagitis without hemorrhage Yes  . Rectal bleeding   . Chronic constipation   . Personal history of colonic polyps     Her constipation seems under fairly good control.  We discussed her personal history of colon polyp.  She is at low risk of developing colorectal cancer given her history thus far.  We also discussed the updated guidelines last year suggesting that we should at least move her recall to 7 years from 5 years, and at that point reassess her health and willingness to have a colonoscopy.  Recent data suggest there may be marginal benefit in colorectal cancer risk reduction doing surveillance colonoscopies after age 24.  Previously reported nonbleeding reflux esophagitis.  Her dysphagia is likely dysmotility related to reflux, and has been under good control for a long time.  I recommended decreasing the famotidine to 40 mg 1 tablet every other day and see if she still feels well.  If she does not have recurrence of symptoms, stop the medicine and it can be resumed as needed.  New prescription was written today.  While no internal hemorrhoids were noted at the time of last colonoscopy, that is still the most likely explanation for the bleeding and she describes it.  Hemorrhoidal columns tend to fluctuate over time in degree of swelling and symptoms. We had an initial discussion about possible hemorrhoidal banding, diagram of the anatomy was reviewed and a brochure given.  She will consider it and if she decides to proceed, contact us to set up appointment.   Total time 30 minutes, over half spent face-to-face with patient in counseling and coordination of care.   [name]

## 2019-11-18 ENCOUNTER — Ambulatory Visit: Payer: Medicare Other | Attending: Internal Medicine

## 2019-11-18 DIAGNOSIS — Z23 Encounter for immunization: Secondary | ICD-10-CM | POA: Insufficient documentation

## 2019-11-18 NOTE — Progress Notes (Signed)
   Covid-19 Vaccination Clinic  Name:  Margaret Graham    MRN: [id number] DOB: 1946/10/14  11/18/2019  [name] was observed post Covid-19 immunization for 15 minutes without incidence. She was provided with Vaccine Information Sheet and instruction to access the V-Safe system.   [name] was instructed to call 911 with any severe reactions post vaccine: [name] Kitchen Difficulty breathing  . Swelling of your face and throat  . A fast heartbeat  . A bad rash all over your body  . Dizziness and weakness    Immunizations Administered    Name Date Dose VIS Date Route   Pfizer COVID-19 Vaccine 11/18/2019  4:35 PM 0.3 mL 10/09/2019 Intramuscular   Manufacturer: [place]   Lot: [id number]   [place]: [id number]

## 2019-12-09 ENCOUNTER — Ambulatory Visit: Payer: Medicare Other | Attending: Internal Medicine

## 2019-12-09 DIAGNOSIS — Z23 Encounter for immunization: Secondary | ICD-10-CM

## 2019-12-09 NOTE — Progress Notes (Signed)
   Covid-19 Vaccination Clinic  Name:  Margaret Graham    MRN: [id number] DOB: 08-15-1946  12/09/2019  [name] was observed post Covid-19 immunization for 15 minutes without incidence. She was provided with Vaccine Information Sheet and instruction to access the V-Safe system.   [name] was instructed to call 911 with any severe reactions post vaccine: [name] Kitchen Difficulty breathing  . Swelling of your face and throat  . A fast heartbeat  . A bad rash all over your body  . Dizziness and weakness    Immunizations Administered    Name Date Dose VIS Date Route   Pfizer COVID-19 Vaccine 12/09/2019  8:57 AM 0.3 mL 10/09/2019 Intramuscular   Manufacturer: [place]   Lot: [id number]   [place]: [id number]

## 2020-03-31 DIAGNOSIS — D2261 Melanocytic nevi of right upper limb, including shoulder: Secondary | ICD-10-CM | POA: Diagnosis not present

## 2020-03-31 DIAGNOSIS — Z85828 Personal history of other malignant neoplasm of skin: Secondary | ICD-10-CM | POA: Diagnosis not present

## 2020-03-31 DIAGNOSIS — D485 Neoplasm of uncertain behavior of skin: Secondary | ICD-10-CM | POA: Diagnosis not present

## 2020-03-31 DIAGNOSIS — D225 Melanocytic nevi of trunk: Secondary | ICD-10-CM | POA: Diagnosis not present

## 2020-03-31 DIAGNOSIS — D1801 Hemangioma of skin and subcutaneous tissue: Secondary | ICD-10-CM | POA: Diagnosis not present

## 2020-03-31 DIAGNOSIS — D2272 Melanocytic nevi of left lower limb, including hip: Secondary | ICD-10-CM | POA: Diagnosis not present

## 2020-03-31 DIAGNOSIS — L814 Other melanin hyperpigmentation: Secondary | ICD-10-CM | POA: Diagnosis not present

## 2020-03-31 DIAGNOSIS — L218 Other seborrheic dermatitis: Secondary | ICD-10-CM | POA: Diagnosis not present

## 2020-03-31 DIAGNOSIS — L821 Other seborrheic keratosis: Secondary | ICD-10-CM | POA: Diagnosis not present

## 2020-04-11 DIAGNOSIS — M859 Disorder of bone density and structure, unspecified: Secondary | ICD-10-CM | POA: Diagnosis not present

## 2020-04-11 DIAGNOSIS — E7849 Other hyperlipidemia: Secondary | ICD-10-CM | POA: Diagnosis not present

## 2020-04-11 DIAGNOSIS — E038 Other specified hypothyroidism: Secondary | ICD-10-CM | POA: Diagnosis not present

## 2020-04-18 DIAGNOSIS — D126 Benign neoplasm of colon, unspecified: Secondary | ICD-10-CM | POA: Diagnosis not present

## 2020-04-18 DIAGNOSIS — E038 Other specified hypothyroidism: Secondary | ICD-10-CM | POA: Diagnosis not present

## 2020-04-18 DIAGNOSIS — E041 Nontoxic single thyroid nodule: Secondary | ICD-10-CM | POA: Diagnosis not present

## 2020-04-18 DIAGNOSIS — R7989 Other specified abnormal findings of blood chemistry: Secondary | ICD-10-CM | POA: Diagnosis not present

## 2020-04-18 DIAGNOSIS — Z0000 Encounter for general adult medical examination without abnormal findings: Secondary | ICD-10-CM | POA: Diagnosis not present

## 2020-04-18 DIAGNOSIS — I700 Atherosclerosis of aorta: Secondary | ICD-10-CM | POA: Diagnosis not present

## 2020-04-18 DIAGNOSIS — E7849 Other hyperlipidemia: Secondary | ICD-10-CM | POA: Diagnosis not present

## 2020-04-18 DIAGNOSIS — Z1389 Encounter for screening for other disorder: Secondary | ICD-10-CM | POA: Diagnosis not present

## 2020-04-18 DIAGNOSIS — M810 Age-related osteoporosis without current pathological fracture: Secondary | ICD-10-CM | POA: Diagnosis not present

## 2020-04-19 DIAGNOSIS — D485 Neoplasm of uncertain behavior of skin: Secondary | ICD-10-CM | POA: Diagnosis not present

## 2020-04-19 DIAGNOSIS — L988 Other specified disorders of the skin and subcutaneous tissue: Secondary | ICD-10-CM | POA: Diagnosis not present

## 2020-05-12 DIAGNOSIS — H903 Sensorineural hearing loss, bilateral: Secondary | ICD-10-CM | POA: Diagnosis not present

## 2020-05-24 ENCOUNTER — Ambulatory Visit (INDEPENDENT_AMBULATORY_CARE_PROVIDER_SITE_OTHER): Payer: Medicare Other | Admitting: Gastroenterology

## 2020-05-24 ENCOUNTER — Encounter: Payer: Self-pay | Admitting: Gastroenterology

## 2020-05-24 VITALS — BP 124/60 | HR 84 | Ht 64.5 in | Wt 162.4 lb

## 2020-05-24 DIAGNOSIS — K648 Other hemorrhoids: Secondary | ICD-10-CM

## 2020-05-24 DIAGNOSIS — K5909 Other constipation: Secondary | ICD-10-CM

## 2020-05-24 DIAGNOSIS — K625 Hemorrhage of anus and rectum: Secondary | ICD-10-CM

## 2020-05-24 NOTE — Patient Instructions (Addendum)
If you are age 74 or older, your body mass index should be between 23-30. Your Body mass index is 27.44 kg/m. If this is out of the aforementioned range listed, please consider follow up with your Primary Care Provider.  If you are age 97 or younger, your body mass index should be between 19-25. Your Body mass index is 27.44 kg/m. If this is out of the aformentioned range listed, please consider follow up with your Primary Care Provider.   It was a pleasure to see you today!  Dr. Loletha Carrow   HEMORRHOID BANDING PROCEDURE    FOLLOW-UP CARE   1. The procedure you have had should have been relatively painless since the banding of the area involved does not have nerve endings and there is no pain sensation.  The rubber band cuts off the blood supply to the hemorrhoid and the band may fall off as soon as 48 hours after the banding (the band may occasionally be seen in the toilet bowl following a bowel movement). You may notice a temporary feeling of fullness in the rectum which should respond adequately to plain Tylenol or Motrin.  2. Following the banding, avoid strenuous exercise that evening and resume full activity the next day.  A sitz bath (soaking in a warm tub) or bidet is soothing, and can be useful for cleansing the area after bowel movements.     3. To avoid constipation, take two tablespoons of natural wheat bran, natural oat bran, flax, Benefiber or any over the counter fiber supplement and increase your water intake to 7-8 glasses daily.    4. Unless you have been prescribed anorectal medication, do not put anything inside your rectum for two weeks: No suppositories, enemas, fingers, etc.  5. Occasionally, you may have more bleeding than usual after the banding procedure.  This is often from the untreated hemorrhoids rather than the treated one.  Don't be concerned if there is a tablespoon or so of blood.  If there is more blood than this, lie flat with your bottom higher than your  head and apply an ice pack to the area. If the bleeding does not stop within a half an hour or if you feel faint, call our office at [phone] or go to the emergency room.  6. Problems are not common; however, if there is a substantial amount of bleeding, severe pain, chills, fever or difficulty passing urine (very rare) or other problems, you should call us at [phone] or report to the nearest emergency room.  7. Do not stay seated continuously for more than 2-3 hours for a day or two after the procedure.  Tighten your buttock muscles 10-15 times every two hours and take 10-15 deep breaths every 1-2 hours.  Do not spend more than a few minutes on the toilet if you cannot empty your bowel; instead re-visit the toilet at a later time.

## 2020-05-24 NOTE — Progress Notes (Signed)
[facility] Progress Note  Chief Complaint: Symptomatic internal hemorrhoids  Subjective  History: Last visit 11/10/2019, GERD, [place] esophageal [place] state.  Intermittent constipation and bleeding sounding likely to be hemorrhoidal (though none seen on last colonoscopy with me Feb 2019)  Margaret Graham still has episodic constipation, and has symptomatic hemorrhoids with occasional blood on the paper, but also some prolapse and seepage.  She does not get frank rectal bleeding.  ROS: Cardiovascular:  no chest pain Respiratory: no dyspnea  The patient's Past Medical, Family and Social History were reviewed and are on file in the EMR.  Objective:  Med list reviewed  Current Outpatient Medications:  .  alendronate (FOSAMAX) 70 MG tablet, Take 70 mg by mouth once a week. Take with a full glass of water on an empty stomach., Disp: , Rfl:  .  aspirin 81 MG tablet, Take 81 mg by mouth daily., Disp: , Rfl:  .  Calcium Carb-Cholecalciferol (CALCIUM 600 + D PO), Take by mouth., Disp: , Rfl:  .  ezetimibe (ZETIA) 10 MG tablet, Take 10 mg by mouth daily., Disp: , Rfl:  .  famotidine (PEPCID) 40 MG tablet, Take 1 tablet (40 mg total) by mouth daily., Disp: 90 tablet, Rfl: 3 .  fluticasone (FLONASE) 50 MCG/ACT nasal spray, Place into both nostrils daily., Disp: , Rfl:  .  levothyroxine (SYNTHROID, LEVOTHROID) 100 MCG tablet, Take 1 tablet (100 mcg total) by mouth every morning., Disp: 30 tablet, Rfl: 3 .  Multiple Vitamin (MULTIVITAMIN) capsule, Take 1 capsule by mouth daily., Disp: , Rfl:  .  simvastatin (ZOCOR) 20 MG tablet, Take 1 tablet (20 mg total) by mouth daily., Disp: 30 tablet, Rfl: 0  Current Facility-Administered Medications:  .  0.9 %  sodium chloride infusion, 500 mL, Intravenous, Once, Danis, Kirke Corin, MD   Vital signs in last 24 hrs: Vitals:   05/24/20 1311  BP: (!) 124/60  Pulse: 84   (She was tearful at times during today's visit -her son  recently died after a long illness)  Physical Exam    Cardiac: RRR without murmurs, S1S2 heard, no peripheral edema  Pulm: clear to auscultation bilaterally, normal RR and effort noted  Abdomen: soft, no tenderness, with active bowel sounds. No guarding or palpable hepatosplenomegaly.  Skin; warm and dry, no jaundice or rash Anoscopy:  Int HR, esp RP  Labs:   ___________________________________________ Radiologic studies:   ____________________________________________ Other:   _____________________________________________ Assessment & Plan  Assessment: Encounter Diagnoses  Name Primary?  . Chronic constipation Yes  . Rectal bleeding    Symptomatic internal hemorrhoids, more localized symptoms then much bleeding. Banding offered, diagram anatomy and nature procedure reviewed.  She was agreeable after risks and benefits reviewed  PROCEDURE NOTE: The patient presents with symptomatic grade 2 hemorrhoids, requesting rubber band ligation of his/her hemorrhoidal disease. All risks, benefits and alternative forms of therapy were described and informed consent was obtained.   The anorectum was pre-medicated with 0.125% NTG and lubricant. The decision was made to band the RP internal hemorrhoids, and the [place] was used to perform band ligation without complication. Digital anorectal examination was then performed to assure proper positioning of the band, and to adjust the banded tissue as required. The patient was discharged home without pain or other issues. Dietary and behavioral recommendations were given and along with follow-up instructions.   The following adjunctive treatments were recommended:  none  The patient will return several weeks or  next available for follow-up and possible additional banding as required. No complications were encountered and the patient tolerated the procedure well.   Margaret Graham

## 2020-06-10 DIAGNOSIS — J40 Bronchitis, not specified as acute or chronic: Secondary | ICD-10-CM | POA: Diagnosis not present

## 2020-06-10 DIAGNOSIS — R05 Cough: Secondary | ICD-10-CM | POA: Diagnosis not present

## 2020-06-21 DIAGNOSIS — H524 Presbyopia: Secondary | ICD-10-CM | POA: Diagnosis not present

## 2020-06-21 DIAGNOSIS — H2513 Age-related nuclear cataract, bilateral: Secondary | ICD-10-CM | POA: Diagnosis not present

## 2020-06-28 DIAGNOSIS — R0683 Snoring: Secondary | ICD-10-CM | POA: Diagnosis not present

## 2020-06-28 DIAGNOSIS — G478 Other sleep disorders: Secondary | ICD-10-CM | POA: Diagnosis not present

## 2020-08-16 DIAGNOSIS — Z23 Encounter for immunization: Secondary | ICD-10-CM | POA: Diagnosis not present

## 2020-08-24 ENCOUNTER — Ambulatory Visit (INDEPENDENT_AMBULATORY_CARE_PROVIDER_SITE_OTHER): Payer: Medicare Other | Admitting: Gastroenterology

## 2020-08-24 ENCOUNTER — Encounter: Payer: Self-pay | Admitting: Gastroenterology

## 2020-08-24 VITALS — BP 140/70 | HR 71 | Ht 65.0 in | Wt 159.0 lb

## 2020-08-24 DIAGNOSIS — K648 Other hemorrhoids: Secondary | ICD-10-CM | POA: Diagnosis not present

## 2020-08-24 DIAGNOSIS — R194 Change in bowel habit: Secondary | ICD-10-CM

## 2020-08-24 NOTE — Progress Notes (Signed)
     Margaret Graham  Chief Complaint: Altered bowel habits, internal hemorrhoids  Subjective  History:  I saw Margaret Graham in July for symptoms of difficulty getting clean after bowel movements, and feeling of a protrusion in the area.  Anoscopy revealed internal hemorrhoids, primarily RP column.  I thought perhaps she was having prolapse causing seepage. She clarified that she does not have seepage or incontinence.  After bowel movement she just thinks it takes a long time with a lot of wiping to get clean.  She needs to have a bowel movement every time she sits to urinate, several times a day.  Usually it is a small volume stool and soft.  After the last hemorrhoidal banding she had crampy lower abdominal pain for several days.  She has not had bleeding. ROS: Cardiovascular:  no chest pain Respiratory: no dyspnea  The patient's Past Medical, Family and Social History were reviewed and are on file in the EMR.  Objective:  Med list reviewed  Current Outpatient Medications:  .  alendronate (FOSAMAX) 70 MG tablet, Take 70 mg by mouth once a week. Take with a full glass of water on an empty stomach., Disp: , Rfl:  .  aspirin 81 MG tablet, Take 81 mg by mouth daily., Disp: , Rfl:  .  Calcium Carb-Cholecalciferol (CALCIUM 600 + D PO), Take by mouth., Disp: , Rfl:  .  ezetimibe (ZETIA) 10 MG tablet, Take 10 mg by mouth daily., Disp: , Rfl:  .  famotidine (PEPCID) 40 MG tablet, Take 1 tablet (40 mg total) by mouth daily., Disp: 90 tablet, Rfl: 3 .  fluticasone (FLONASE) 50 MCG/ACT nasal spray, Place into both nostrils daily., Disp: , Rfl:  .  levothyroxine (SYNTHROID, LEVOTHROID) 100 MCG tablet, Take 1 tablet (100 mcg total) by mouth every morning., Disp: 30 tablet, Rfl: 3 .  Multiple Vitamin (MULTIVITAMIN) capsule, Take 1 capsule by mouth daily., Disp: , Rfl:  .  simvastatin (ZOCOR) 20 MG tablet, Take 1 tablet (20 mg total) by mouth daily., Disp: 30 tablet, Rfl: 0  Current  Facility-Administered Medications:  .  0.9 %  sodium chloride infusion, 500 mL, Intravenous, Once, Danis, Estill Cotta III, MD   Vital signs in last 24 hrs: Vitals:   08/24/20 1613  BP: 140/70  Pulse: 71    Physical Exam   Cardiac: RRR without murmurs, S1S2 heard, no peripheral edema  Pulm: clear to auscultation bilaterally, normal RR and effort noted  Abdomen: soft, no tenderness, with active bowel sounds. No guarding or palpable hepatosplenomegaly.  Skin; warm and dry, no jaundice or rash Rectal.  Mildly prominent external hemorrhoid column, not inflamed or tender or thrombosed.  Normal DRE, no fissure no palpable internal lesions. Labs:   ___________________________________________ Radiologic studies:   ____________________________________________ Other:   _____________________________________________ Assessment & Plan  Assessment: Encounter Diagnoses  Name Primary?  . Internal hemorrhoids Yes  . Change in bowel habits    Having not had improvement after the banding of really the only prominent hemorrhoidal column, I do not think it makes sense to do further banding.  She has anorectal motility issues causing these bowel problems. Recommended a daily fiber supplement to help bulk the stool and perhaps get more complete and regular evacuation.  See me as needed.  20 minutes were spent on this encounter (including chart review, history/exam, counseling/coordination of care, and documentation)  [name]

## 2020-08-24 NOTE — Patient Instructions (Signed)
If you are age 74 or older, your body mass index should be between 23-30. Your Body mass index is 26.46 kg/m. If this is out of the aforementioned range listed, please consider follow up with your Primary Care Provider.  If you are age 65 or younger, your body mass index should be between 19-25. Your Body mass index is 26.46 kg/m. If this is out of the aformentioned range listed, please consider follow up with your Primary Care Provider.   It was a pleasure to see you today!  Dr. Loletha Carrow

## 2020-08-26 DIAGNOSIS — Z23 Encounter for immunization: Secondary | ICD-10-CM | POA: Diagnosis not present

## 2020-09-27 ENCOUNTER — Encounter (HOSPITAL_BASED_OUTPATIENT_CLINIC_OR_DEPARTMENT_OTHER): Payer: Self-pay | Admitting: *Deleted

## 2020-09-27 ENCOUNTER — Emergency Department (HOSPITAL_BASED_OUTPATIENT_CLINIC_OR_DEPARTMENT_OTHER): Payer: Medicare Other

## 2020-09-27 ENCOUNTER — Other Ambulatory Visit: Payer: Self-pay

## 2020-09-27 ENCOUNTER — Emergency Department (HOSPITAL_BASED_OUTPATIENT_CLINIC_OR_DEPARTMENT_OTHER)
Admission: EM | Admit: 2020-09-27 | Discharge: 2020-09-27 | Disposition: A | Discharge status: Home / Self Care | Payer: Medicare Other | Attending: Emergency Medicine | Admitting: Emergency Medicine

## 2020-09-27 DIAGNOSIS — N10 Acute tubulo-interstitial nephritis: Secondary | ICD-10-CM

## 2020-09-27 DIAGNOSIS — I700 Atherosclerosis of aorta: Secondary | ICD-10-CM | POA: Diagnosis not present

## 2020-09-27 DIAGNOSIS — N12 Tubulo-interstitial nephritis, not specified as acute or chronic: Secondary | ICD-10-CM | POA: Diagnosis not present

## 2020-09-27 DIAGNOSIS — K219 Gastro-esophageal reflux disease without esophagitis: Secondary | ICD-10-CM | POA: Insufficient documentation

## 2020-09-27 DIAGNOSIS — Z79899 Other long term (current) drug therapy: Secondary | ICD-10-CM | POA: Diagnosis not present

## 2020-09-27 DIAGNOSIS — R109 Unspecified abdominal pain: Secondary | ICD-10-CM | POA: Diagnosis not present

## 2020-09-27 DIAGNOSIS — Z7982 Long term (current) use of aspirin: Secondary | ICD-10-CM | POA: Insufficient documentation

## 2020-09-27 DIAGNOSIS — K5730 Diverticulosis of large intestine without perforation or abscess without bleeding: Secondary | ICD-10-CM | POA: Diagnosis not present

## 2020-09-27 DIAGNOSIS — Z90710 Acquired absence of both cervix and uterus: Secondary | ICD-10-CM | POA: Diagnosis not present

## 2020-09-27 DIAGNOSIS — R112 Nausea with vomiting, unspecified: Secondary | ICD-10-CM | POA: Diagnosis not present

## 2020-09-27 DIAGNOSIS — M545 Low back pain, unspecified: Secondary | ICD-10-CM | POA: Diagnosis not present

## 2020-09-27 DIAGNOSIS — N1330 Unspecified hydronephrosis: Secondary | ICD-10-CM | POA: Diagnosis not present

## 2020-09-27 LAB — URINALYSIS, ROUTINE W REFLEX MICROSCOPIC
Bilirubin Urine: NEGATIVE
Glucose, UA: NEGATIVE mg/dL
Ketones, ur: >80 mg/dL — AB
Nitrite: POSITIVE — AB
Protein, ur: 100 mg/dL — AB
Specific Gravity, Urine: 1.025 (ref 1.005–1.030)
pH: 6 (ref 5.0–8.0)

## 2020-09-27 LAB — COMPREHENSIVE METABOLIC PANEL
ALT: 27 U/L (ref 0–44)
AST: 17 U/L (ref 15–41)
Albumin: 3.7 g/dL (ref 3.5–5.0)
Alkaline Phosphatase: 67 U/L (ref 38–126)
Anion gap: 6 (ref 5–15)
BUN: 16 mg/dL (ref 8–23)
CO2: 27 mmol/L (ref 22–32)
Calcium: 9.1 mg/dL (ref 8.9–10.3)
Chloride: 103 mmol/L (ref 98–111)
Creatinine, Ser: 0.93 mg/dL (ref 0.44–1.00)
GFR, Estimated: >60 mL/min (ref >60)
Glucose, Bld: 93 mg/dL (ref 70–99)
Potassium: 4.1 mmol/L (ref 3.5–5.1)
Sodium: 136 mmol/L (ref 135–145)
Total Bilirubin: 0.4 mg/dL (ref 0.3–1.2)
Total Protein: 7.1 g/dL (ref 6.5–8.1)

## 2020-09-27 LAB — URINALYSIS, MICROSCOPIC (REFLEX)

## 2020-09-27 LAB — CBC WITH DIFFERENTIAL/PLATELET
Abs Immature Granulocytes: 0.09 10*3/uL — ABNORMAL HIGH (ref 0.00–0.07)
Basophils Absolute: 0.1 10*3/uL (ref 0.0–0.1)
Basophils Relative: 0 %
Eosinophils Absolute: 0.1 10*3/uL (ref 0.0–0.5)
Eosinophils Relative: 1 %
HCT: 41.7 % (ref 36.0–46.0)
Hemoglobin: 13.4 g/dL (ref 12.0–15.0)
Immature Granulocytes: 1 %
Lymphocytes Relative: 13 %
Lymphs Abs: 1.9 10*3/uL (ref 0.7–4.0)
MCH: 29.1 pg (ref 26.0–34.0)
MCHC: 32.1 g/dL (ref 30.0–36.0)
MCV: 90.7 fL (ref 80.0–100.0)
Monocytes Absolute: 1.7 10*3/uL — ABNORMAL HIGH (ref 0.1–1.0)
Monocytes Relative: 12 %
Neutro Abs: 10.2 10*3/uL — ABNORMAL HIGH (ref 1.7–7.7)
Neutrophils Relative %: 73 %
Platelets: 336 10*3/uL (ref 150–400)
RBC: 4.6 MIL/uL (ref 3.87–5.11)
RDW: 13.3 % (ref 11.5–15.5)
WBC: 14 10*3/uL — ABNORMAL HIGH (ref 4.0–10.5)
nRBC: 0 % (ref 0.0–0.2)

## 2020-09-27 LAB — LIPASE, BLOOD: Lipase: 20 U/L (ref 11–51)

## 2020-09-27 MED ORDER — SODIUM CHLORIDE 0.9 % IV SOLN
1.0000 g | Freq: Once | INTRAVENOUS | Status: AC
  Administered 2020-09-27: 1 g via INTRAVENOUS
  Filled 2020-09-27: qty 10

## 2020-09-27 MED ORDER — CEFUROXIME AXETIL 500 MG PO TABS: 500.0000 mg | ORAL_TABLET | Freq: Two times a day (BID) | ORAL | 0 refills | Status: AC

## 2020-09-27 MED ORDER — SODIUM CHLORIDE 0.9 % IV SOLN
INTRAVENOUS | Status: DC | PRN
  Administered 2020-09-27: 250 mL via INTRAVENOUS

## 2020-09-27 NOTE — Discharge Instructions (Signed)
Please return to the emergency department if you develop severe nausea and vomiting, fever, chills.  Take oral antibiotics as I suspect that you have a kidney infection.  Follow-up with your primary care doctor for reevaluation later this week or early next week.  Take antibiotics as prescribed.

## 2020-09-27 NOTE — ED Triage Notes (Signed)
3 days of right flank pain. Urinary retention since that time. She is constipation. Body aches.

## 2020-09-27 NOTE — ED Provider Notes (Signed)
[name]   CSN: [id number] Arrival date & time: 09/27/20  1426     History Chief Complaint  Patient presents with  . Abdominal Pain    Margaret Graham is a 74 y.o. female.  The history is provided by the patient.  Abdominal Pain Pain location:  R flank Pain quality: aching   Pain radiates to:  Does not radiate Pain severity:  Mild Onset quality:  Gradual Timing:  Intermittent Progression:  Waxing and waning Chronicity:  New Context: not recent illness   Relieved by:  Nothing Worsened by:  Nothing Associated symptoms: dysuria and nausea   Associated symptoms: no chest pain, no chills, no cough, no diarrhea, no fever, no hematuria, no shortness of breath, no sore throat and no vomiting   Risk factors: pregnancy   Risk factors: no alcohol abuse        Past Medical History:  Diagnosis Date  . Arthritis   . GERD (gastroesophageal reflux disease)   . Heart murmur   . Hyperlipidemia   . Internal hemorrhoid   . Schatzki's ring   . Thyroid disease     Patient Active Problem List   Diagnosis Date Noted  . Osteopenia 01/23/2016  . Dyslipidemia 01/18/2016  . History of esophageal stricture 01/18/2016  . Postoperative hypothyroidism 01/18/2016  . Esophageal reflux 01/18/2016  . Snoring 01/18/2016    Past Surgical History:  Procedure Laterality Date  . ABDOMINAL HYSTERECTOMY    . BREAST SURGERY     for dilated duct     OB History   No obstetric history on file.     Family History  Problem Relation Age of Onset  . Arthritis Mother   . Arthritis Father   . Hyperlipidemia Father   . Diabetes Son   . Asthma Son   . Heart disease Paternal Uncle   . Colon cancer Neg Hx   . Esophageal cancer Neg Hx   . Rectal cancer Neg Hx     Social History   Tobacco Use  . Smoking status: Never Smoker  . Smokeless tobacco: Never Used  Vaping Use  . Vaping Use: Never used  Substance Use Topics  . Alcohol  use: Yes    Alcohol/week: 0.0 standard drinks  . Drug use: No    Home Medications Prior to Admission medications   Medication Sig Start Date End Date Taking? Authorizing Provider  alendronate (FOSAMAX) 70 MG tablet Take 70 mg by mouth once a week. Take with a full glass of water on an empty stomach.    [provider]  aspirin 81 MG tablet Take 81 mg by mouth daily.    [provider]  Calcium Carb-Cholecalciferol (CALCIUM 600 + D PO) Take by mouth.    [provider]  cefUROXime (CEFTIN) 500 MG tablet Take 1 tablet (500 mg total) by mouth 2 (two) times daily with a meal for 14 days. 09/27/20 10/11/20  Deberah Adolf, DO  ezetimibe (ZETIA) 10 MG tablet Take 10 mg by mouth daily.    [provider]  famotidine (PEPCID) 40 MG tablet Take 1 tablet (40 mg total) by mouth daily. 11/10/19   Doran Stabler, MD  fluticasone (FLONASE) 50 MCG/ACT nasal spray Place into both nostrils daily.    [provider]  levothyroxine (SYNTHROID, LEVOTHROID) 100 MCG tablet Take 1 tablet (100 mcg total) by mouth every morning. 08/05/16   Copland, Gay Filler, MD  Multiple Vitamin (MULTIVITAMIN) capsule Take 1  capsule by mouth daily.    [provider]  simvastatin (ZOCOR) 20 MG tablet Take 1 tablet (20 mg total) by mouth daily. 10/24/16   Copland, Gay Filler, MD    Allergies    Declomycin [demeclocycline]  Review of Systems   Review of Systems  Constitutional: Negative for chills and fever.  HENT: Negative for ear pain and sore throat.   Eyes: Negative for pain and visual disturbance.  Respiratory: Negative for cough and shortness of breath.   Cardiovascular: Negative for chest pain and palpitations.  Gastrointestinal: Positive for abdominal pain and nausea. Negative for diarrhea and vomiting.  Genitourinary: Positive for decreased urine volume, difficulty urinating, dysuria and urgency. Negative for hematuria.  Musculoskeletal: Negative for arthralgias  and back pain.  Skin: Negative for color change and rash.  Neurological: Negative for seizures and syncope.  All other systems reviewed and are negative.   Physical Exam Updated Vital Signs  ED Triage Vitals  Enc Vitals Group     BP 09/27/20 1432 117/64     Pulse Rate 09/27/20 1432 80     Resp 09/27/20 1432 18     Temp 09/27/20 1432 98.5 F (36.9 C)     Temp Source 09/27/20 1432 Oral     SpO2 09/27/20 1432 100 %     Weight 09/27/20 1432 154 lb (69.9 kg)     Height 09/27/20 1432 5\' 5"  (1.651 m)     Head Circumference --      Peak Flow --      Pain Score 09/27/20 1435 2     Pain Loc --      Pain Edu? --      Excl. in [place]? --     Physical Exam Vitals and nursing note reviewed.  Constitutional:      General: She is not in acute distress.    Appearance: She is well-developed.  HENT:     Head: Normocephalic and atraumatic.  Eyes:     Extraocular Movements: Extraocular movements intact.     Conjunctiva/sclera: Conjunctivae normal.     Pupils: Pupils are equal, round, and reactive to light.  Cardiovascular:     Rate and Rhythm: Normal rate and regular rhythm.     Heart sounds: Normal heart sounds. No murmur heard.   Pulmonary:     Effort: Pulmonary effort is normal. No respiratory distress.     Breath sounds: Normal breath sounds.  Abdominal:     Palpations: Abdomen is soft.     Tenderness: There is no abdominal tenderness. There is right CVA tenderness. There is no guarding or rebound.  Musculoskeletal:     Cervical back: Neck supple.  Skin:    General: Skin is warm and dry.     Capillary Refill: Capillary refill takes less than 2 seconds.  Neurological:     General: No focal deficit present.     Mental Status: She is alert.     ED Results / Procedures / Treatments   Labs (all labs ordered are listed, but only abnormal results are displayed) Labs Reviewed  URINALYSIS, ROUTINE W REFLEX MICROSCOPIC - Abnormal; Notable for the following components:      Result  Value   APPearance CLOUDY (*)    Hgb urine dipstick MODERATE (*)    Ketones, ur >80 (*)    Protein, ur 100 (*)    Nitrite POSITIVE (*)    Leukocytes,Ua MODERATE (*)    All other components within normal limits  CBC WITH DIFFERENTIAL/PLATELET -  Abnormal; Notable for the following components:   WBC 14.0 (*)    Neutro Abs 10.2 (*)    Monocytes Absolute 1.7 (*)    Abs Immature Granulocytes 0.09 (*)    All other components within normal limits  URINALYSIS, MICROSCOPIC (REFLEX) - Abnormal; Notable for the following components:   Bacteria, UA MANY (*)    All other components within normal limits  COMPREHENSIVE METABOLIC PANEL  LIPASE, BLOOD    EKG None  Radiology CT Renal Stone Study  Result Date: 09/27/2020 CLINICAL DATA:  Right flank pain for 3 days. EXAM: CT ABDOMEN AND PELVIS WITHOUT CONTRAST TECHNIQUE: Multidetector CT imaging of the abdomen and pelvis was performed following the standard protocol without IV contrast. COMPARISON:  11/19/2017 FINDINGS: Lower chest: No acute findings. Hepatobiliary: No mass visualized on this unenhanced exam. Gallbladder is unremarkable. No evidence of biliary ductal dilatation. Pancreas: No mass or inflammatory process visualized on this unenhanced exam. Spleen:  Within normal limits in size. Adrenals/Urinary tract: Right renal swelling and perinephric stranding is noted with moderate right hydroureteronephrosis, however there is no evidence of ureteral or bladder calculi. Stomach/Bowel: No evidence of obstruction, inflammatory process, or abnormal fluid collections. Left-sided colonic diverticulosis noted, however there is no evidence of diverticulitis. Normal appendix visualized. Vascular/Lymphatic: No pathologically enlarged lymph nodes identified. No evidence of abdominal aortic aneurysm. Aortic atherosclerotic calcification noted. Reproductive: Prior hysterectomy noted. Adnexal regions are unremarkable in appearance. Other:  None. Musculoskeletal:  No  suspicious bone lesions identified. IMPRESSION: Right renal swelling and perinephric stranding, with moderate right hydroureteronephrosis. No evidence of ureteral calculi or other obstructing etiology. Differential diagnosis includes pyelonephritis and recently passed urinary calculus. Recommend correlation with urinalysis. Colonic diverticulosis, without radiographic evidence of diverticulitis. Aortic Atherosclerosis (ICD10-I70.0). Electronically Signed   By: Marlaine Hind M.D.   On: 09/27/2020 16:48    Procedures Procedures (including critical care time)  Medications Ordered in ED Medications  cefTRIAXone (ROCEPHIN) 1 g in sodium chloride 0.9 % 100 mL IVPB (has no administration in time range)    ED Course  I have reviewed the triage vital signs and the nursing notes.  Pertinent labs & imaging results that were available during my care of the patient were reviewed by me and considered in my medical decision making (see chart for details).    MDM Rules/Calculators/A&P                          Anida Deol is a 74 year old female with no significant medical history presents the ED with right flank pain, dysuria.  Normal vitals.  No fever.  Pain for the last several days.  Possibly kidney stone versus pyelonephritis.  Denies any real focal abdominal pain on exam.  Was having some urinary frequency and urgency but that is improving.  No history of kidney stones.  CT scan shows likely pyelonephritis.  Possibly recently passed kidney stone.  Urinalysis consistent with infection.  No significant leukocytosis, anemia, electrolyte abnormality otherwise.  No concern for sepsis given no fever, no tachycardia and that patient is overall well-appearing.  Not having any intense nausea or vomiting.  Was given a dose of IV Rocephin and will treat with oral antibiotics for presumed pyelonephritis.  Understands return precautions and discharged in ED in good condition.  No concern for sepsis at this  time.  This chart was dictated using voice recognition software.  Despite best efforts to proofread,  errors can occur which can change the documentation meaning.  Final Clinical Impression(s) / ED Diagnoses Final diagnoses:  Acute pyelonephritis    Rx / DC Orders ED Discharge Orders         Ordered    cefUROXime (CEFTIN) 500 MG tablet  2 times daily with meals        09/27/20 1657           Lennice Sites, DO 09/27/20 1713

## 2020-09-27 NOTE — ED Notes (Signed)
Pt. Has 67ml urine in bladder at this time.

## 2020-10-03 DIAGNOSIS — E041 Nontoxic single thyroid nodule: Secondary | ICD-10-CM | POA: Diagnosis not present

## 2020-10-03 DIAGNOSIS — M810 Age-related osteoporosis without current pathological fracture: Secondary | ICD-10-CM | POA: Diagnosis not present

## 2020-10-03 DIAGNOSIS — E785 Hyperlipidemia, unspecified: Secondary | ICD-10-CM | POA: Diagnosis not present

## 2020-10-03 DIAGNOSIS — E039 Hypothyroidism, unspecified: Secondary | ICD-10-CM | POA: Diagnosis not present

## 2020-10-03 DIAGNOSIS — D126 Benign neoplasm of colon, unspecified: Secondary | ICD-10-CM | POA: Diagnosis not present

## 2020-10-03 DIAGNOSIS — I700 Atherosclerosis of aorta: Secondary | ICD-10-CM | POA: Diagnosis not present

## 2020-10-03 DIAGNOSIS — N12 Tubulo-interstitial nephritis, not specified as acute or chronic: Secondary | ICD-10-CM | POA: Diagnosis not present

## 2020-11-01 ENCOUNTER — Other Ambulatory Visit: Payer: Self-pay | Admitting: Gastroenterology

## 2020-11-09 DIAGNOSIS — N130 Hydronephrosis with ureteropelvic junction obstruction: Secondary | ICD-10-CM | POA: Diagnosis not present

## 2020-11-10 ENCOUNTER — Other Ambulatory Visit: Payer: Self-pay

## 2020-11-10 MED ORDER — FAMOTIDINE 40 MG PO TABS
40.0000 mg | ORAL_TABLET | Freq: Every day | ORAL | 1 refills | Status: DC
Start: 2020-11-10 — End: 2021-06-08

## 2020-11-16 DIAGNOSIS — Z20822 Contact with and (suspected) exposure to covid-19: Secondary | ICD-10-CM | POA: Diagnosis not present

## 2020-11-23 DIAGNOSIS — Z1152 Encounter for screening for COVID-19: Secondary | ICD-10-CM | POA: Diagnosis not present

## 2020-11-23 DIAGNOSIS — R059 Cough, unspecified: Secondary | ICD-10-CM | POA: Diagnosis not present

## 2020-11-23 DIAGNOSIS — U071 COVID-19: Secondary | ICD-10-CM | POA: Diagnosis not present

## 2021-02-08 DIAGNOSIS — Z79899 Other long term (current) drug therapy: Secondary | ICD-10-CM | POA: Diagnosis not present

## 2021-02-08 DIAGNOSIS — M810 Age-related osteoporosis without current pathological fracture: Secondary | ICD-10-CM | POA: Diagnosis not present

## 2021-02-08 DIAGNOSIS — Z8262 Family history of osteoporosis: Secondary | ICD-10-CM | POA: Diagnosis not present

## 2021-02-08 DIAGNOSIS — E039 Hypothyroidism, unspecified: Secondary | ICD-10-CM | POA: Diagnosis not present

## 2021-02-08 DIAGNOSIS — Z5181 Encounter for therapeutic drug level monitoring: Secondary | ICD-10-CM | POA: Diagnosis not present

## 2021-02-08 DIAGNOSIS — K219 Gastro-esophageal reflux disease without esophagitis: Secondary | ICD-10-CM | POA: Diagnosis not present

## 2021-02-16 ENCOUNTER — Other Ambulatory Visit: Payer: Self-pay | Admitting: *Deleted

## 2021-02-16 DIAGNOSIS — M810 Age-related osteoporosis without current pathological fracture: Secondary | ICD-10-CM

## 2021-02-23 ENCOUNTER — Other Ambulatory Visit: Payer: Self-pay | Admitting: Nurse Practitioner

## 2021-02-23 DIAGNOSIS — M810 Age-related osteoporosis without current pathological fracture: Secondary | ICD-10-CM

## 2021-03-01 DIAGNOSIS — N130 Hydronephrosis with ureteropelvic junction obstruction: Secondary | ICD-10-CM | POA: Diagnosis not present

## 2021-03-01 DIAGNOSIS — N1330 Unspecified hydronephrosis: Secondary | ICD-10-CM | POA: Diagnosis not present

## 2021-03-01 DIAGNOSIS — N281 Cyst of kidney, acquired: Secondary | ICD-10-CM | POA: Diagnosis not present

## 2021-03-03 DIAGNOSIS — M533 Sacrococcygeal disorders, not elsewhere classified: Secondary | ICD-10-CM | POA: Diagnosis not present

## 2021-03-20 ENCOUNTER — Other Ambulatory Visit: Payer: Self-pay

## 2021-03-20 ENCOUNTER — Ambulatory Visit
Admission: RE | Admit: 2021-03-20 | Discharge: 2021-03-20 | Disposition: A | Discharge status: Home / Self Care | Payer: Medicare Other | Source: Ambulatory Visit | Attending: Nurse Practitioner | Admitting: Nurse Practitioner

## 2021-03-20 DIAGNOSIS — M85852 Other specified disorders of bone density and structure, left thigh: Secondary | ICD-10-CM | POA: Diagnosis not present

## 2021-03-20 DIAGNOSIS — M810 Age-related osteoporosis without current pathological fracture: Secondary | ICD-10-CM

## 2021-03-20 DIAGNOSIS — Z780 Asymptomatic menopausal state: Secondary | ICD-10-CM | POA: Diagnosis not present

## 2021-03-31 DIAGNOSIS — L570 Actinic keratosis: Secondary | ICD-10-CM | POA: Diagnosis not present

## 2021-03-31 DIAGNOSIS — L814 Other melanin hyperpigmentation: Secondary | ICD-10-CM | POA: Diagnosis not present

## 2021-03-31 DIAGNOSIS — Z85828 Personal history of other malignant neoplasm of skin: Secondary | ICD-10-CM | POA: Diagnosis not present

## 2021-03-31 DIAGNOSIS — D2372 Other benign neoplasm of skin of left lower limb, including hip: Secondary | ICD-10-CM | POA: Diagnosis not present

## 2021-03-31 DIAGNOSIS — D485 Neoplasm of uncertain behavior of skin: Secondary | ICD-10-CM | POA: Diagnosis not present

## 2021-04-11 DIAGNOSIS — R1032 Left lower quadrant pain: Secondary | ICD-10-CM | POA: Diagnosis not present

## 2021-04-11 DIAGNOSIS — M109 Gout, unspecified: Secondary | ICD-10-CM | POA: Diagnosis not present

## 2021-04-13 DIAGNOSIS — M810 Age-related osteoporosis without current pathological fracture: Secondary | ICD-10-CM | POA: Diagnosis not present

## 2021-04-13 DIAGNOSIS — E785 Hyperlipidemia, unspecified: Secondary | ICD-10-CM | POA: Diagnosis not present

## 2021-04-13 DIAGNOSIS — Z0000 Encounter for general adult medical examination without abnormal findings: Secondary | ICD-10-CM | POA: Diagnosis not present

## 2021-04-13 DIAGNOSIS — E039 Hypothyroidism, unspecified: Secondary | ICD-10-CM | POA: Diagnosis not present

## 2021-04-20 DIAGNOSIS — I700 Atherosclerosis of aorta: Secondary | ICD-10-CM | POA: Diagnosis not present

## 2021-04-20 DIAGNOSIS — Z0000 Encounter for general adult medical examination without abnormal findings: Secondary | ICD-10-CM | POA: Diagnosis not present

## 2021-04-20 DIAGNOSIS — E785 Hyperlipidemia, unspecified: Secondary | ICD-10-CM | POA: Diagnosis not present

## 2021-04-20 DIAGNOSIS — M79676 Pain in unspecified toe(s): Secondary | ICD-10-CM | POA: Diagnosis not present

## 2021-04-20 DIAGNOSIS — E041 Nontoxic single thyroid nodule: Secondary | ICD-10-CM | POA: Diagnosis not present

## 2021-04-20 DIAGNOSIS — D126 Benign neoplasm of colon, unspecified: Secondary | ICD-10-CM | POA: Diagnosis not present

## 2021-04-20 DIAGNOSIS — R911 Solitary pulmonary nodule: Secondary | ICD-10-CM | POA: Diagnosis not present

## 2021-04-20 DIAGNOSIS — Z1389 Encounter for screening for other disorder: Secondary | ICD-10-CM | POA: Diagnosis not present

## 2021-04-20 DIAGNOSIS — M810 Age-related osteoporosis without current pathological fracture: Secondary | ICD-10-CM | POA: Diagnosis not present

## 2021-04-20 DIAGNOSIS — E039 Hypothyroidism, unspecified: Secondary | ICD-10-CM | POA: Diagnosis not present

## 2021-04-20 DIAGNOSIS — Z1331 Encounter for screening for depression: Secondary | ICD-10-CM | POA: Diagnosis not present

## 2021-04-24 ENCOUNTER — Ambulatory Visit (INDEPENDENT_AMBULATORY_CARE_PROVIDER_SITE_OTHER): Payer: Medicare Other | Admitting: Podiatry

## 2021-04-24 ENCOUNTER — Ambulatory Visit (INDEPENDENT_AMBULATORY_CARE_PROVIDER_SITE_OTHER): Payer: Medicare Other

## 2021-04-24 ENCOUNTER — Other Ambulatory Visit: Payer: Self-pay

## 2021-04-24 DIAGNOSIS — M79675 Pain in left toe(s): Secondary | ICD-10-CM

## 2021-04-24 NOTE — Progress Notes (Signed)
   HPI: 75 y.o. female presenting today as a new patient for evaluation of left great toe pain that began on 04/08/2021.  Patient states that she drove 5 hours to [place] and when she stepped out of the car she noticed left great toe pain.  She was seen by her PCP and was given a Depo-Medrol injection.  Patient states that over the last week the pain has resolved and completely gone away.  She was debating whether or not to cancel her appointment but she follows up for further treatment and evaluation  Past Medical History:  Diagnosis Date   Arthritis    GERD (gastroesophageal reflux disease)    Heart murmur    Hyperlipidemia    Internal hemorrhoid    Schatzki's ring    Thyroid disease      Physical Exam: General: The patient is alert and oriented x3 in no acute distress.  Dermatology: Skin is warm, dry and supple bilateral lower extremities. Negative for open lesions or macerations.  Vascular: Palpable pedal pulses bilaterally. No edema or erythema noted. Capillary refill within normal limits.  Neurological: Epicritic and protective threshold grossly intact bilaterally.   Musculoskeletal Exam: Range of motion within normal limits to all pedal and ankle joints bilateral. Muscle strength 5/5 in all groups bilateral.  Negative for any significant pain on palpation throughout the left toe  Radiographic Exam:  Normal osseous mineralization.  There is some degenerative changes with periarticular spurring noted to the first MTPJ of the left foot.  No fractures identified.  Clinically the first MTPJ is asymptomatic  Assessment: 1.  Left great toe pain; resolved   Plan of Care:  1. Patient evaluated. X-Rays reviewed.  2.  Recommend good supportive shoes and sneakers 3.  Recommend OTC anti-inflammatory as needed 4.  Return to clinic as needed      Edrick Kins, DPM [facility]  Dr. Edrick Kins, DPM    [address], [place] [address]                Office [phone]  Fax [phone]

## 2021-04-25 ENCOUNTER — Other Ambulatory Visit: Payer: Self-pay | Admitting: Endocrinology

## 2021-04-25 DIAGNOSIS — R911 Solitary pulmonary nodule: Secondary | ICD-10-CM

## 2021-04-27 DIAGNOSIS — M545 Low back pain, unspecified: Secondary | ICD-10-CM | POA: Diagnosis not present

## 2021-05-05 DIAGNOSIS — M545 Low back pain, unspecified: Secondary | ICD-10-CM | POA: Diagnosis not present

## 2021-05-13 ENCOUNTER — Ambulatory Visit
Admission: RE | Admit: 2021-05-13 | Discharge: 2021-05-13 | Disposition: A | Discharge status: Home / Self Care | Payer: Medicare Other | Source: Ambulatory Visit | Attending: Endocrinology | Admitting: Endocrinology

## 2021-05-13 ENCOUNTER — Other Ambulatory Visit: Payer: Self-pay

## 2021-05-13 DIAGNOSIS — J439 Emphysema, unspecified: Secondary | ICD-10-CM | POA: Diagnosis not present

## 2021-05-13 DIAGNOSIS — R911 Solitary pulmonary nodule: Secondary | ICD-10-CM | POA: Diagnosis not present

## 2021-05-13 DIAGNOSIS — I700 Atherosclerosis of aorta: Secondary | ICD-10-CM | POA: Diagnosis not present

## 2021-05-24 ENCOUNTER — Other Ambulatory Visit: Payer: Self-pay

## 2021-05-24 ENCOUNTER — Emergency Department (HOSPITAL_BASED_OUTPATIENT_CLINIC_OR_DEPARTMENT_OTHER)
Admission: EM | Admit: 2021-05-24 | Discharge: 2021-05-24 | Disposition: A | Discharge status: Home / Self Care | Payer: Medicare Other | Attending: Emergency Medicine | Admitting: Emergency Medicine

## 2021-05-24 ENCOUNTER — Emergency Department (HOSPITAL_BASED_OUTPATIENT_CLINIC_OR_DEPARTMENT_OTHER): Payer: Medicare Other

## 2021-05-24 ENCOUNTER — Encounter (HOSPITAL_BASED_OUTPATIENT_CLINIC_OR_DEPARTMENT_OTHER): Payer: Self-pay | Admitting: Emergency Medicine

## 2021-05-24 DIAGNOSIS — R590 Localized enlarged lymph nodes: Secondary | ICD-10-CM | POA: Diagnosis not present

## 2021-05-24 DIAGNOSIS — R110 Nausea: Secondary | ICD-10-CM | POA: Diagnosis not present

## 2021-05-24 DIAGNOSIS — Z7982 Long term (current) use of aspirin: Secondary | ICD-10-CM | POA: Insufficient documentation

## 2021-05-24 DIAGNOSIS — R1032 Left lower quadrant pain: Secondary | ICD-10-CM | POA: Diagnosis not present

## 2021-05-24 DIAGNOSIS — R161 Splenomegaly, not elsewhere classified: Secondary | ICD-10-CM | POA: Diagnosis not present

## 2021-05-24 DIAGNOSIS — I700 Atherosclerosis of aorta: Secondary | ICD-10-CM | POA: Diagnosis not present

## 2021-05-24 DIAGNOSIS — K219 Gastro-esophageal reflux disease without esophagitis: Secondary | ICD-10-CM | POA: Diagnosis not present

## 2021-05-24 DIAGNOSIS — K5730 Diverticulosis of large intestine without perforation or abscess without bleeding: Secondary | ICD-10-CM | POA: Diagnosis not present

## 2021-05-24 LAB — COMPREHENSIVE METABOLIC PANEL
ALT: 17 U/L (ref 0–44)
AST: 21 U/L (ref 15–41)
Albumin: 4.2 g/dL (ref 3.5–5.0)
Alkaline Phosphatase: 56 U/L (ref 38–126)
Anion gap: 9 (ref 5–15)
BUN: 22 mg/dL (ref 8–23)
CO2: 26 mmol/L (ref 22–32)
Calcium: 9 mg/dL (ref 8.9–10.3)
Chloride: 102 mmol/L (ref 98–111)
Creatinine, Ser: 0.79 mg/dL (ref 0.44–1.00)
GFR, Estimated: >60 mL/min (ref >60)
Glucose, Bld: 85 mg/dL (ref 70–99)
Potassium: 3.9 mmol/L (ref 3.5–5.1)
Sodium: 137 mmol/L (ref 135–145)
Total Bilirubin: 0.5 mg/dL (ref 0.3–1.2)
Total Protein: 7.1 g/dL (ref 6.5–8.1)

## 2021-05-24 LAB — CBC WITH DIFFERENTIAL/PLATELET
Abs Immature Granulocytes: 0.03 10*3/uL (ref 0.00–0.07)
Basophils Absolute: 0 10*3/uL (ref 0.0–0.1)
Basophils Relative: 0 %
Eosinophils Absolute: 0.1 10*3/uL (ref 0.0–0.5)
Eosinophils Relative: 1 %
HCT: 43.3 % (ref 36.0–46.0)
Hemoglobin: 13.9 g/dL (ref 12.0–15.0)
Immature Granulocytes: 0 %
Lymphocytes Relative: 14 %
Lymphs Abs: 1.6 10*3/uL (ref 0.7–4.0)
MCH: 29.1 pg (ref 26.0–34.0)
MCHC: 32.1 g/dL (ref 30.0–36.0)
MCV: 90.6 fL (ref 80.0–100.0)
Monocytes Absolute: 1.1 10*3/uL — ABNORMAL HIGH (ref 0.1–1.0)
Monocytes Relative: 10 %
Neutro Abs: 8.1 10*3/uL — ABNORMAL HIGH (ref 1.7–7.7)
Neutrophils Relative %: 75 %
Platelets: 370 10*3/uL (ref 150–400)
RBC: 4.78 MIL/uL (ref 3.87–5.11)
RDW: 13.1 % (ref 11.5–15.5)
WBC: 10.9 10*3/uL — ABNORMAL HIGH (ref 4.0–10.5)
nRBC: 0 % (ref 0.0–0.2)

## 2021-05-24 LAB — URINALYSIS, ROUTINE W REFLEX MICROSCOPIC
Bilirubin Urine: NEGATIVE
Glucose, UA: NEGATIVE mg/dL
Hgb urine dipstick: NEGATIVE
Ketones, ur: NEGATIVE mg/dL
Nitrite: NEGATIVE
Protein, ur: NEGATIVE mg/dL
Specific Gravity, Urine: 1.025 (ref 1.005–1.030)
pH: 5.5 (ref 5.0–8.0)

## 2021-05-24 LAB — LIPASE, BLOOD: Lipase: 28 U/L (ref 11–51)

## 2021-05-24 LAB — URINALYSIS, MICROSCOPIC (REFLEX)

## 2021-05-24 MED ORDER — IOHEXOL 300 MG/ML  SOLN
100.0000 mL | Freq: Once | INTRAMUSCULAR | Status: AC | PRN
  Administered 2021-05-24: 100 mL via INTRAVENOUS

## 2021-05-24 NOTE — ED Notes (Signed)
ED Provider at bedside. 

## 2021-05-24 NOTE — ED Notes (Signed)
Pt discharged to home. Discharge instructions have been discussed with patient and/or family members. Pt verbally acknowledges understanding d/c instructions, and endorses comprehension to checkout at registration before leaving.  °

## 2021-05-24 NOTE — ED Triage Notes (Signed)
Pt reports LLQ abdominal pain that started yesterday. PT reports nausea. Denies urinary symptoms.

## 2021-05-24 NOTE — ED Notes (Signed)
Spoke with lab to add on urine culture 

## 2021-05-24 NOTE — ED Provider Notes (Signed)
[facility] EMERGENCY DEPARTMENT Provider Note   CSN: [id number] Arrival date & time: 05/24/21  1038     History Chief Complaint  Patient presents with   Abdominal Pain    Margaret Graham is a 75 y.o. female.  Margaret Graham is with severe left lower quadrant abdominal pain.  It started yesterday but was fairly minimal.  She states that she was able to ignore the pain.  Today after walking, she developed severe pain.  It was so severe that she felt she could not drive to her daughter-in-law's house.  She states that she currently feels better, but her daughter-in-law convinced her to have an evaluation.  She has no history of renal colic or diverticulitis.  She does have a history of pyelonephritis.  The history is provided by the patient.  Abdominal Pain Pain location:  LLQ Pain quality: not stabbing   Pain radiates to:  Does not radiate Pain severity:  Severe Onset quality:  Sudden Duration:  1 day Timing:  Constant Progression:  Waxing and waning Chronicity:  New Context: not sick contacts, not suspicious food intake and not trauma   Relieved by:  Nothing Worsened by:  Nothing Ineffective treatments: aspirin. Associated symptoms: nausea   Associated symptoms: no chest pain, no chills, no constipation, no cough, no diarrhea, no dysuria, no fever, no hematuria, no shortness of breath, no sore throat and no vomiting   Risk factors: has not had multiple surgeries       Past Medical History:  Diagnosis Date   Arthritis    GERD (gastroesophageal reflux disease)    Heart murmur    Hyperlipidemia    Internal hemorrhoid    Schatzki's ring    Thyroid disease     Patient Active Problem List   Diagnosis Date Noted   Osteopenia 01/23/2016   Dyslipidemia 01/18/2016   History of esophageal stricture 01/18/2016   Postoperative hypothyroidism 01/18/2016   Esophageal reflux 01/18/2016   Snoring 01/18/2016    Past Surgical History:  Procedure  Laterality Date   ABDOMINAL HYSTERECTOMY     BREAST SURGERY     for dilated duct     OB History   No obstetric history on file.     Family History  Problem Relation Age of Onset   Arthritis Mother    Arthritis Father    Hyperlipidemia Father    Diabetes Son    Asthma Son    Heart disease Paternal Uncle    Colon cancer Neg Hx    Esophageal cancer Neg Hx    Rectal cancer Neg Hx     Social History   Tobacco Use   Smoking status: Never   Smokeless tobacco: Never  Vaping Use   Vaping Use: Never used  Substance Use Topics   Alcohol use: Yes    Alcohol/week: 0.0 standard drinks   Drug use: No    Home Medications Prior to Admission medications   Medication Sig Start Date End Date Taking? Authorizing Provider  alendronate (FOSAMAX) 70 MG tablet Take 70 mg by mouth once a week. Take with a full glass of water on an empty stomach.    [provider]  aspirin 81 MG tablet Take 81 mg by mouth daily.    [provider]  azithromycin (ZITHROMAX) 250 MG tablet azithromycin 250 mg tablet  TAKE 2 TABLETS BY MOUTH ON DAY 1, AND THEN TAKE 1 TABLET BY MOUTH ONCE A DAY ON DAY 2 THROUGH DAY 5  [provider]  Calcium Carb-Cholecalciferol (CALCIUM 600 + D PO) Take by mouth.    [provider]  cefUROXime (CEFTIN) 500 MG tablet cefuroxime axetil 500 mg tablet  TAKE 1 TABLET (500 MG TOTAL) BY MOUTH 2 (TWO) TIMES DAILY WITH A MEAL FOR 14 DAYS.    [provider]  chlorpheniramine-HYDROcodone (TUSSIONEX) 10-8 MG/5ML SUER hydrocodone 10 mg-chlorpheniramine 8 mg/5 mL oral susp extend.rel 12hr  5 ML AS NEEDED ORALLY EVERY 12 HRS PRN 7 DAYS    [provider]  ezetimibe (ZETIA) 10 MG tablet Take 10 mg by mouth daily.    [provider]  famotidine (PEPCID) 40 MG tablet Take 1 tablet (40 mg total) by mouth daily. 11/10/20   Doran Stabler, MD  fluticasone (FLONASE) 50 MCG/ACT nasal spray Place into both nostrils daily.     [provider]  levothyroxine (SYNTHROID, LEVOTHROID) 100 MCG tablet Take 1 tablet (100 mcg total) by mouth every morning. 08/05/16   Copland, Gay Filler, MD  Multiple Vitamin (MULTIVITAMIN) capsule Take 1 capsule by mouth daily.    [provider]  simvastatin (ZOCOR) 20 MG tablet Take 1 tablet (20 mg total) by mouth daily. 10/24/16   Copland, Gay Filler, MD    Allergies    Losartan, Other, and Declomycin [demeclocycline]  Review of Systems   Review of Systems  Constitutional:  Negative for chills and fever.  HENT:  Negative for ear pain and sore throat.   Eyes:  Negative for pain and visual disturbance.  Respiratory:  Negative for cough and shortness of breath.   Cardiovascular:  Negative for chest pain and palpitations.  Gastrointestinal:  Positive for abdominal pain and nausea. Negative for constipation, diarrhea and vomiting.  Genitourinary:  Negative for dysuria and hematuria.  Musculoskeletal:  Negative for arthralgias and back pain.  Skin:  Negative for color change and rash.  Neurological:  Negative for seizures and syncope.  All other systems reviewed and are negative.  Physical Exam Updated Vital Signs BP (!) 151/81 (BP Location: Right Arm)   Pulse 70   Temp 98.5 F (36.9 C) (Oral)   Resp 17   SpO2 100%   Physical Exam Vitals and nursing note reviewed.  Constitutional:      Appearance: She is well-developed.  HENT:     Head: Normocephalic and atraumatic.  Cardiovascular:     Rate and Rhythm: Normal rate and regular rhythm.     Heart sounds: Normal heart sounds.  Pulmonary:     Effort: Pulmonary effort is normal. No tachypnea.     Breath sounds: Normal breath sounds.  Abdominal:     Palpations: Abdomen is soft.     Tenderness: There is no abdominal tenderness.  Musculoskeletal:     Right lower leg: No edema.     Left lower leg: No edema.  Skin:    General: Skin is warm and dry.  Neurological:     General: No focal deficit present.      Mental Status: She is alert and oriented to person, place, and time.  Psychiatric:        Mood and Affect: Mood normal.        Behavior: Behavior normal.    ED Results / Procedures / Treatments   Labs (all labs ordered are listed, but only abnormal results are displayed) Labs Reviewed  URINALYSIS, ROUTINE W REFLEX MICROSCOPIC - Abnormal; Notable for the following components:      Result Value   Leukocytes,Ua SMALL (*)  All other components within normal limits  CBC WITH DIFFERENTIAL/PLATELET - Abnormal; Notable for the following components:   WBC 10.9 (*)    Neutro Abs 8.1 (*)    Monocytes Absolute 1.1 (*)    All other components within normal limits  URINALYSIS, MICROSCOPIC (REFLEX) - Abnormal; Notable for the following components:   Bacteria, UA FEW (*)    All other components within normal limits  URINE CULTURE  LIPASE, BLOOD  COMPREHENSIVE METABOLIC PANEL    EKG None  Radiology CT Abdomen Pelvis W Contrast  Result Date: 05/24/2021 CLINICAL DATA:  Diverticulitis suspected with LEFT lower quadrant abdominal pain that began yesterday in a 75 year old female. EXAM: CT ABDOMEN AND PELVIS WITH CONTRAST TECHNIQUE: Multidetector CT imaging of the abdomen and pelvis was performed using the standard protocol following bolus administration of intravenous contrast. CONTRAST:  158m OMNIPAQUE IOHEXOL 300 MG/ML  SOLN COMPARISON:  Chest CT of May 13, 2021. FINDINGS: Lower chest: Stable appearance of tubular nodularity in the RIGHT middle lobe potentially material within a bronchus diminished in size compared to evaluation of November of 2017 no consolidation or evidence of pleural effusion. Hepatobiliary: No focal, suspicious hepatic lesion. No biliary duct dilation. Pancreas: Normal, without mass, inflammation or ductal dilatation. Spleen: Spleen normal size and contour. Adrenals/Urinary Tract: Adrenal glands are normal. Symmetric renal enhancement. No hydronephrosis. No perinephric  stranding. No ureteral calculi are visualized. The urinary bladder with smooth contours. Stomach/Bowel: No acute gastrointestinal process. Normal appendix. Stool throughout the proximal colon decompressed distally with signs of colonic diverticular disease in the descending and sigmoid colon. No signs of free air. No significant pericolonic stranding. Vascular/Lymphatic: Scattered mesenteric lymph nodes with stable appearance dating back to 2019 largest approximately 8 mm in the LEFT hemiabdomen in the jejunal mesentery. Mild associated ease anus of the small bowel mesentery. No retroperitoneal adenopathy or upper abdominal lymphadenopathy. Absence of splenic enlargement. Calcified and noncalcified atherosclerotic changes in the abdominal aorta. Smooth contour of the abdominal aorta. No pelvic sidewall lymphadenopathy. Reproductive: Post hysterectomy without pelvic adenopathy or adnexal mass. Other: No ascites. Musculoskeletal: No acute bone finding. No destructive bone process. IMPRESSION: 1. Diverticular disease without diverticulitis. 2. Scattered prominent mesenteric lymph nodes unchanged dating back to 2019, potentially reactive. 3. Aortic atherosclerosis. Aortic Atherosclerosis (ICD10-I70.0). Electronically Signed   By: GZetta BillsM.D.   On: 05/24/2021 13:26    Procedures Procedures   Medications Ordered in ED Medications - No data to display  ED Course  I have reviewed the triage vital signs and the nursing notes.  Pertinent labs & imaging results that were available during my care of the patient were reviewed by me and considered in my medical decision making (see chart for details).    MDM Rules/Calculators/A&P                           MAdelae Wegmanpresents with left lower quadrant abdominal pain that was quite severe but has since resolved.  ED work-up was conducted to evaluate for sources of her pain including diverticulitis, intestinal obstruction, mass, urologic  abnormality such as renal colic, pyelonephritis.  ED work-up largely within normal limits, but urine was somewhat equivocal.  I did add on a urine culture.  She was counseled to return if symptoms come back. Final Clinical Impression(s) / ED Diagnoses Final diagnoses:  Left lower quadrant abdominal pain    Rx / DC Orders ED Discharge Orders     None  Arnaldo Natal, MD 05/24/21 1355

## 2021-05-26 LAB — URINE CULTURE

## 2021-06-08 ENCOUNTER — Other Ambulatory Visit: Payer: Self-pay | Admitting: Gastroenterology

## 2021-06-09 DIAGNOSIS — R1032 Left lower quadrant pain: Secondary | ICD-10-CM | POA: Diagnosis not present

## 2021-06-30 DIAGNOSIS — H5203 Hypermetropia, bilateral: Secondary | ICD-10-CM | POA: Diagnosis not present

## 2021-06-30 DIAGNOSIS — H2513 Age-related nuclear cataract, bilateral: Secondary | ICD-10-CM | POA: Diagnosis not present

## 2021-07-17 DIAGNOSIS — Z23 Encounter for immunization: Secondary | ICD-10-CM | POA: Diagnosis not present

## 2021-08-02 ENCOUNTER — Other Ambulatory Visit: Payer: Self-pay | Admitting: Gastroenterology

## 2021-08-07 DIAGNOSIS — Z23 Encounter for immunization: Secondary | ICD-10-CM | POA: Diagnosis not present

## 2021-09-22 DIAGNOSIS — Z1231 Encounter for screening mammogram for malignant neoplasm of breast: Secondary | ICD-10-CM | POA: Diagnosis not present

## 2021-10-10 DIAGNOSIS — R030 Elevated blood-pressure reading, without diagnosis of hypertension: Secondary | ICD-10-CM | POA: Diagnosis not present

## 2021-10-10 DIAGNOSIS — G8929 Other chronic pain: Secondary | ICD-10-CM | POA: Diagnosis not present

## 2021-10-10 DIAGNOSIS — M546 Pain in thoracic spine: Secondary | ICD-10-CM | POA: Diagnosis not present

## 2021-10-10 DIAGNOSIS — M542 Cervicalgia: Secondary | ICD-10-CM | POA: Diagnosis not present

## 2021-10-10 DIAGNOSIS — Z6827 Body mass index (BMI) 27.0-27.9, adult: Secondary | ICD-10-CM | POA: Diagnosis not present

## 2021-10-10 DIAGNOSIS — M4316 Spondylolisthesis, lumbar region: Secondary | ICD-10-CM | POA: Diagnosis not present

## 2021-10-24 ENCOUNTER — Encounter (HOSPITAL_BASED_OUTPATIENT_CLINIC_OR_DEPARTMENT_OTHER): Payer: Self-pay | Admitting: *Deleted

## 2021-10-24 ENCOUNTER — Emergency Department (HOSPITAL_BASED_OUTPATIENT_CLINIC_OR_DEPARTMENT_OTHER)
Admission: EM | Admit: 2021-10-24 | Discharge: 2021-10-24 | Disposition: A | Discharge status: Home / Self Care | Payer: Medicare Other | Attending: Emergency Medicine | Admitting: Emergency Medicine

## 2021-10-24 ENCOUNTER — Emergency Department (HOSPITAL_BASED_OUTPATIENT_CLINIC_OR_DEPARTMENT_OTHER): Payer: Medicare Other

## 2021-10-24 ENCOUNTER — Other Ambulatory Visit: Payer: Self-pay

## 2021-10-24 DIAGNOSIS — R051 Acute cough: Secondary | ICD-10-CM

## 2021-10-24 DIAGNOSIS — U071 COVID-19: Secondary | ICD-10-CM

## 2021-10-24 DIAGNOSIS — R059 Cough, unspecified: Secondary | ICD-10-CM | POA: Diagnosis not present

## 2021-10-24 LAB — RESP PANEL BY RT-PCR (FLU A&B, COVID) ARPGX2
Influenza A by PCR: POSITIVE — AB
Influenza B by PCR: NEGATIVE
SARS Coronavirus 2 by RT PCR: NEGATIVE

## 2021-10-24 MED ORDER — HYDROCODONE BIT-HOMATROP MBR 5-1.5 MG/5ML PO SOLN: 5.0000 mL | Freq: Four times a day (QID) | ORAL | 0 refills | Status: DC | PRN

## 2021-10-24 NOTE — Discharge Instructions (Addendum)
Follow-up with your primary care doctor sometime the next couple days.  Take the cough medication as needed for bad coughing.  Note this can make adjustments limiting her driving.  Come back to ER if you develop difficulty breathing, vomiting or other new concerning symptom.  There was an incidental finding on your chest x-ray and the radiologist stated "Partially visualized bone lesion in the proximal right humerus with apparent chondroid matrix, appearing increased since 2017. Dedicated right humerus radiographs suggested on a short term outpatient basis."  Please ask your primary care doctor to get an x-ray of your right humerus.

## 2021-10-24 NOTE — ED Notes (Signed)
X-ray at bedside

## 2021-10-24 NOTE — ED Triage Notes (Signed)
Cough for a week. Her husband has flu.

## 2021-10-24 NOTE — ED Provider Notes (Signed)
[facility] EMERGENCY DEPARTMENT Provider Note   CSN: [id number] Arrival date & time: 10/24/21  1215     History Chief Complaint  Patient presents with   Cough    Margaret Graham is a 75 y.o. female.  Presenting to the emergency room with concern for cough.  States that symptoms have been ongoing for approximately 1 week.  Cough is mostly nonproductive.  Nonbloody.  States that she has also had significant generalized malaise and fatigue.  Poor energy.  Still eating and drinking.  HPI     Past Medical History:  Diagnosis Date   Arthritis    GERD (gastroesophageal reflux disease)    Heart murmur    Hyperlipidemia    Internal hemorrhoid    Schatzki's ring    Thyroid disease     Patient Active Problem List   Diagnosis Date Noted   Osteopenia 01/23/2016   Dyslipidemia 01/18/2016   History of esophageal stricture 01/18/2016   Postoperative hypothyroidism 01/18/2016   Esophageal reflux 01/18/2016   Snoring 01/18/2016    Past Surgical History:  Procedure Laterality Date   ABDOMINAL HYSTERECTOMY     BREAST SURGERY     for dilated duct     OB History   No obstetric history on file.     Family History  Problem Relation Age of Onset   Arthritis Mother    Arthritis Father    Hyperlipidemia Father    Diabetes Son    Asthma Son    Heart disease Paternal Uncle    Colon cancer Neg Hx    Esophageal cancer Neg Hx    Rectal cancer Neg Hx     Social History   Tobacco Use   Smoking status: Never   Smokeless tobacco: Never  Vaping Use   Vaping Use: Never used  Substance Use Topics   Alcohol use: Yes    Alcohol/week: 0.0 standard drinks   Drug use: No    Home Medications Prior to Admission medications   Medication Sig Start Date End Date Taking? Authorizing Provider  HYDROcodone bit-homatropine (HYCODAN) 5-1.5 MG/5ML syrup Take 5 mLs by mouth every 6 (six) hours as needed for cough. 10/24/21  Yes Lucrezia Starch, MD  alendronate  (FOSAMAX) 70 MG tablet Take 70 mg by mouth once a week. Take with a full glass of water on an empty stomach.    [provider]  aspirin 81 MG tablet Take 81 mg by mouth daily.    [provider]  azithromycin (ZITHROMAX) 250 MG tablet azithromycin 250 mg tablet  TAKE 2 TABLETS BY MOUTH ON DAY 1, AND THEN TAKE 1 TABLET BY MOUTH ONCE A DAY ON DAY 2 THROUGH DAY 5    [provider]  Calcium Carb-Cholecalciferol (CALCIUM 600 + D PO) Take by mouth.    [provider]  cefUROXime (CEFTIN) 500 MG tablet cefuroxime axetil 500 mg tablet  TAKE 1 TABLET (500 MG TOTAL) BY MOUTH 2 (TWO) TIMES DAILY WITH A MEAL FOR 14 DAYS.    [provider]  chlorpheniramine-HYDROcodone (TUSSIONEX) 10-8 MG/5ML SUER hydrocodone 10 mg-chlorpheniramine 8 mg/5 mL oral susp extend.rel 12hr  5 ML AS NEEDED ORALLY EVERY 12 HRS PRN 7 DAYS    [provider]  ezetimibe (ZETIA) 10 MG tablet Take 10 mg by mouth daily.    [provider]  famotidine (PEPCID) 40 MG tablet Take 1 tablet by mouth once daily 08/02/21   Doran Stabler, MD  fluticasone (FLONASE) 50  MCG/ACT nasal spray Place into both nostrils daily.    [provider]  levothyroxine (SYNTHROID, LEVOTHROID) 100 MCG tablet Take 1 tablet (100 mcg total) by mouth every morning. 08/05/16   Copland, Gay Filler, MD  Multiple Vitamin (MULTIVITAMIN) capsule Take 1 capsule by mouth daily.    [provider]  simvastatin (ZOCOR) 20 MG tablet Take 1 tablet (20 mg total) by mouth daily. 10/24/16   Copland, Gay Filler, MD    Allergies    Losartan and Declomycin [demeclocycline]  Review of Systems   Review of Systems  Constitutional:  Positive for chills and fatigue. Negative for fever.  HENT:  Negative for ear pain and sore throat.   Eyes:  Negative for pain and visual disturbance.  Respiratory:  Positive for cough. Negative for shortness of breath.   Cardiovascular:  Negative for chest pain and  palpitations.  Gastrointestinal:  Negative for abdominal pain and vomiting.  Genitourinary:  Negative for dysuria and hematuria.  Musculoskeletal:  Negative for arthralgias and back pain.  Skin:  Negative for color change and rash.  Neurological:  Negative for seizures and syncope.  All other systems reviewed and are negative.  Physical Exam Updated Vital Signs BP (!) 132/56 (BP Location: Right Arm)    Pulse 92    Temp 98.4 F (36.9 C) (Oral)    Resp 20    Ht 5\' 5"  (1.651 m)    Wt 72.6 kg    SpO2 97%    BMI 26.63 kg/m   Physical Exam Vitals and nursing note reviewed.  Constitutional:      General: She is not in acute distress.    Appearance: She is well-developed.  HENT:     Head: Normocephalic and atraumatic.  Eyes:     Conjunctiva/sclera: Conjunctivae normal.  Cardiovascular:     Rate and Rhythm: Normal rate and regular rhythm.     Heart sounds: No murmur heard. Pulmonary:     Effort: Pulmonary effort is normal. No respiratory distress.     Breath sounds: Normal breath sounds.  Abdominal:     Palpations: Abdomen is soft.     Tenderness: There is no abdominal tenderness.  Musculoskeletal:        General: No swelling.     Cervical back: Neck supple.  Skin:    General: Skin is warm and dry.     Capillary Refill: Capillary refill takes less than 2 seconds.  Neurological:     General: No focal deficit present.     Mental Status: She is alert.  Psychiatric:        Mood and Affect: Mood normal.    ED Results / Procedures / Treatments   Labs (all labs ordered are listed, but only abnormal results are displayed) Labs Reviewed  RESP PANEL BY RT-PCR (FLU A&B, COVID) ARPGX2 - Abnormal; Notable for the following components:      Result Value   Influenza A by PCR POSITIVE (*)    All other components within normal limits    EKG None  Radiology DG Chest Portable 1 View  Result Date: 10/24/2021 CLINICAL DATA:  Cough for 1 week EXAM: PORTABLE CHEST 1 VIEW COMPARISON:   09/17/2016 chest radiograph. FINDINGS: Stable cardiomediastinal silhouette with normal heart size. No pneumothorax. No pleural effusion. Lungs appear clear, with no acute consolidative airspace disease and no pulmonary edema. Partially visualized bone lesion in the proximal right humerus with apparent chondroid matrix, appearing increased. IMPRESSION: 1. No active cardiopulmonary disease. 2. Partially visualized bone  lesion in the proximal right humerus with apparent chondroid matrix, appearing increased since 2017. Dedicated right humerus radiographs suggested on a short term outpatient basis. Electronically Signed   By: Ilona Sorrel M.D.   On: 10/24/2021 13:13    Procedures Procedures   Medications Ordered in ED Medications - No data to display  ED Course  I have reviewed the triage vital signs and the nursing notes.  Pertinent labs & imaging results that were available during my care of the patient were reviewed by me and considered in my medical decision making (see chart for details).    MDM Rules/Calculators/A&P                         75 year old lady presents to ER with concern for cough, malaise.  On exam she is well-appearing in no acute distress.  Her vital signs are within normal limits.  Chest x-ray negative for acute pathology.  Flu testing positive.  Suspect this is culprit for her symptoms today.  Given her well appearance, tolerating p.o. without difficulty, no hypoxia or tachypnea, believe she is appropriate for discharge and outpatient management.  Will provide short Rx for cough suppressant as patient reports this has been helpful previously for managing her symptoms.  Incidental finding on chest x-ray of bone lesion in right humerus.  Discussed with patient and recommended follow-up with primary care and repeat outpatient x-rays per radiology report.    After the discussed management above, the patient was determined to be safe for discharge.  The patient was in agreement  with this plan and all questions regarding their care were answered.  ED return precautions were discussed and the patient will return to the ED with any significant worsening of condition.     Final Clinical Impression(s) / ED Diagnoses Final diagnoses:  Acute cough  COVID-19    Rx / DC Orders ED Discharge Orders          Ordered    HYDROcodone bit-homatropine (HYCODAN) 5-1.5 MG/5ML syrup  Every 6 hours PRN        10/24/21 1406             Lucrezia Starch, MD 10/24/21 1446

## 2021-10-24 NOTE — ED Notes (Signed)
D/c paperwork reviewed with pt, including prescription and f/u care. Pt with no questions or concerns at time of d/c. Ambulatory to ED exit without assistance.

## 2021-11-01 DIAGNOSIS — J101 Influenza due to other identified influenza virus with other respiratory manifestations: Secondary | ICD-10-CM | POA: Diagnosis not present

## 2021-11-01 DIAGNOSIS — Z1152 Encounter for screening for COVID-19: Secondary | ICD-10-CM | POA: Diagnosis not present

## 2021-11-01 DIAGNOSIS — R051 Acute cough: Secondary | ICD-10-CM | POA: Diagnosis not present

## 2021-11-01 DIAGNOSIS — R5383 Other fatigue: Secondary | ICD-10-CM | POA: Diagnosis not present

## 2021-11-20 ENCOUNTER — Encounter (HOSPITAL_COMMUNITY): Payer: Self-pay | Admitting: Radiology

## 2021-11-22 DIAGNOSIS — M542 Cervicalgia: Secondary | ICD-10-CM | POA: Diagnosis not present

## 2021-11-22 DIAGNOSIS — M546 Pain in thoracic spine: Secondary | ICD-10-CM | POA: Diagnosis not present

## 2021-11-22 DIAGNOSIS — M2578 Osteophyte, vertebrae: Secondary | ICD-10-CM | POA: Diagnosis not present

## 2021-11-27 ENCOUNTER — Ambulatory Visit: Payer: Medicare Other

## 2021-11-27 ENCOUNTER — Ambulatory Visit (INDEPENDENT_AMBULATORY_CARE_PROVIDER_SITE_OTHER): Payer: Medicare Other | Admitting: Orthopaedic Surgery

## 2021-11-27 DIAGNOSIS — M25511 Pain in right shoulder: Secondary | ICD-10-CM

## 2021-11-27 DIAGNOSIS — M899 Disorder of bone, unspecified: Secondary | ICD-10-CM | POA: Diagnosis not present

## 2021-11-27 NOTE — Progress Notes (Signed)
The patient is a 76 year old female sent from Dr. Pasty Arch with [facility] to evaluate a lesion in her right humeral head that was seen on previous imaging studies.  She does report a chronic rotator cuff issue with the right shoulder but really no significant pain at all.  It does not wake her up at night.  I was able to look back at imaging studies from July of last year and even back to 2017.  There was evidence of this lesion in her proximal humerus then as well.  On my exam she has excellent range of motion of right shoulder with no pain at all.  She has good strength in the shoulder and just a little bit of pain at the [facility] joint area.  Plain films of her right shoulder today show a lesion in the proximal humerus that looks more like a benign lesion such as an enchondroma on my read.  There is no lytic aspect of this at all.  I was able to see a CT scan from July of last year which was upper chest but she can see the humeral head and that showed this lesion as well.  It is unchanged.  I saw the lesion as far back as films from 2017.  I went over the x-ray findings with her and showed her the x-rays.  I would like to repeat 3 views of her right shoulder in 6 months from now.  If things worsen before then with the shoulder she will let us know because we would want to obtain an MRI at that standpoint.  Again, based on clinical findings and the natural history of this being on x-rays over the last 6 years, this appears to be benign.  All questions and concerns were answered and addressed.

## 2021-11-28 DIAGNOSIS — Z6827 Body mass index (BMI) 27.0-27.9, adult: Secondary | ICD-10-CM | POA: Diagnosis not present

## 2021-11-28 DIAGNOSIS — R030 Elevated blood-pressure reading, without diagnosis of hypertension: Secondary | ICD-10-CM | POA: Diagnosis not present

## 2021-11-28 DIAGNOSIS — M542 Cervicalgia: Secondary | ICD-10-CM | POA: Diagnosis not present

## 2021-12-14 ENCOUNTER — Other Ambulatory Visit: Payer: Self-pay | Admitting: Gastroenterology

## 2021-12-19 ENCOUNTER — Other Ambulatory Visit: Payer: Self-pay | Admitting: Gastroenterology

## 2021-12-26 DIAGNOSIS — M4316 Spondylolisthesis, lumbar region: Secondary | ICD-10-CM | POA: Diagnosis not present

## 2022-01-02 DIAGNOSIS — M4316 Spondylolisthesis, lumbar region: Secondary | ICD-10-CM | POA: Diagnosis not present

## 2022-01-04 DIAGNOSIS — M4316 Spondylolisthesis, lumbar region: Secondary | ICD-10-CM | POA: Diagnosis not present

## 2022-01-09 DIAGNOSIS — M4316 Spondylolisthesis, lumbar region: Secondary | ICD-10-CM | POA: Diagnosis not present

## 2022-01-11 DIAGNOSIS — M4316 Spondylolisthesis, lumbar region: Secondary | ICD-10-CM | POA: Diagnosis not present

## 2022-01-16 DIAGNOSIS — M4316 Spondylolisthesis, lumbar region: Secondary | ICD-10-CM | POA: Diagnosis not present

## 2022-01-23 DIAGNOSIS — M4316 Spondylolisthesis, lumbar region: Secondary | ICD-10-CM | POA: Diagnosis not present

## 2022-01-25 DIAGNOSIS — M4316 Spondylolisthesis, lumbar region: Secondary | ICD-10-CM | POA: Diagnosis not present

## 2022-01-30 DIAGNOSIS — M4316 Spondylolisthesis, lumbar region: Secondary | ICD-10-CM | POA: Diagnosis not present

## 2022-02-01 DIAGNOSIS — M4316 Spondylolisthesis, lumbar region: Secondary | ICD-10-CM | POA: Diagnosis not present

## 2022-02-22 DIAGNOSIS — M7062 Trochanteric bursitis, left hip: Secondary | ICD-10-CM | POA: Diagnosis not present

## 2022-02-22 DIAGNOSIS — M7061 Trochanteric bursitis, right hip: Secondary | ICD-10-CM | POA: Diagnosis not present

## 2022-03-19 ENCOUNTER — Other Ambulatory Visit: Payer: Self-pay

## 2022-03-19 MED ORDER — FAMOTIDINE 40 MG PO TABS: 40.0000 mg | ORAL_TABLET | Freq: Every day | ORAL | 0 refills | Status: DC

## 2022-04-02 DIAGNOSIS — L720 Epidermal cyst: Secondary | ICD-10-CM | POA: Diagnosis not present

## 2022-04-02 DIAGNOSIS — L570 Actinic keratosis: Secondary | ICD-10-CM | POA: Diagnosis not present

## 2022-04-02 DIAGNOSIS — D1801 Hemangioma of skin and subcutaneous tissue: Secondary | ICD-10-CM | POA: Diagnosis not present

## 2022-04-02 DIAGNOSIS — L821 Other seborrheic keratosis: Secondary | ICD-10-CM | POA: Diagnosis not present

## 2022-04-02 DIAGNOSIS — L814 Other melanin hyperpigmentation: Secondary | ICD-10-CM | POA: Diagnosis not present

## 2022-04-03 DIAGNOSIS — Z6826 Body mass index (BMI) 26.0-26.9, adult: Secondary | ICD-10-CM | POA: Diagnosis not present

## 2022-04-03 DIAGNOSIS — M7061 Trochanteric bursitis, right hip: Secondary | ICD-10-CM | POA: Diagnosis not present

## 2022-04-17 DIAGNOSIS — M810 Age-related osteoporosis without current pathological fracture: Secondary | ICD-10-CM | POA: Diagnosis not present

## 2022-04-17 DIAGNOSIS — Z8262 Family history of osteoporosis: Secondary | ICD-10-CM | POA: Diagnosis not present

## 2022-04-17 DIAGNOSIS — K219 Gastro-esophageal reflux disease without esophagitis: Secondary | ICD-10-CM | POA: Diagnosis not present

## 2022-04-17 DIAGNOSIS — Z79899 Other long term (current) drug therapy: Secondary | ICD-10-CM | POA: Diagnosis not present

## 2022-04-17 DIAGNOSIS — E039 Hypothyroidism, unspecified: Secondary | ICD-10-CM | POA: Diagnosis not present

## 2022-04-17 DIAGNOSIS — Z5181 Encounter for therapeutic drug level monitoring: Secondary | ICD-10-CM | POA: Diagnosis not present

## 2022-04-20 ENCOUNTER — Encounter: Payer: Self-pay | Admitting: Gastroenterology

## 2022-04-20 ENCOUNTER — Ambulatory Visit (INDEPENDENT_AMBULATORY_CARE_PROVIDER_SITE_OTHER): Payer: Medicare Other | Admitting: Gastroenterology

## 2022-04-20 VITALS — BP 116/68 | HR 68 | Ht 64.5 in | Wt 156.0 lb

## 2022-04-20 DIAGNOSIS — K5909 Other constipation: Secondary | ICD-10-CM

## 2022-04-20 DIAGNOSIS — K219 Gastro-esophageal reflux disease without esophagitis: Secondary | ICD-10-CM | POA: Diagnosis not present

## 2022-04-23 DIAGNOSIS — R7989 Other specified abnormal findings of blood chemistry: Secondary | ICD-10-CM | POA: Diagnosis not present

## 2022-04-23 DIAGNOSIS — E785 Hyperlipidemia, unspecified: Secondary | ICD-10-CM | POA: Diagnosis not present

## 2022-04-23 DIAGNOSIS — M810 Age-related osteoporosis without current pathological fracture: Secondary | ICD-10-CM | POA: Diagnosis not present

## 2022-04-23 DIAGNOSIS — E039 Hypothyroidism, unspecified: Secondary | ICD-10-CM | POA: Diagnosis not present

## 2022-04-24 DIAGNOSIS — Z780 Asymptomatic menopausal state: Secondary | ICD-10-CM | POA: Diagnosis not present

## 2022-04-24 DIAGNOSIS — M810 Age-related osteoporosis without current pathological fracture: Secondary | ICD-10-CM | POA: Diagnosis not present

## 2022-04-26 DIAGNOSIS — I700 Atherosclerosis of aorta: Secondary | ICD-10-CM | POA: Diagnosis not present

## 2022-04-26 DIAGNOSIS — R911 Solitary pulmonary nodule: Secondary | ICD-10-CM | POA: Diagnosis not present

## 2022-04-26 DIAGNOSIS — Z0000 Encounter for general adult medical examination without abnormal findings: Secondary | ICD-10-CM | POA: Diagnosis not present

## 2022-04-26 DIAGNOSIS — D126 Benign neoplasm of colon, unspecified: Secondary | ICD-10-CM | POA: Diagnosis not present

## 2022-04-26 DIAGNOSIS — E785 Hyperlipidemia, unspecified: Secondary | ICD-10-CM | POA: Diagnosis not present

## 2022-04-26 DIAGNOSIS — E041 Nontoxic single thyroid nodule: Secondary | ICD-10-CM | POA: Diagnosis not present

## 2022-04-26 DIAGNOSIS — M810 Age-related osteoporosis without current pathological fracture: Secondary | ICD-10-CM | POA: Diagnosis not present

## 2022-04-26 DIAGNOSIS — E039 Hypothyroidism, unspecified: Secondary | ICD-10-CM | POA: Diagnosis not present

## 2022-04-26 DIAGNOSIS — Z1339 Encounter for screening examination for other mental health and behavioral disorders: Secondary | ICD-10-CM | POA: Diagnosis not present

## 2022-04-26 DIAGNOSIS — Z1331 Encounter for screening for depression: Secondary | ICD-10-CM | POA: Diagnosis not present

## 2022-04-26 DIAGNOSIS — R82998 Other abnormal findings in urine: Secondary | ICD-10-CM | POA: Diagnosis not present

## 2022-04-26 DIAGNOSIS — M899 Disorder of bone, unspecified: Secondary | ICD-10-CM | POA: Diagnosis not present

## 2022-04-30 DIAGNOSIS — Z0000 Encounter for general adult medical examination without abnormal findings: Secondary | ICD-10-CM | POA: Diagnosis not present

## 2022-04-30 DIAGNOSIS — Z1212 Encounter for screening for malignant neoplasm of rectum: Secondary | ICD-10-CM | POA: Diagnosis not present

## 2022-05-28 ENCOUNTER — Encounter: Payer: Self-pay | Admitting: Orthopaedic Surgery

## 2022-05-28 ENCOUNTER — Ambulatory Visit (INDEPENDENT_AMBULATORY_CARE_PROVIDER_SITE_OTHER): Payer: Medicare Other

## 2022-05-28 ENCOUNTER — Ambulatory Visit (INDEPENDENT_AMBULATORY_CARE_PROVIDER_SITE_OTHER): Payer: Medicare Other | Admitting: Orthopaedic Surgery

## 2022-05-28 DIAGNOSIS — M25511 Pain in right shoulder: Secondary | ICD-10-CM

## 2022-05-28 DIAGNOSIS — G8929 Other chronic pain: Secondary | ICD-10-CM

## 2022-05-28 NOTE — Progress Notes (Signed)
The patient is a 76 year old female that I am seeing back today just for follow-up x-rays of a right shoulder.  There was an incidental finding of what appeared to be an enchondroma that was benign.  However it is worth repeating x-rays to make sure nothing is changed.  She does have some pain time to time in her right shoulder especially when she sleeps but overall is doing well.  I did talk to her about a possible steroid injection but she has deferred this.  Her right shoulder moves smoothly and fluidly.  X-rays of the right shoulder are seen today and compared to x-rays from 6 months ago.  There is no change in the area that is being seen in the humeral head consistent with a benign process such as an enchondroma.  At this point follow-up can be as needed for her shoulder since there has been no change at all of these x-rays.  If things worsen at all in any way she needs to let us know.

## 2022-06-17 ENCOUNTER — Other Ambulatory Visit: Payer: Self-pay | Admitting: Gastroenterology

## 2022-06-26 DIAGNOSIS — M7061 Trochanteric bursitis, right hip: Secondary | ICD-10-CM | POA: Diagnosis not present

## 2022-06-26 DIAGNOSIS — M4316 Spondylolisthesis, lumbar region: Secondary | ICD-10-CM | POA: Diagnosis not present

## 2022-06-27 DIAGNOSIS — B029 Zoster without complications: Secondary | ICD-10-CM | POA: Diagnosis not present

## 2022-07-09 DIAGNOSIS — R051 Acute cough: Secondary | ICD-10-CM | POA: Diagnosis not present

## 2022-07-09 DIAGNOSIS — J09X2 Influenza due to identified novel influenza A virus with other respiratory manifestations: Secondary | ICD-10-CM | POA: Diagnosis not present

## 2022-07-14 ENCOUNTER — Telehealth: Payer: Self-pay | Admitting: Physician Assistant

## 2022-07-14 NOTE — Telephone Encounter (Signed)
Telephone Call  07/14/22 3:26 pm  Patient called to discuss constipation. Apparently she has had the Flu and been sick at home in bed and has not had a good BM now for 3-4 days.  She has continued to pass very small bit of stool and hears a lot of gurgling in her stomach.  She has been taking MIralax qd x3 days and this morning took another two doses, but still is not having any movement.  Suggested she take another dose of Miralax today and then tomorrow go ahead and take another three- four doses.  Also discussed buying an otc enema to do this evening to help get things moving from below.  If she is still having trouble by tomorrow afternoon she will call back and let me know.   No nausea or vomiting, bloated but no real abdominal pain.  Ellouise Newer, PA-C

## 2022-07-17 NOTE — Telephone Encounter (Signed)
Agreed, and thank you for attending to this while I was out of the office.  - HD

## 2022-07-27 DIAGNOSIS — E785 Hyperlipidemia, unspecified: Secondary | ICD-10-CM | POA: Diagnosis not present

## 2022-08-07 DIAGNOSIS — Z6826 Body mass index (BMI) 26.0-26.9, adult: Secondary | ICD-10-CM | POA: Diagnosis not present

## 2022-08-07 DIAGNOSIS — M7061 Trochanteric bursitis, right hip: Secondary | ICD-10-CM | POA: Diagnosis not present

## 2022-08-16 DIAGNOSIS — J069 Acute upper respiratory infection, unspecified: Secondary | ICD-10-CM | POA: Diagnosis not present

## 2022-08-16 DIAGNOSIS — R058 Other specified cough: Secondary | ICD-10-CM | POA: Diagnosis not present

## 2022-08-16 DIAGNOSIS — R5383 Other fatigue: Secondary | ICD-10-CM | POA: Diagnosis not present

## 2022-08-16 DIAGNOSIS — Z1152 Encounter for screening for COVID-19: Secondary | ICD-10-CM | POA: Diagnosis not present

## 2022-08-22 ENCOUNTER — Encounter: Payer: Self-pay | Admitting: Orthopaedic Surgery

## 2022-08-22 ENCOUNTER — Ambulatory Visit (INDEPENDENT_AMBULATORY_CARE_PROVIDER_SITE_OTHER): Payer: Medicare Other | Admitting: Orthopaedic Surgery

## 2022-08-22 ENCOUNTER — Ambulatory Visit (INDEPENDENT_AMBULATORY_CARE_PROVIDER_SITE_OTHER): Payer: Medicare Other

## 2022-08-22 DIAGNOSIS — M25551 Pain in right hip: Secondary | ICD-10-CM

## 2022-08-22 DIAGNOSIS — M25552 Pain in left hip: Secondary | ICD-10-CM

## 2022-08-22 NOTE — Progress Notes (Addendum)
Office Visit Note   Patient: Margaret Graham           Date of Birth: 03-23-1946           MRN: [id number] Visit Date: 08/22/2022              Requested by: Reynold Bowen, MD [place],  [place] [address] PCP: Reynold Bowen, MD   Assessment & Plan: Visit Diagnoses:  1. Pain in left hip   2. Pain in right hip     Plan: She shown IT band stretching exercises.  She will apply Voltaren gel 4 g up to 4 times daily over the trochanteric region of both hips.  She will follow-up with Dr. Ninfa Linden in 4 weeks.  Questions were encouraged and answered at length.  Dr. Delilah Shan stated that in 4 weeks that be okay with repeat injections for bilateral trochanteric bursitis.  Follow-Up Instructions: Return in about 4 weeks (around 09/19/2022).   Orders:  Orders Placed This Encounter  Procedures   XR HIPS BILAT W OR W/O PELVIS 2V   No orders of the defined types were placed in this encounter.     Procedures: No procedures performed   Clinical Data: No additional findings.   Subjective: Chief Complaint  Patient presents with   Left Hip - Pain   Right Hip - Pain    HPI Margaret Graham comes in today for bilateral hip pain.  She is having pain lateral aspect of both hips.  Is been ongoing slightly since earlier this year in spring.  She has had 3 sets of trochanteric injections by Dr. Ronnald Ramp for bilateral trochanteric bursitis last wellness approximately 6 weeks ago.  She states she gets really good relief with the injections.  She has gone to therapy and had dry needling over the trochanteric region which was not beneficial.  She denies any groin pain buttocks pain or radicular symptoms down either leg.  Review of Systems See HPI otherwise negative  Objective: Vital Signs: There were no vitals taken for this visit.  Physical Exam Constitutional:      Appearance: She is not ill-appearing or diaphoretic.  Pulmonary:     Effort: Pulmonary effort is normal.   Neurological:     Mental Status: She is alert and oriented to person, place, and time.  Psychiatric:        Mood and Affect: Mood normal.     Ortho Exam Bilateral hips excellent range of motion without pain.  She has fluid motion of both hips with external rotation and internal rotation.  Tenderness over the trochanteric region bilateral hips.  No significant tenderness down the IT band of either thigh.  Specialty Comments:  No specialty comments available.  Imaging: XR HIPS BILAT W OR W/O PELVIS 2V  Result Date: 08/22/2022 Bilateral hips and AP pelvis: No acute fractures.  Both hips well located.  Slight narrowing of the left hip joint compared to the right which appears to be symmetric throughout and without any arthritic changes.  Normal bone density.  No bony abnormalities otherwise.    PMFS History: Patient Active Problem List   Diagnosis Date Noted   Osteopenia 01/23/2016   Dyslipidemia 01/18/2016   History of esophageal stricture 01/18/2016   Postoperative hypothyroidism 01/18/2016   Esophageal reflux 01/18/2016   Snoring 01/18/2016   Past Medical History:  Diagnosis Date   Arthritis    GERD (gastroesophageal reflux disease)    Heart murmur    Hyperlipidemia  Internal hemorrhoid    Schatzki's ring    Thyroid disease     Family History  Problem Relation Age of Onset   Arthritis Mother    Arthritis Father    Hyperlipidemia Father    Diabetes Son    Asthma Son    Heart disease Paternal Uncle    Colon cancer Neg Hx    Esophageal cancer Neg Hx    Rectal cancer Neg Hx     Past Surgical History:  Procedure Laterality Date   ABDOMINAL HYSTERECTOMY     BREAST SURGERY     for dilated duct   Social History   Occupational History   Not on file  Tobacco Use   Smoking status: Never   Smokeless tobacco: Never  Vaping Use   Vaping Use: Never used  Substance and Sexual Activity   Alcohol use: Yes    Alcohol/week: 0.0 standard drinks of alcohol   Drug  use: No   Sexual activity: Not Currently    Partners: Male

## 2022-08-24 DIAGNOSIS — H353131 Nonexudative age-related macular degeneration, bilateral, early dry stage: Secondary | ICD-10-CM | POA: Diagnosis not present

## 2022-08-24 DIAGNOSIS — H524 Presbyopia: Secondary | ICD-10-CM | POA: Diagnosis not present

## 2022-08-28 DIAGNOSIS — J209 Acute bronchitis, unspecified: Secondary | ICD-10-CM | POA: Diagnosis not present

## 2022-08-28 DIAGNOSIS — R058 Other specified cough: Secondary | ICD-10-CM | POA: Diagnosis not present

## 2022-09-03 ENCOUNTER — Encounter: Payer: Self-pay | Admitting: Orthopaedic Surgery

## 2022-09-15 ENCOUNTER — Other Ambulatory Visit: Payer: Self-pay | Admitting: Gastroenterology

## 2022-09-17 DIAGNOSIS — H6502 Acute serous otitis media, left ear: Secondary | ICD-10-CM | POA: Diagnosis not present

## 2022-09-17 DIAGNOSIS — H6992 Unspecified Eustachian tube disorder, left ear: Secondary | ICD-10-CM | POA: Diagnosis not present

## 2022-09-19 ENCOUNTER — Ambulatory Visit (INDEPENDENT_AMBULATORY_CARE_PROVIDER_SITE_OTHER): Payer: Medicare Other | Admitting: Orthopaedic Surgery

## 2022-09-19 ENCOUNTER — Encounter: Payer: Self-pay | Admitting: Orthopaedic Surgery

## 2022-09-19 DIAGNOSIS — M7061 Trochanteric bursitis, right hip: Secondary | ICD-10-CM | POA: Diagnosis not present

## 2022-09-19 DIAGNOSIS — M7062 Trochanteric bursitis, left hip: Secondary | ICD-10-CM

## 2022-09-19 MED ORDER — METHYLPREDNISOLONE ACETATE 40 MG/ML IJ SUSP
40.0000 mg | INTRAMUSCULAR | Status: AC | PRN
  Administered 2022-09-19: 40 mg via INTRA_ARTICULAR

## 2022-09-19 MED ORDER — METHYLPREDNISOLONE ACETATE 40 MG/ML IJ SUSP
40.0000 mg | INTRAMUSCULAR | Status: AC | PRN
Start: 2022-09-19 — End: 2022-09-19
  Administered 2022-09-19: 40 mg via INTRA_ARTICULAR

## 2022-09-19 MED ORDER — LIDOCAINE HCL 1 % IJ SOLN
3.0000 mL | INTRAMUSCULAR | Status: AC | PRN
  Administered 2022-09-19: 3 mL

## 2022-09-19 MED ORDER — LIDOCAINE HCL 1 % IJ SOLN
3.0000 mL | INTRAMUSCULAR | Status: AC | PRN
Start: 2022-09-19 — End: 2022-09-19
  Administered 2022-09-19: 3 mL

## 2022-09-19 NOTE — Progress Notes (Signed)
The patient is an active 76 year old female well-known to me.  She has been dealing with bilateral hip trochanteric bursitis and pain.  She is interested in injections today.  She has been on prednisone recently for an ear issue.  She is not a diabetic.  She denies any specific injuries.  It has been quite sometime since she has had injections and it has been at least over 3 to 4 months.  Both hips move smoothly and fluidly.  Both hips have pain to palpation over the trochanteric area but the remainder of her hip exams are normal.  I did place a steroid injection over both hip trochanteric areas that she tolerated well.  She will continue Voltaren gel.  Stretching will help as well.  Follow-up is as needed.  All questions and concerns were addressed and answered.       Procedure Note  Patient: Margaret Graham             Date of Birth: 10/05/1946           MRN: [id number]             Visit Date: 09/19/2022  Procedures: Visit Diagnoses:  1. Trochanteric bursitis, left hip   2. Trochanteric bursitis, right hip     Large Joint Inj: R greater trochanter on 09/19/2022 10:59 AM Indications: pain and diagnostic evaluation Details: 22 G 1.5 in needle, lateral approach  Arthrogram: No  Medications: 3 mL lidocaine 1 %; 40 mg methylPREDNISolone acetate 40 MG/ML Outcome: tolerated well, no immediate complications Procedure, treatment alternatives, risks and benefits explained, specific risks discussed. Consent was given by the patient. Immediately prior to procedure a time out was called to verify the correct patient, procedure, equipment, support staff and site/side marked as required. Patient was prepped and draped in the usual sterile fashion.    Large Joint Inj: L greater trochanter on 09/19/2022 10:59 AM Indications: pain and diagnostic evaluation Details: 22 G 1.5 in needle, lateral approach  Arthrogram: No  Medications: 3 mL lidocaine 1 %; 40 mg methylPREDNISolone acetate 40  MG/ML Outcome: tolerated well, no immediate complications Procedure, treatment alternatives, risks and benefits explained, specific risks discussed. Consent was given by the patient. Immediately prior to procedure a time out was called to verify the correct patient, procedure, equipment, support staff and site/side marked as required. Patient was prepped and draped in the usual sterile fashion.

## 2022-09-24 ENCOUNTER — Encounter: Payer: Self-pay | Admitting: Orthopaedic Surgery

## 2022-10-10 DIAGNOSIS — H6502 Acute serous otitis media, left ear: Secondary | ICD-10-CM | POA: Diagnosis not present

## 2022-10-10 DIAGNOSIS — H6992 Unspecified Eustachian tube disorder, left ear: Secondary | ICD-10-CM | POA: Diagnosis not present

## 2022-10-17 ENCOUNTER — Other Ambulatory Visit: Payer: Self-pay

## 2022-10-17 ENCOUNTER — Telehealth: Payer: Self-pay | Admitting: Orthopaedic Surgery

## 2022-10-17 MED ORDER — METHYLPREDNISOLONE 4 MG PO TBPK: ORAL_TABLET | ORAL | 0 refills | Status: DC

## 2022-10-17 NOTE — Telephone Encounter (Signed)
Pt called requesting to speak to Caryl Pina B about her mychart response. Please call pt at [phone]

## 2022-10-17 NOTE — Telephone Encounter (Signed)
Patient aware Margaret Graham is in clinic and he will get to her message when he is able

## 2022-10-25 ENCOUNTER — Encounter: Payer: Self-pay | Admitting: Orthopaedic Surgery

## 2022-10-30 DIAGNOSIS — H90A32 Mixed conductive and sensorineural hearing loss, unilateral, left ear with restricted hearing on the contralateral side: Secondary | ICD-10-CM | POA: Diagnosis not present

## 2022-10-31 NOTE — Telephone Encounter (Signed)
Patient is scheduled for 11/06/2022 at 10am.

## 2022-11-06 ENCOUNTER — Encounter: Payer: Self-pay | Admitting: Orthopaedic Surgery

## 2022-11-06 ENCOUNTER — Ambulatory Visit (INDEPENDENT_AMBULATORY_CARE_PROVIDER_SITE_OTHER): Payer: Medicare Other | Admitting: Orthopaedic Surgery

## 2022-11-06 ENCOUNTER — Other Ambulatory Visit: Payer: Self-pay

## 2022-11-06 DIAGNOSIS — M25552 Pain in left hip: Secondary | ICD-10-CM | POA: Diagnosis not present

## 2022-11-06 DIAGNOSIS — M7062 Trochanteric bursitis, left hip: Secondary | ICD-10-CM

## 2022-11-06 NOTE — Progress Notes (Signed)
The patient is well-known to me.  She is still dealing with debilitating left hip pain over the trochanteric area.  She has had injections several times now over both the right and left hip.  The left hip is the one is still hurting her quite a bit.  Her last injections were about 6 weeks ago.  The right hip has come down to the left hip is just really painful and is waking her up at night and is detrimentally affecting her mobility and her quality of life.  She has been through physical therapy as well including dry needling.  She does try topical anti-inflammatories over this area.  On exam her left hip moves smoothly and fluidly with no pain in the groin at all and no blocks to rotation.  She has significant pain over the proximal trochanteric area and the IT band on that left side.  Previous x-rays show normal-appearing hips bilaterally.  This point we need to send her for an MRI of her left hip to assess the gluteus medius and minimus tendons and to see if there is any other pathology that we are missing that is causing her to have such debilitating pain given the failure of all forms and modalities of conservative treatment.  Will see her back as we have this MRI.  She agrees with this treatment plan.

## 2022-11-08 ENCOUNTER — Telehealth: Payer: Self-pay | Admitting: Orthopaedic Surgery

## 2022-11-08 NOTE — Telephone Encounter (Signed)
Patient aware this has been put in but need authorization first before ever sent to [facility]

## 2022-11-08 NOTE — Telephone Encounter (Signed)
Patient wants to now if her orders were sent over for MRI

## 2022-11-12 ENCOUNTER — Encounter: Payer: Self-pay | Admitting: Gastroenterology

## 2022-11-26 ENCOUNTER — Ambulatory Visit
Admission: RE | Admit: 2022-11-26 | Discharge: 2022-11-26 | Disposition: A | Discharge status: Home / Self Care | Payer: Medicare Other | Source: Ambulatory Visit | Attending: Orthopaedic Surgery | Admitting: Orthopaedic Surgery

## 2022-11-26 DIAGNOSIS — M25552 Pain in left hip: Secondary | ICD-10-CM

## 2022-11-26 DIAGNOSIS — M7062 Trochanteric bursitis, left hip: Secondary | ICD-10-CM

## 2022-11-29 DIAGNOSIS — H6522 Chronic serous otitis media, left ear: Secondary | ICD-10-CM | POA: Diagnosis not present

## 2022-11-29 DIAGNOSIS — H6992 Unspecified Eustachian tube disorder, left ear: Secondary | ICD-10-CM | POA: Diagnosis not present

## 2022-11-29 DIAGNOSIS — H90A32 Mixed conductive and sensorineural hearing loss, unilateral, left ear with restricted hearing on the contralateral side: Secondary | ICD-10-CM | POA: Diagnosis not present

## 2022-12-04 ENCOUNTER — Encounter: Payer: Self-pay | Admitting: Gastroenterology

## 2022-12-05 ENCOUNTER — Other Ambulatory Visit: Payer: Self-pay | Admitting: Otolaryngology

## 2022-12-05 NOTE — Telephone Encounter (Signed)
Apologies for the confusion.  It appears that the recall was not changed in the database after the most recent office visit.  She should have a colonoscopy recall for February 2026.  - HD

## 2022-12-05 NOTE — Telephone Encounter (Signed)
Dr. Loletha Carrow, will you please clarify if patient is due for recall now or 2026. Based on 10/2019 office note you recommended 7 year recall instead of 5 years. Pt received a letter stating that she is due now which is the original recall date. Please advise, thanks.

## 2022-12-11 ENCOUNTER — Encounter: Payer: Self-pay | Admitting: Orthopaedic Surgery

## 2022-12-11 ENCOUNTER — Ambulatory Visit (INDEPENDENT_AMBULATORY_CARE_PROVIDER_SITE_OTHER): Payer: Medicare Other | Admitting: Orthopaedic Surgery

## 2022-12-11 DIAGNOSIS — M25552 Pain in left hip: Secondary | ICD-10-CM | POA: Diagnosis not present

## 2022-12-11 DIAGNOSIS — M25551 Pain in right hip: Secondary | ICD-10-CM | POA: Diagnosis not present

## 2022-12-11 NOTE — Progress Notes (Signed)
HPI: Mrs. Berrios returns today for follow-up of bilateral hip pain.  Left greater than right.  She states pains unchanged.  She did undergo an MRI of her left hip without contrast dated 11/27/2022.  This showed moderate tendinosis in the probable partial tear of the left gluteus minimus tendon with associated muscular atrophy.  Also left greater than right mild gluteus medius tendinosis.  Bilateral hips with mild degenerative changes.  No labral tears.  No hip joint effusions. She continues to have pain when ambulating she denies any groin pain.  Most of the pain is lateral posterior aspect of both hips and left greater than right.  She has undergone multiple trochanteric injections without any real relief.  Does state that Voltaren gel does seem to help both hips in regards to pain but this is short-lived.   Review of systems: See HPI otherwise negative  Physical exam: General well-developed well-nourished female no acute distress. Bilateral hips she has full active rotation of both hips without pain.  She has pain posterior lateral aspect of both hips and in the gluteus region left greater than right.  Impression: Bilateral hip pain Left gluteus minimus moderate tendinosis with probable tear Bilateral gluteus medius tendinosis  Plan: Given her continued pain despite multiple trochanteric injections and conservative management including time, exercise and topical anti-inflammatories.  Recommend referral to Dr. Sammuel Hines for evaluation and treatment of the tendinosis involving the gluteus region both hips.  Questions were encouraged and answered at length by Dr. Ninfa Linden and myself.  MRI images were shown to the patient.  MRI results reviewed.

## 2022-12-14 ENCOUNTER — Other Ambulatory Visit: Payer: Self-pay | Admitting: Gastroenterology

## 2022-12-17 ENCOUNTER — Encounter (HOSPITAL_COMMUNITY): Payer: Self-pay | Admitting: Otolaryngology

## 2022-12-17 ENCOUNTER — Other Ambulatory Visit: Payer: Self-pay

## 2022-12-17 NOTE — Progress Notes (Signed)
PCP - Dr. Jessee Avers  ERAS Protcol - Clears until 0800  Anesthesia review: N  Patient verbally denies any shortness of breath, fever, cough and chest pain during phone call   -------------  SDW INSTRUCTIONS given:  Your procedure is scheduled on 12/19/22.  Report to [facility] Main Entrance "A" at 0830 A.M., and check in at the Admitting office.  Call this number if you have problems the morning of surgery:  [phone]   Remember:  Do not eat after midnight the night before your surgery  You may drink clear liquids until 0800 the morning of your surgery.   Clear liquids allowed are: Water, Non-Citrus Juices (without pulp), Carbonated Beverages, Clear Tea, Black Coffee Only, and Gatorade    Take these medicines the morning of surgery with A SIP OF WATER  REFRESH  fluticasone (FLONASE)  levothyroxine (SYNTHROID, LEVOTHROID)    As of today, STOP taking any Aspirin (unless otherwise instructed by your surgeon) Aleve, Naproxen, Ibuprofen, Motrin, Advil, Goody's, BC's, all herbal medications, fish oil, and all vitamins.                      Do not wear jewelry, make up, or nail polish            Do not wear lotions, powders, perfumes/colognes, or deodorant.            Do not shave 48 hours prior to surgery.  Men may shave face and neck.            Do not bring valuables to the hospital.            [facility] is not responsible for any belongings or valuables.  Do NOT Smoke (Tobacco/Vaping) 24 hours prior to your procedure If you use a CPAP at night, you may bring all equipment for your overnight stay.   Contacts, glasses, dentures or bridgework may not be worn into surgery.      For patients admitted to the hospital, discharge time will be determined by your treatment team.   Patients discharged the day of surgery will not be allowed to drive home, and someone needs to stay with them for 24 hours.    Special instructions:   [place]- Preparing For Surgery  Before  surgery, you can play an important role. Because skin is not sterile, your skin needs to be as free of germs as possible. You can reduce the number of germs on your skin by washing with CHG (chlorahexidine gluconate) Soap before surgery.  CHG is an antiseptic cleaner which kills germs and bonds with the skin to continue killing germs even after washing.    Oral Hygiene is also important to reduce your risk of infection.  Remember - BRUSH YOUR TEETH THE MORNING OF SURGERY WITH YOUR REGULAR TOOTHPASTE  Please do not use if you have an allergy to CHG or antibacterial soaps. If your skin becomes reddened/irritated stop using the CHG.  Do not shave (including legs and underarms) for at least 48 hours prior to first CHG shower. It is OK to shave your face.  Please follow these instructions carefully.   Shower the NIGHT BEFORE SURGERY and the MORNING OF SURGERY with DIAL Soap.   Pat yourself dry with a CLEAN TOWEL.  Wear CLEAN PAJAMAS to bed the night before surgery  Place CLEAN SHEETS on your bed the night of your first shower and DO NOT SLEEP WITH PETS.   Day of Surgery: Please shower  morning of surgery  Wear Clean/Comfortable clothing the morning of surgery Do not apply any deodorants/lotions.   Remember to brush your teeth WITH YOUR REGULAR TOOTHPASTE.   Questions were answered. Patient verbalized understanding of instructions.

## 2022-12-18 NOTE — Progress Notes (Signed)
Anesthesia Chart Review: SAME DAY WORK-UP  Case: [id number] Date/Time: 12/19/22 1050   Procedure: MYRINGOTOMY WITH TUBE PLACEMENT (Left)   Anesthesia type: General   Pre-op diagnosis:      Dysfunction of left eustachian tube     Mixed conductive and sensorineural hearing loss of left ear with restricted hearing of right ear     Chronic serous otitis media of left ear   Location: [facility] OR ROOM 09 / [place]   Surgeons: Ebbie Latus A, DO       DISCUSSION: Patient is a 77 year old female scheduled for the above procedure.  History includes never smoker, HLD, hypothyroidism, GERD, arthritis, hysterectomy (~ 1996), left thyroidectomy (~ 2004), murmur (MVP is listed in 01/14/15 notes, although denied prior echo; "no murmurs" documented in 08/28/22 note by PCP Dr. Forde Dandy).   She moved from [place] to [place] around 2017, a few old primary care notes are scanned under Media tab. Her current PCP is Reynold Bowen, MD with [facility]).    She is a same-day workup, so labs as indicated and anesthesia team evaluation on the day of surgery.   VS: Ht 5' 5"$  (1.651 m)   Wt 72.6 kg   BMI 26.63 kg/m  At 08/28/22 PCP visit: BP 136/72, HR 98   PROVIDERS: Reynold Bowen, MD is PCP    LABS: For day of surgery as indicated. 07/30/22 comparison labs in [facility] Care everywhere showe AST 14, ALT 16 and on 04/23/22 TSH 0.38, FT4 1.3, H/H 14.3/41.7, PLT 318, Cr 0.7, glucose 90.    EKG: N/A    CV: N/A. (Of note, [facility] ENT PMHx notes mention a 2019 stress test followed by a cardiac cath on 07/29/18 showing "borderline angiographic lesion on the medial segment of the LAD which was not significant by FFR. otherwise minimal irregularities with nonocclusive CAD" and a normal stress test on 7/7/212021.These are reportedly erroneous entries. There are no result reports in [facility] or Care Everywhere indicating she ever had these tests either.)    Past Medical History:  Diagnosis Date   Arthritis    GERD  (gastroesophageal reflux disease)    Heart murmur    Hyperlipidemia    Hypothyroidism    Internal hemorrhoid    Schatzki's ring    Thyroid disease     Past Surgical History:  Procedure Laterality Date   ABDOMINAL HYSTERECTOMY     BREAST SURGERY     for dilated duct    MEDICATIONS:  0.9 %  sodium chloride infusion    alendronate (FOSAMAX) 70 MG tablet   Ascorbic Acid (VITAMIN C) 1000 MG tablet   aspirin 81 MG tablet   Calcium Carb-Cholecalciferol (CALCIUM 600 + D PO)   Carboxymethylcellulose Sodium (REFRESH PLUS OP)   Cholecalciferol (VITAMIN D3) 25 MCG (1000 UT) capsule   ezetimibe (ZETIA) 10 MG tablet   famotidine (PEPCID) 40 MG tablet   fluticasone (FLONASE) 50 MCG/ACT nasal spray   levothyroxine (SYNTHROID, LEVOTHROID) 100 MCG tablet   Multiple Vitamin (MULTIVITAMIN) capsule   Multiple Vitamins-Minerals (PRESERVISION AREDS 2) CAPS   simvastatin (ZOCOR) 20 MG tablet   zinc gluconate 50 MG tablet    Myra Gianotti, PA-C Surgical Short Stay/Anesthesiology [facility] Phone [phone] [facility] Phone [phone] 12/18/2022 10:49 AM

## 2022-12-18 NOTE — Anesthesia Preprocedure Evaluation (Signed)
Anesthesia Evaluation  Patient identified by MRN, date of birth, ID band Patient awake    Reviewed: Allergy & Precautions, NPO status , Patient's Chart, lab work & pertinent test results  Airway Mallampati: II  TM Distance: >3 FB Neck ROM: Full    Dental  (+) Teeth Intact, Dental Advisory Given   Pulmonary neg pulmonary ROS   Pulmonary exam normal breath sounds clear to auscultation       Cardiovascular negative cardio ROS Normal cardiovascular exam Rhythm:Regular Rate:Normal     Neuro/Psych negative neurological ROS  negative psych ROS   GI/Hepatic Neg liver ROS,GERD  Medicated and Controlled,,  Endo/Other  Hypothyroidism    Renal/GU negative Renal ROS  negative genitourinary   Musculoskeletal  (+) Arthritis , Osteoarthritis,    Abdominal   Peds  Hematology negative hematology ROS (+)   Anesthesia Other Findings   Reproductive/Obstetrics negative OB ROS                             Anesthesia Physical Anesthesia Plan  ASA: 2  Anesthesia Plan: General   Post-op Pain Management: Tylenol PO (pre-op)*   Induction: Intravenous  PONV Risk Score and Plan: 3 and Ondansetron and Treatment may vary due to age or medical condition  Airway Management Planned: LMA  Additional Equipment: None  Intra-op Plan:   Post-operative Plan: Extubation in OR  Informed Consent: I have reviewed the patients History and Physical, chart, labs and discussed the procedure including the risks, benefits and alternatives for the proposed anesthesia with the patient or authorized representative who has indicated his/her understanding and acceptance.     Dental advisory given  Plan Discussed with: CRNA  Anesthesia Plan Comments: (  )       Anesthesia Quick Evaluation

## 2022-12-19 ENCOUNTER — Ambulatory Visit (HOSPITAL_BASED_OUTPATIENT_CLINIC_OR_DEPARTMENT_OTHER): Payer: Medicare Other | Admitting: Vascular Surgery

## 2022-12-19 ENCOUNTER — Other Ambulatory Visit: Payer: Self-pay

## 2022-12-19 ENCOUNTER — Encounter (HOSPITAL_COMMUNITY): Payer: Self-pay | Admitting: Otolaryngology

## 2022-12-19 ENCOUNTER — Encounter (HOSPITAL_COMMUNITY)
Admission: RE | Disposition: A | Discharge status: Home / Self Care | Payer: Self-pay | Source: Home / Self Care | Attending: Otolaryngology

## 2022-12-19 ENCOUNTER — Ambulatory Visit (HOSPITAL_COMMUNITY): Payer: Medicare Other | Admitting: Vascular Surgery

## 2022-12-19 ENCOUNTER — Ambulatory Visit (HOSPITAL_COMMUNITY)
Admission: RE | Admit: 2022-12-19 | Discharge: 2022-12-19 | Disposition: A | Discharge status: Home / Self Care | Payer: Medicare Other | Attending: Otolaryngology | Admitting: Otolaryngology

## 2022-12-19 DIAGNOSIS — K219 Gastro-esophageal reflux disease without esophagitis: Secondary | ICD-10-CM | POA: Insufficient documentation

## 2022-12-19 DIAGNOSIS — H90A22 Sensorineural hearing loss, unilateral, left ear, with restricted hearing on the contralateral side: Secondary | ICD-10-CM

## 2022-12-19 DIAGNOSIS — E039 Hypothyroidism, unspecified: Secondary | ICD-10-CM | POA: Diagnosis not present

## 2022-12-19 DIAGNOSIS — H6992 Unspecified Eustachian tube disorder, left ear: Secondary | ICD-10-CM | POA: Insufficient documentation

## 2022-12-19 DIAGNOSIS — E785 Hyperlipidemia, unspecified: Secondary | ICD-10-CM | POA: Diagnosis not present

## 2022-12-19 DIAGNOSIS — H6520 Chronic serous otitis media, unspecified ear: Secondary | ICD-10-CM | POA: Insufficient documentation

## 2022-12-19 DIAGNOSIS — H90A32 Mixed conductive and sensorineural hearing loss, unilateral, left ear with restricted hearing on the contralateral side: Secondary | ICD-10-CM | POA: Diagnosis not present

## 2022-12-19 DIAGNOSIS — H6522 Chronic serous otitis media, left ear: Secondary | ICD-10-CM | POA: Insufficient documentation

## 2022-12-19 HISTORY — DX: Hypothyroidism, unspecified: E03.9

## 2022-12-19 HISTORY — PX: MYRINGOTOMY WITH TUBE PLACEMENT: SHX5663

## 2022-12-19 SURGERY — MYRINGOTOMY WITH TUBE PLACEMENT
Anesthesia: General | Site: Ear | Laterality: Left

## 2022-12-19 MED ORDER — ONDANSETRON HCL 4 MG/2ML IJ SOLN
INTRAMUSCULAR | Status: AC
  Filled 2022-12-19: qty 2

## 2022-12-19 MED ORDER — CIPROFLOXACIN-DEXAMETHASONE 0.3-0.1 % OT SUSP
OTIC | Status: DC | PRN
  Administered 2022-12-19: 4 [drp] via OTIC

## 2022-12-19 MED ORDER — ONDANSETRON HCL 4 MG/2ML IJ SOLN
INTRAMUSCULAR | Status: DC | PRN
  Administered 2022-12-19: 4 mg via INTRAVENOUS

## 2022-12-19 MED ORDER — ONDANSETRON HCL 4 MG/2ML IJ SOLN: 4.0000 mg | Freq: Once | INTRAMUSCULAR | Status: DC | PRN

## 2022-12-19 MED ORDER — PROPOFOL 10 MG/ML IV BOLUS
INTRAVENOUS | Status: AC
  Filled 2022-12-19: qty 20

## 2022-12-19 MED ORDER — CIPROFLOXACIN-DEXAMETHASONE 0.3-0.1 % OT SUSP
OTIC | Status: AC
  Filled 2022-12-19: qty 7.5

## 2022-12-19 MED ORDER — ACETAMINOPHEN 500 MG PO TABS
1000.0000 mg | ORAL_TABLET | Freq: Once | ORAL | Status: AC
  Administered 2022-12-19: 1000 mg via ORAL
  Filled 2022-12-19: qty 2

## 2022-12-19 MED ORDER — OXYCODONE HCL 5 MG/5ML PO SOLN: 5.0000 mg | Freq: Once | ORAL | Status: DC | PRN

## 2022-12-19 MED ORDER — OXYCODONE HCL 5 MG PO TABS: 5.0000 mg | ORAL_TABLET | Freq: Once | ORAL | Status: DC | PRN

## 2022-12-19 MED ORDER — LIDOCAINE 2% (20 MG/ML) 5 ML SYRINGE
INTRAMUSCULAR | Status: AC
  Filled 2022-12-19: qty 5

## 2022-12-19 MED ORDER — FENTANYL CITRATE (PF) 100 MCG/2ML IJ SOLN: 25.0000 ug | INTRAMUSCULAR | Status: DC | PRN

## 2022-12-19 MED ORDER — ORAL CARE MOUTH RINSE: 15.0000 mL | Freq: Once | OROMUCOSAL | Status: AC

## 2022-12-19 MED ORDER — DEXAMETHASONE SODIUM PHOSPHATE 10 MG/ML IJ SOLN
INTRAMUSCULAR | Status: AC
  Filled 2022-12-19: qty 1

## 2022-12-19 MED ORDER — FENTANYL CITRATE (PF) 100 MCG/2ML IJ SOLN
INTRAMUSCULAR | Status: DC | PRN
  Administered 2022-12-19 (×2): 50 ug via INTRAVENOUS

## 2022-12-19 MED ORDER — LACTATED RINGERS IV SOLN: INTRAVENOUS | Status: DC

## 2022-12-19 MED ORDER — DEXAMETHASONE SODIUM PHOSPHATE 10 MG/ML IJ SOLN
INTRAMUSCULAR | Status: DC | PRN
  Administered 2022-12-19: 10 mg via INTRAVENOUS

## 2022-12-19 MED ORDER — CHLORHEXIDINE GLUCONATE 0.12 % MT SOLN
15.0000 mL | Freq: Once | OROMUCOSAL | Status: AC
  Administered 2022-12-19: 15 mL via OROMUCOSAL
  Filled 2022-12-19: qty 15

## 2022-12-19 MED ORDER — PROPOFOL 10 MG/ML IV BOLUS
INTRAVENOUS | Status: DC | PRN
  Administered 2022-12-19: 130 mg via INTRAVENOUS

## 2022-12-19 MED ORDER — FENTANYL CITRATE (PF) 100 MCG/2ML IJ SOLN
INTRAMUSCULAR | Status: AC
  Filled 2022-12-19: qty 2

## 2022-12-19 MED ORDER — LIDOCAINE 2% (20 MG/ML) 5 ML SYRINGE
INTRAMUSCULAR | Status: DC | PRN
  Administered 2022-12-19: 60 mg via INTRAVENOUS

## 2022-12-19 SURGICAL SUPPLY — 26 items
ASPIRATOR COLLECTOR MID EAR (MISCELLANEOUS) IMPLANT
BAG COUNTER SPONGE SURGICOUNT (BAG) ×1 IMPLANT
BLADE MYRINGOTOMY 6 SPEAR HDL (BLADE) ×1 IMPLANT
BLADE SURG 15 STRL LF DISP TIS (BLADE) IMPLANT
BLADE SURG 15 STRL SS (BLADE)
CANISTER SUCT 3000ML PPV (MISCELLANEOUS) ×1 IMPLANT
CNTNR URN SCR LID CUP LEK RST (MISCELLANEOUS) ×1 IMPLANT
CONT SPEC 4OZ STRL OR WHT (MISCELLANEOUS) ×1
COTTONBALL LRG STERILE PKG (GAUZE/BANDAGES/DRESSINGS) ×1 IMPLANT
COVER MAYO STAND STRL (DRAPES) ×1 IMPLANT
DRAPE HALF SHEET 40X57 (DRAPES) ×1 IMPLANT
GLOVE BIO SURGEON STRL SZ 6.5 (GLOVE) ×1 IMPLANT
KIT TURNOVER KIT B (KITS) ×1 IMPLANT
MARKER SKIN DUAL TIP RULER LAB (MISCELLANEOUS) ×1 IMPLANT
NDL HYPO 25GX1X1/2 BEV (NEEDLE) IMPLANT
NEEDLE HYPO 25GX1X1/2 BEV (NEEDLE) IMPLANT
NS IRRIG 1000ML POUR BTL (IV SOLUTION) ×1 IMPLANT
PAD ARMBOARD 7.5X6 YLW CONV (MISCELLANEOUS) ×1 IMPLANT
POSITIONER HEAD DONUT 9IN (MISCELLANEOUS) IMPLANT
SYR BULB EAR ULCER 3OZ GRN STR (SYRINGE) IMPLANT
TOWEL GREEN STERILE FF (TOWEL DISPOSABLE) ×1 IMPLANT
TUBE CONNECTING 12X1/4 (SUCTIONS) ×1 IMPLANT
TUBE EAR ARMSTRONG FL 1.14X3.5 (OTOLOGIC RELATED) IMPLANT
TUBE EAR PAPARELLA TYPE 1 (OTOLOGIC RELATED) IMPLANT
TUBE EAR SHEEHY BUTTON 1.27 (OTOLOGIC RELATED) ×2 IMPLANT
TUBE EAR T MOD 1.32X4.8 BL (OTOLOGIC RELATED) IMPLANT

## 2022-12-19 NOTE — Transfer of Care (Signed)
Immediate Anesthesia Transfer of Care Note  Patient: Margaret Graham  Procedure(s) Performed: MYRINGOTOMY WITH TUBE PLACEMENT (Left: Ear)  Patient Location: PACU  Anesthesia Type:General  Level of Consciousness: awake, alert , and oriented  Airway & Oxygen Therapy: Patient Spontanous Breathing  Post-op Assessment: Report given to RN and Post -op Vital signs reviewed and stable  Post vital signs: Reviewed and stable  Last Vitals:  Vitals Value Taken Time  BP 114/72   Temp    Pulse 71 12/19/22 1119  Resp 15 12/19/22 1119  SpO2 97 % 12/19/22 1119  Vitals shown include unvalidated device data.  Last Pain:  Vitals:   12/19/22 0855  TempSrc:   PainSc: 0-No pain         Complications: No notable events documented.

## 2022-12-19 NOTE — H&P (Signed)
Margaret Graham is an 77 y.o. female.    Chief Complaint:  Left serous otitis media with effusion  HPI: Patient presents today for planned elective procedure. She denies any interval change in history since office visit on 11/29/2022:  Margaret Graham is a 77 y.o. female who presents as a return consult, referred by [place], MD, for evaluation and treatment of left serous otitis media with effusion. Patient was initially seen for her symptoms and [place] on 09/17/2022, at which time she developed left-sided ear fullness and hearing loss following a recent URI. Medical management was recommended due to length of patient's symptoms as well as noted difficulty anatomy which would make placing an ear tube difficult. She was prescribed prednisone, which she completed. She states that she did use Flonase around that time, but has not used it in well over a month. She was subsequently seen in our office by Pietro Cassis, PA for her symptoms who noted persistent serous effusion in the left ear. She subsequently had an audiogram performed on 10/30/2022 which reported mixed hearing loss in the left ear, with sloping sensorineural hearing loss in the right ear and an abnormal tympanogram in the left ear. Patient presents today to discuss option of ear tube placement.   Past Medical History:  Diagnosis Date   Arthritis    GERD (gastroesophageal reflux disease)    Heart murmur    Hyperlipidemia    Hypothyroidism    Internal hemorrhoid    Schatzki's ring    Thyroid disease     Past Surgical History:  Procedure Laterality Date   ABDOMINAL HYSTERECTOMY     BREAST SURGERY     for dilated duct    Family History  Problem Relation Age of Onset   Arthritis Mother    Arthritis Father    Hyperlipidemia Father    Diabetes Son    Asthma Son    Heart disease Paternal Uncle    Colon cancer Neg Hx    Esophageal cancer Neg Hx    Rectal cancer Neg Hx     Social History:  reports that  she has never smoked. She has never used smokeless tobacco. She reports current alcohol use of about 1.0 standard drink of alcohol per week. She reports that she does not use drugs.  Allergies:  Allergies  Allergen Reactions   Declomycin [Demeclocycline]     Facility-Administered Medications Prior to Admission  Medication Dose Route Frequency Provider Last Rate Last Admin   0.9 %  sodium chloride infusion  500 mL Intravenous Once Doran Stabler, MD       Medications Prior to Admission  Medication Sig Dispense Refill   alendronate (FOSAMAX) 70 MG tablet Take 70 mg by mouth once a week. Take with a full glass of water on an empty stomach.     Ascorbic Acid (VITAMIN C) 1000 MG tablet Take 1,000 mg by mouth daily.     aspirin 81 MG tablet Take 81 mg by mouth daily.     Calcium Carb-Cholecalciferol (CALCIUM 600 + D PO) Take by mouth.     Carboxymethylcellulose Sodium (REFRESH PLUS OP) Place 1 drop into both eyes daily. PF     Cholecalciferol (VITAMIN D3) 25 MCG (1000 UT) capsule Take 1,000 Units by mouth daily.     ezetimibe (ZETIA) 10 MG tablet Take 10 mg by mouth daily.     famotidine (PEPCID) 40 MG tablet TAKE 1 TABLET(40 MG) BY MOUTH DAILY 90 tablet 0  fluticasone (FLONASE) 50 MCG/ACT nasal spray Place 2 sprays into both nostrils in the morning.     levothyroxine (SYNTHROID, LEVOTHROID) 100 MCG tablet Take 1 tablet (100 mcg total) by mouth every morning. 30 tablet 3   Multiple Vitamin (MULTIVITAMIN) capsule Take 1 capsule by mouth daily.     Multiple Vitamins-Minerals (PRESERVISION AREDS 2) CAPS Take 1 tablet by mouth 2 (two) times daily.     simvastatin (ZOCOR) 20 MG tablet Take 1 tablet (20 mg total) by mouth daily. 30 tablet 0   zinc gluconate 50 MG tablet Take 50 mg by mouth daily.      No results found for this or any previous visit (from the past 48 hour(s)). No results found.  ROS: ROS  Blood pressure 131/73, pulse 75, temperature 97.7 F (36.5 C), temperature source  Oral, resp. rate 18, height 5' 5"$  (1.651 m), weight 71.2 kg, SpO2 98 %.  PHYSICAL EXAM: Physical Exam Constitutional:      Appearance: Normal appearance.  HENT:     Right Ear: External ear normal.     Left Ear: External ear normal.     Mouth/Throat:     Mouth: Mucous membranes are moist.  Pulmonary:     Effort: Pulmonary effort is normal.  Neurological:     General: No focal deficit present.     Mental Status: She is alert.  Psychiatric:        Mood and Affect: Mood normal.        Behavior: Behavior normal.     Studies Reviewed: None   Assessment/Plan Margaret Graham is a 77 y.o. female with history of serous otitis media of the left ear and associated mixed hearing loss.  -To OR today for left myringotomy and ear tube placement. Risks reviewed, all questions answered.    Margaret Graham A Jahmeer Porche 12/19/2022, 10:42 AM

## 2022-12-19 NOTE — Anesthesia Procedure Notes (Signed)
Procedure Name: LMA Insertion Date/Time: 12/19/2022 11:04 AM  Performed by: Genelle Bal, CRNAPre-anesthesia Checklist: Patient identified, Emergency Drugs available, Suction available and Patient being monitored Patient Re-evaluated:Patient Re-evaluated prior to induction Oxygen Delivery Method: Circle system utilized Preoxygenation: Pre-oxygenation with 100% oxygen Induction Type: IV induction Ventilation: Mask ventilation without difficulty LMA: LMA inserted LMA Size: 4.0 Number of attempts: 1 Airway Equipment and Method: Bite block Placement Confirmation: positive ETCO2 Tube secured with: Tape Dental Injury: Teeth and Oropharynx as per pre-operative assessment

## 2022-12-19 NOTE — Anesthesia Postprocedure Evaluation (Signed)
Anesthesia Post Note  Patient: Margaret Graham  Procedure(s) Performed: MYRINGOTOMY WITH TUBE PLACEMENT (Left: Ear)     Patient location during evaluation: PACU Anesthesia Type: General Level of consciousness: awake and alert, oriented and patient cooperative Pain management: pain level controlled Vital Signs Assessment: post-procedure vital signs reviewed and stable Respiratory status: spontaneous breathing, nonlabored ventilation and respiratory function stable Cardiovascular status: blood pressure returned to baseline and stable Postop Assessment: no apparent nausea or vomiting Anesthetic complications: no   No notable events documented.  Last Vitals:  Vitals:   12/19/22 1130 12/19/22 1145  BP: 112/62 (!) 117/58  Pulse: 65 65  Resp: 18 18  Temp:  36.8 C  SpO2: 97% 96%    Last Pain:  Vitals:   12/19/22 1145  TempSrc:   PainSc: 0-No pain   Pain Goal:                   [name]

## 2022-12-19 NOTE — Progress Notes (Signed)
Notified Dr. Doroteo Glassman that pt had not had labs in one year. Per Dr. Doroteo Glassman no labs needed for this procedure.

## 2022-12-19 NOTE — Op Note (Signed)
OPERATIVE NOTE  Margaret Graham Date/Time of Admission: 12/19/2022  8:20 AM  CSN: [id number] Attending Provider: Ebbie Latus A, DO Room/Bed: MCPO/NONE DOB: 09-19-1946 Age: 77 y.o.   Pre-Op Diagnosis: Dysfunction of left eustachian tube Mixed conductive and sensorineural hearing loss of left ear with restricted hearing of right ear Chronic serous otitis media of left ear  Post-Op Diagnosis: Dysfunction of left eustachian tube Mixed conductive and sensorineural hearing loss of left ear with restricted hearing of right ear Chronic serous otitis media of left ear  Procedure: Procedure(s): LEFT MYRINGOTOMY WITH TUBE PLACEMENT  Anesthesia: General  Surgeon(s): Samyra Limb A Kendrix Orman, DO  Staff: Circulator: Hal Morales, RN Scrub Person: Dollene Cleveland T  Implants: * No implants in log *  Specimens: * No specimens in log *  Complications: None  EBL: <1 ML  Condition: stable  Operative Findings:  Serous effusion of left ear, no evidence of infection  Description of Operation: Once operative consent was obtained, and the surgical site confirmed with the operating room team, the patient was brought back to the operating room and mask inhalational anesthesia was obtained. The patient was turned over to the ENT service. An operating microscope was used to visualize the left external auditory canal and tympanic membrane. Ceratectomy was performed. A radial incision was made in the anterior inferior quadrant of the tympanic membrane. Middle ear contents were suctioned and a Donaldson pressure equalization was placed through the incision. Ciprodex drops were placed in the ear. The patient was turned back over to the anesthesia service. The patient was then transferred to the PACU in stable condition.    Jason Coop, [facility]  12/19/2022

## 2022-12-19 NOTE — Discharge Instructions (Signed)
BMT Post Operative Instructions [facility]  Effects of Anesthesia Placing ear ventilation tubes (BMT) involves a very brief anesthesia, typically 5 minutes  or less. Patients may be quite irritable for 15-45 minutes after surgery, most return to  normal activity the same day. Nausea and vomiting is rarely seen, and usually resolves  by the evening of surgery - even without additional medications.  Medications:  Your doctor may give you ear drops to use after surgery: Use them as  directed by your surgeon.   Keep these drops when you are done using them because they are used  to treat clogged tubes, ear infections and chronic drainage when ear tubes  are in place.  Most patients do not need pain medications after this surgery, however  you may use regular Tylenol or Ibuprofen if needed.  Other effects of surgery:  You may see a small amount of blood from the ears for the first day or two.  This is normal. If the bleeding persists, you can place a few drops of Afrin (oxymetazoline) in the ears, which will help slow down/stop the bleeding.   Drainage usually occurs in the first few days after surgery. If it continues  after drops (if prescribed) are discontinued, call the doctor's office.  Low-grade fever may occur. Tylenol or Ibuprofen (either oral or  suppository) can be used.   Patients can return to normal activity, school or daycare the following day  after surgery.  Hearing is generally improved after tubes are inserted. Because of this, you may be sensitive to or startle with loud sounds until used to the improved hearing.  How long do tubes stay in the ears? Ear tubes remain in the ears for anywhere from 6-24 months. The average is about a  year. On infrequent occasions, they stay in the ears for several years and have to be  removed with another surgery. The tubes usually spontaneously extrude and in such  event it will be found lying loose in the ear canal or be completely  gone at a follow up  visit. The patient will probably not know when the tube comes out and it will do no harm  lying in the canal until it is removed.  What should I do if I see bleeding from the ears? Small amounts of blood soon after surgery are normal. If bleeding is seen from the ears  several months later, you may either be having an infection, an inflammatory  reaction against the tubes, or the tube is beginning to migrate out. If this happens, call  the doctor's office for further instructions.  Can I swim with tubes? Patients can swim in chlorinated pools after a BMT without earplugs. You should  avoid diving into the water. You should always use earplugs when swimming in lakes or  in the ocean.  Bathing No ear plugs are needed when bathing.  General information  Patients can still have ear infections even with tubes. Tubes will let fluid drain  out of the ear, allow for less (or no) pain, and also allow the use of topical  antibiotics instead of oral antibiotics.   Drainage from the ears is common when ear tubes are in place. It can be  normal or an indication of infection. If you see drainage from the ears for more  than 1-2 days, call the office for instructions.   Some patients will need another set of tubes after their first set come out.  Should this occur, children  often have an adenoidectomy done with the  second set of tubes as this improves drainage of the middle ear.  You will be seen a few weeks after surgery for a hearing test to confirm  tube placement and patency.   Rarely when the tubes fall out, the eardrum does not heal, leaving a hole in  the eardrum. This is called a tympanic membrane perforation and can be  repaired with surgery.

## 2022-12-20 ENCOUNTER — Encounter (HOSPITAL_COMMUNITY): Payer: Self-pay | Admitting: Otolaryngology

## 2022-12-20 ENCOUNTER — Ambulatory Visit (HOSPITAL_BASED_OUTPATIENT_CLINIC_OR_DEPARTMENT_OTHER): Payer: Medicare Other | Admitting: Orthopaedic Surgery

## 2022-12-28 ENCOUNTER — Other Ambulatory Visit (HOSPITAL_BASED_OUTPATIENT_CLINIC_OR_DEPARTMENT_OTHER): Payer: Self-pay

## 2022-12-28 ENCOUNTER — Ambulatory Visit (INDEPENDENT_AMBULATORY_CARE_PROVIDER_SITE_OTHER): Payer: Medicare Other | Admitting: Orthopaedic Surgery

## 2022-12-28 ENCOUNTER — Ambulatory Visit (HOSPITAL_BASED_OUTPATIENT_CLINIC_OR_DEPARTMENT_OTHER): Payer: Self-pay | Admitting: Orthopaedic Surgery

## 2022-12-28 DIAGNOSIS — S76012A Strain of muscle, fascia and tendon of left hip, initial encounter: Secondary | ICD-10-CM

## 2022-12-28 MED ORDER — ACETAMINOPHEN 500 MG PO TABS
500.0000 mg | ORAL_TABLET | Freq: Three times a day (TID) | ORAL | 0 refills | Status: AC
  Filled 2022-12-28: qty 30, 10d supply, fill #0

## 2022-12-28 MED ORDER — ASPIRIN 325 MG PO TBEC
325.0000 mg | DELAYED_RELEASE_TABLET | Freq: Every day | ORAL | 0 refills | Status: DC
  Filled 2022-12-28: qty 30, 30d supply, fill #0

## 2022-12-28 MED ORDER — IBUPROFEN 800 MG PO TABS
800.0000 mg | ORAL_TABLET | Freq: Three times a day (TID) | ORAL | 0 refills | Status: AC
  Filled 2022-12-28: qty 30, 10d supply, fill #0

## 2022-12-28 MED ORDER — OXYCODONE HCL 5 MG PO TABS
5.0000 mg | ORAL_TABLET | ORAL | 0 refills | Status: DC | PRN
  Filled 2022-12-28: qty 20, 4d supply, fill #0

## 2022-12-28 NOTE — Progress Notes (Signed)
Chief Complaint: Left hip pain     History of Present Illness:    Margaret Graham is a 77 y.o. female presents today with ongoing left hip pain that has been worse going up and down stairs.  She is experiencing this from multiple years.  She has had 3 to 4 injections in the greater trochanteric area.  She has been previously seen by my partner Dr. Ninfa Linden.  She states that she got excellent relief from her initial injection.  She has undergone physical therapy for strengthening of the hip without any relief.  She has pain when walking over 50 status.  She is retired.  She is here today with her husband both of whom are from [place].  She is having a hard time with laying on the side.  There is achiness that does go down the leg.    Surgical History:   None  PMH/PSH/Family History/Social History/Meds/Allergies:    Past Medical History:  Diagnosis Date   Arthritis    GERD (gastroesophageal reflux disease)    Heart murmur    Hyperlipidemia    Hypothyroidism    Internal hemorrhoid    Schatzki's ring    Thyroid disease    Past Surgical History:  Procedure Laterality Date   ABDOMINAL HYSTERECTOMY     BREAST SURGERY     for dilated duct   MYRINGOTOMY WITH TUBE PLACEMENT Left 12/19/2022   Procedure: MYRINGOTOMY WITH TUBE PLACEMENT;  Surgeon: Jason Coop, DO;  Location: [facility] OR;  Service: ENT;  Laterality: Left;   Social History   Socioeconomic History   Marital status: Married    Spouse name: Not on file   Number of children: 2   Years of education: Not on file   Highest education level: Not on file  Occupational History   Not on file  Tobacco Use   Smoking status: Never   Smokeless tobacco: Never  Vaping Use   Vaping Use: Never used  Substance and Sexual Activity   Alcohol use: Yes    Alcohol/week: 1.0 standard drink of alcohol    Types: 1 Glasses of wine per week    Comment: 1 glass a day   Drug use: No   Sexual  activity: Not Currently    Partners: Male  Other Topics Concern   Not on file  Social History Narrative   Retired from [employer] to [employer] 07/2015.   From [place].   Social Determinants of Health   Financial Resource Strain: Not on file  Food Insecurity: Not on file  Transportation Needs: Not on file  Physical Activity: Not on file  Stress: Not on file  Social Connections: Not on file   Family History  Problem Relation Age of Onset   Arthritis Mother    Arthritis Father    Hyperlipidemia Father    Diabetes Son    Asthma Son    Heart disease Paternal Uncle    Colon cancer Neg Hx    Esophageal cancer Neg Hx    Rectal cancer Neg Hx    Allergies  Allergen Reactions   Declomycin [Demeclocycline]    Current Outpatient Medications  Medication Sig Dispense Refill   acetaminophen (TYLENOL) 500 MG tablet Take 1 tablet (500 mg total) by mouth every 8 (eight) hours for 10 days.  30 tablet 0   aspirin EC 325 MG tablet Take 1 tablet (325 mg total) by mouth daily. 30 tablet 0   ibuprofen (ADVIL) 800 MG tablet Take 1 tablet (800 mg total) by mouth every 8 (eight) hours for 10 days. Please take with food, please alternate with acetaminophen 30 tablet 0   oxyCODONE (OXY IR/ROXICODONE) 5 MG immediate release tablet Take 1 tablet (5 mg total) by mouth every 4 (four) hours as needed (severe pain). 20 tablet 0   alendronate (FOSAMAX) 70 MG tablet Take 70 mg by mouth once a week. Take with a full glass of water on an empty stomach.     Ascorbic Acid (VITAMIN C) 1000 MG tablet Take 1,000 mg by mouth daily.     aspirin 81 MG tablet Take 81 mg by mouth daily.     Calcium Carb-Cholecalciferol (CALCIUM 600 + D PO) Take by mouth.     Carboxymethylcellulose Sodium (REFRESH PLUS OP) Place 1 drop into both eyes daily. PF     Cholecalciferol (VITAMIN D3) 25 MCG (1000 UT) capsule Take 1,000 Units by mouth daily.     ezetimibe (ZETIA) 10 MG tablet Take 10 mg by mouth daily.     famotidine (PEPCID) 40  MG tablet TAKE 1 TABLET(40 MG) BY MOUTH DAILY 90 tablet 0   fluticasone (FLONASE) 50 MCG/ACT nasal spray Place 2 sprays into both nostrils in the morning.     levothyroxine (SYNTHROID, LEVOTHROID) 100 MCG tablet Take 1 tablet (100 mcg total) by mouth every morning. 30 tablet 3   Multiple Vitamin (MULTIVITAMIN) capsule Take 1 capsule by mouth daily.     Multiple Vitamins-Minerals (PRESERVISION AREDS 2) CAPS Take 1 tablet by mouth 2 (two) times daily.     simvastatin (ZOCOR) 20 MG tablet Take 1 tablet (20 mg total) by mouth daily. 30 tablet 0   zinc gluconate 50 MG tablet Take 50 mg by mouth daily.     Current Facility-Administered Medications  Medication Dose Route Frequency Provider Last Rate Last Admin   0.9 %  sodium chloride infusion  500 mL Intravenous Once [name], MD       No results found.  Review of Systems:   A ROS was performed including pertinent positives and negatives as documented in the HPI.  Physical Exam :   Constitutional: NAD and appears stated age Neurological: Alert and oriented Psych: Appropriate affect and cooperative There were no vitals taken for this visit.   Comprehensive Musculoskeletal Exam:    Inspection Right Left  Skin No atrophy or gross abnormalities appreciated No atrophy or gross abnormalities appreciated  Palpation    Tenderness None Lateral trochanter  Crepitus None None  Range of Motion    Flexion (passive) 120 120  Extension 30 30  IR 45 45  ER 45 45  Strength    Flexion  5/5 5/5  Extension 5/5 5/5  Special Tests    FABER Negative Negative  FADIR Negative Negative  ER Lag/Capsular Insufficiency Negative Negative  Instability Negative Negative  Sacroiliac pain Negative  Negative   Instability    Generalized Laxity No No  Neurologic    sciatic, femoral, obturator nerves intact to light sensation  Vascular/Lymphatic    DP pulse 2+ 2+  Lumbar Exam    Patient has symmetric lumbar range of motion with negative pain  referral to hip   Positive Trendelenburg on the left with weakness with standing stance  Imaging:   Xray (3 views left hip): Mild  degenerative findings about the femoral acetabular joint  MRI (left hip): Left worse than right gluteus minimus and medius tearing.  There is some mild atrophy involving the body of the gluteus medius tendon as well.  I personally reviewed and interpreted the radiographs.   Assessment:   77 y.o. female with left gluteus medius tearing.  At this time she has trialed for injections which are no longer giving her relief.  She has also undergone physical therapy again with little relief.  Given that we did discuss surgical options.  I did specifically discuss gluteus medius repair.  Given the fact that she does have some atrophy on the T1 imaging I do believe that she may ultimately benefit from repair with patch augmentation.  I did discuss the rehab and recovery associated with this.  After further discussion she has elected for left hip gluteus repair with patch augmentation  Plan :    -Plan for left hip gluteus medius repair with collagen patch augmentation     I personally saw and evaluated the patient, and participated in the management and treatment plan.  Vanetta Mulders, MD Attending Physician, Orthopedic Surgery  This document was dictated using Dragon voice recognition software. A reasonable attempt at proof reading has been made to minimize errors.

## 2022-12-30 ENCOUNTER — Encounter (HOSPITAL_BASED_OUTPATIENT_CLINIC_OR_DEPARTMENT_OTHER): Payer: Self-pay | Admitting: Orthopaedic Surgery

## 2023-01-04 ENCOUNTER — Encounter (HOSPITAL_BASED_OUTPATIENT_CLINIC_OR_DEPARTMENT_OTHER): Payer: Self-pay | Admitting: Orthopaedic Surgery

## 2023-01-04 NOTE — Telephone Encounter (Signed)
I called patient and discussed scheduling surgery. All questions were answered.

## 2023-01-11 ENCOUNTER — Telehealth: Payer: Self-pay | Admitting: Orthopaedic Surgery

## 2023-01-11 NOTE — Telephone Encounter (Signed)
Patient is scheduled for left hip surgery on 02/12/23. Patient is worried about possible nausea after surgery, and would like for doctor to call her in something for nausea just in case. Patient uses [facility] on [address], [place]. Please advise.

## 2023-01-14 ENCOUNTER — Other Ambulatory Visit (HOSPITAL_BASED_OUTPATIENT_CLINIC_OR_DEPARTMENT_OTHER): Payer: Self-pay | Admitting: Orthopaedic Surgery

## 2023-01-14 MED ORDER — ONDANSETRON 8 MG PO TBDP: 8.0000 mg | ORAL_TABLET | Freq: Three times a day (TID) | ORAL | 0 refills | Status: DC | PRN

## 2023-01-14 NOTE — Telephone Encounter (Signed)
LMOM relaying message.  

## 2023-01-15 DIAGNOSIS — Z4589 Encounter for adjustment and management of other implanted devices: Secondary | ICD-10-CM | POA: Diagnosis not present

## 2023-01-15 DIAGNOSIS — Z9622 Myringotomy tube(s) status: Secondary | ICD-10-CM | POA: Diagnosis not present

## 2023-01-15 DIAGNOSIS — H903 Sensorineural hearing loss, bilateral: Secondary | ICD-10-CM | POA: Diagnosis not present

## 2023-01-15 DIAGNOSIS — H6992 Unspecified Eustachian tube disorder, left ear: Secondary | ICD-10-CM | POA: Diagnosis not present

## 2023-01-29 ENCOUNTER — Encounter: Payer: Self-pay | Admitting: Gastroenterology

## 2023-01-31 ENCOUNTER — Encounter (HOSPITAL_BASED_OUTPATIENT_CLINIC_OR_DEPARTMENT_OTHER): Payer: Self-pay | Admitting: Orthopaedic Surgery

## 2023-02-05 ENCOUNTER — Encounter (HOSPITAL_BASED_OUTPATIENT_CLINIC_OR_DEPARTMENT_OTHER): Payer: Self-pay | Admitting: Orthopaedic Surgery

## 2023-02-05 ENCOUNTER — Other Ambulatory Visit: Payer: Self-pay

## 2023-02-06 ENCOUNTER — Ambulatory Visit (HOSPITAL_BASED_OUTPATIENT_CLINIC_OR_DEPARTMENT_OTHER): Payer: Self-pay | Admitting: Orthopaedic Surgery

## 2023-02-06 ENCOUNTER — Encounter (HOSPITAL_BASED_OUTPATIENT_CLINIC_OR_DEPARTMENT_OTHER): Payer: Self-pay | Admitting: Orthopaedic Surgery

## 2023-02-06 ENCOUNTER — Encounter (HOSPITAL_BASED_OUTPATIENT_CLINIC_OR_DEPARTMENT_OTHER): Payer: Medicare Other | Admitting: Orthopaedic Surgery

## 2023-02-06 DIAGNOSIS — S76019A Strain of muscle, fascia and tendon of unspecified hip, initial encounter: Secondary | ICD-10-CM | POA: Insufficient documentation

## 2023-02-06 NOTE — Progress Notes (Signed)

## 2023-02-12 ENCOUNTER — Other Ambulatory Visit: Payer: Self-pay

## 2023-02-12 ENCOUNTER — Ambulatory Visit (HOSPITAL_BASED_OUTPATIENT_CLINIC_OR_DEPARTMENT_OTHER): Payer: Medicare Other | Admitting: Anesthesiology

## 2023-02-12 ENCOUNTER — Encounter (HOSPITAL_BASED_OUTPATIENT_CLINIC_OR_DEPARTMENT_OTHER): Payer: Self-pay | Admitting: Orthopaedic Surgery

## 2023-02-12 ENCOUNTER — Observation Stay (HOSPITAL_COMMUNITY)
Admission: RE | Admit: 2023-02-12 | Discharge: 2023-02-13 | Disposition: A | Discharge status: Home / Self Care | Payer: Medicare Other | Attending: Orthopaedic Surgery | Admitting: Orthopaedic Surgery

## 2023-02-12 ENCOUNTER — Encounter (HOSPITAL_BASED_OUTPATIENT_CLINIC_OR_DEPARTMENT_OTHER)
Admission: RE | Disposition: A | Discharge status: Home / Self Care | Payer: Self-pay | Source: Home / Self Care | Attending: Orthopaedic Surgery

## 2023-02-12 DIAGNOSIS — S76011A Strain of muscle, fascia and tendon of right hip, initial encounter: Principal | ICD-10-CM | POA: Insufficient documentation

## 2023-02-12 DIAGNOSIS — S76012A Strain of muscle, fascia and tendon of left hip, initial encounter: Secondary | ICD-10-CM

## 2023-02-12 DIAGNOSIS — E039 Hypothyroidism, unspecified: Secondary | ICD-10-CM | POA: Insufficient documentation

## 2023-02-12 DIAGNOSIS — Z7982 Long term (current) use of aspirin: Secondary | ICD-10-CM | POA: Diagnosis not present

## 2023-02-12 DIAGNOSIS — X58XXXA Exposure to other specified factors, initial encounter: Secondary | ICD-10-CM | POA: Insufficient documentation

## 2023-02-12 DIAGNOSIS — Z79899 Other long term (current) drug therapy: Secondary | ICD-10-CM | POA: Diagnosis not present

## 2023-02-12 DIAGNOSIS — S76019A Strain of muscle, fascia and tendon of unspecified hip, initial encounter: Secondary | ICD-10-CM | POA: Diagnosis present

## 2023-02-12 DIAGNOSIS — Z01818 Encounter for other preprocedural examination: Secondary | ICD-10-CM

## 2023-02-12 HISTORY — PX: GLUTEUS MINIMUS REPAIR: SHX5843

## 2023-02-12 SURGERY — REPAIR, TENDON, GLUTEUS MINIMUS
Anesthesia: General | Site: Hip | Laterality: Left

## 2023-02-12 MED ORDER — KETOROLAC TROMETHAMINE 30 MG/ML IJ SOLN
30.0000 mg | Freq: Once | INTRAMUSCULAR | Status: AC
  Administered 2023-02-12: 30 mg via INTRAVENOUS

## 2023-02-12 MED ORDER — DOCUSATE SODIUM 100 MG PO CAPS: 200.0000 mg | ORAL_CAPSULE | Freq: Every day | ORAL | Status: DC

## 2023-02-12 MED ORDER — LACTATED RINGERS IV SOLN: INTRAVENOUS | Status: DC

## 2023-02-12 MED ORDER — MAGNESIUM 200 MG PO TABS: ORAL_TABLET | Freq: Every day | ORAL | Status: DC

## 2023-02-12 MED ORDER — FLUTICASONE PROPIONATE 50 MCG/ACT NA SUSP: 2.0000 | Freq: Every morning | NASAL | Status: DC

## 2023-02-12 MED ORDER — CEFAZOLIN SODIUM-DEXTROSE 2-4 GM/100ML-% IV SOLN
INTRAVENOUS | Status: AC
  Filled 2023-02-12: qty 100

## 2023-02-12 MED ORDER — ROCURONIUM BROMIDE 10 MG/ML (PF) SYRINGE
PREFILLED_SYRINGE | INTRAVENOUS | Status: AC
  Filled 2023-02-12: qty 10

## 2023-02-12 MED ORDER — FENTANYL CITRATE (PF) 100 MCG/2ML IJ SOLN
INTRAMUSCULAR | Status: AC
  Filled 2023-02-12: qty 2

## 2023-02-12 MED ORDER — BUPIVACAINE HCL 0.25 % IJ SOLN
INTRAMUSCULAR | Status: DC | PRN
  Administered 2023-02-12: 20 mL

## 2023-02-12 MED ORDER — TRANEXAMIC ACID-NACL 1000-0.7 MG/100ML-% IV SOLN
INTRAVENOUS | Status: AC
  Filled 2023-02-12: qty 100

## 2023-02-12 MED ORDER — GABAPENTIN 300 MG PO CAPS
ORAL_CAPSULE | ORAL | Status: AC
  Filled 2023-02-12: qty 1

## 2023-02-12 MED ORDER — ACETAMINOPHEN 325 MG PO TABS: 325.0000 mg | ORAL_TABLET | Freq: Four times a day (QID) | ORAL | Status: DC | PRN

## 2023-02-12 MED ORDER — PROPOFOL 10 MG/ML IV BOLUS
INTRAVENOUS | Status: AC
  Filled 2023-02-12: qty 20

## 2023-02-12 MED ORDER — OXYCODONE HCL 5 MG PO TABS
5.0000 mg | ORAL_TABLET | ORAL | Status: DC | PRN
  Administered 2023-02-12: 5 mg via ORAL
  Filled 2023-02-12: qty 1

## 2023-02-12 MED ORDER — 0.9 % SODIUM CHLORIDE (POUR BTL) OPTIME
TOPICAL | Status: DC | PRN
  Administered 2023-02-12: 300 mL

## 2023-02-12 MED ORDER — ASCORBIC ACID 500 MG PO TABS
1000.0000 mg | ORAL_TABLET | Freq: Every day | ORAL | Status: DC
  Filled 2023-02-12: qty 2

## 2023-02-12 MED ORDER — LIDOCAINE HCL (CARDIAC) PF 100 MG/5ML IV SOSY
PREFILLED_SYRINGE | INTRAVENOUS | Status: DC | PRN
  Administered 2023-02-12: 80 mg via INTRAVENOUS

## 2023-02-12 MED ORDER — SIMVASTATIN 20 MG PO TABS
20.0000 mg | ORAL_TABLET | Freq: Every day | ORAL | Status: DC
  Administered 2023-02-12: 20 mg via ORAL
  Filled 2023-02-12: qty 1

## 2023-02-12 MED ORDER — ONDANSETRON HCL 4 MG/2ML IJ SOLN
INTRAMUSCULAR | Status: DC | PRN
  Administered 2023-02-12: 4 mg via INTRAVENOUS

## 2023-02-12 MED ORDER — FENTANYL CITRATE (PF) 100 MCG/2ML IJ SOLN
25.0000 ug | INTRAMUSCULAR | Status: DC | PRN
  Administered 2023-02-12: 25 ug via INTRAVENOUS
  Administered 2023-02-12 (×2): 50 ug via INTRAVENOUS
  Administered 2023-02-12: 25 ug via INTRAVENOUS

## 2023-02-12 MED ORDER — TRANEXAMIC ACID-NACL 1000-0.7 MG/100ML-% IV SOLN
1000.0000 mg | INTRAVENOUS | Status: AC
  Administered 2023-02-12: 1000 mg via INTRAVENOUS

## 2023-02-12 MED ORDER — DEXAMETHASONE SODIUM PHOSPHATE 10 MG/ML IJ SOLN
INTRAMUSCULAR | Status: AC
  Filled 2023-02-12: qty 1

## 2023-02-12 MED ORDER — DEXAMETHASONE SODIUM PHOSPHATE 4 MG/ML IJ SOLN
INTRAMUSCULAR | Status: DC | PRN
  Administered 2023-02-12: 5 mg via INTRAVENOUS

## 2023-02-12 MED ORDER — PHENYLEPHRINE HCL (PRESSORS) 10 MG/ML IV SOLN
INTRAVENOUS | Status: DC | PRN
  Administered 2023-02-12: 80 ug via INTRAVENOUS
  Administered 2023-02-12 (×4): 160 ug via INTRAVENOUS

## 2023-02-12 MED ORDER — ONDANSETRON 4 MG PO TBDP: 8.0000 mg | ORAL_TABLET | Freq: Three times a day (TID) | ORAL | Status: DC | PRN

## 2023-02-12 MED ORDER — DEXMEDETOMIDINE HCL IN NACL 80 MCG/20ML IV SOLN
INTRAVENOUS | Status: DC | PRN
  Administered 2023-02-12: 8 ug via BUCCAL

## 2023-02-12 MED ORDER — FENTANYL CITRATE (PF) 100 MCG/2ML IJ SOLN
INTRAMUSCULAR | Status: DC | PRN
  Administered 2023-02-12: 50 ug via INTRAVENOUS

## 2023-02-12 MED ORDER — ONDANSETRON HCL 4 MG/2ML IJ SOLN
INTRAMUSCULAR | Status: AC
  Filled 2023-02-12: qty 2

## 2023-02-12 MED ORDER — LEVOTHYROXINE SODIUM 100 MCG PO TABS
100.0000 ug | ORAL_TABLET | ORAL | Status: DC
  Administered 2023-02-13: 100 ug via ORAL
  Filled 2023-02-12: qty 1

## 2023-02-12 MED ORDER — HYDROMORPHONE HCL 1 MG/ML IJ SOLN: 0.5000 mg | INTRAMUSCULAR | Status: DC | PRN

## 2023-02-12 MED ORDER — SODIUM CHLORIDE 0.9 % IV SOLN: INTRAVENOUS | Status: DC

## 2023-02-12 MED ORDER — ONDANSETRON HCL 4 MG/2ML IJ SOLN: 4.0000 mg | Freq: Once | INTRAMUSCULAR | Status: DC | PRN

## 2023-02-12 MED ORDER — EZETIMIBE 10 MG PO TABS
10.0000 mg | ORAL_TABLET | Freq: Every day | ORAL | Status: DC
  Administered 2023-02-12: 10 mg via ORAL
  Filled 2023-02-12: qty 1

## 2023-02-12 MED ORDER — ASPIRIN 325 MG PO TBEC: 325.0000 mg | DELAYED_RELEASE_TABLET | Freq: Every day | ORAL | Status: DC

## 2023-02-12 MED ORDER — PHENYLEPHRINE 80 MCG/ML (10ML) SYRINGE FOR IV PUSH (FOR BLOOD PRESSURE SUPPORT)
PREFILLED_SYRINGE | INTRAVENOUS | Status: AC
  Filled 2023-02-12: qty 10

## 2023-02-12 MED ORDER — OXYCODONE HCL 5 MG PO TABS: 10.0000 mg | ORAL_TABLET | ORAL | Status: DC | PRN

## 2023-02-12 MED ORDER — ROCURONIUM BROMIDE 100 MG/10ML IV SOLN
INTRAVENOUS | Status: DC | PRN
  Administered 2023-02-12: 50 mg via INTRAVENOUS

## 2023-02-12 MED ORDER — ONDANSETRON HCL 4 MG/2ML IJ SOLN: 4.0000 mg | Freq: Four times a day (QID) | INTRAMUSCULAR | Status: DC | PRN

## 2023-02-12 MED ORDER — KETOROLAC TROMETHAMINE 30 MG/ML IJ SOLN
INTRAMUSCULAR | Status: AC
  Filled 2023-02-12: qty 1

## 2023-02-12 MED ORDER — CEFAZOLIN SODIUM-DEXTROSE 2-4 GM/100ML-% IV SOLN
2.0000 g | INTRAVENOUS | Status: AC
  Administered 2023-02-12: 2 g via INTRAVENOUS

## 2023-02-12 MED ORDER — SUGAMMADEX SODIUM 200 MG/2ML IV SOLN
INTRAVENOUS | Status: DC | PRN
  Administered 2023-02-12: 200 mg via INTRAVENOUS

## 2023-02-12 MED ORDER — MIDAZOLAM HCL 2 MG/2ML IJ SOLN
INTRAMUSCULAR | Status: AC
  Filled 2023-02-12: qty 2

## 2023-02-12 MED ORDER — ACETAMINOPHEN 500 MG PO TABS
1000.0000 mg | ORAL_TABLET | Freq: Four times a day (QID) | ORAL | Status: DC
  Administered 2023-02-12 – 2023-02-13 (×2): 1000 mg via ORAL
  Filled 2023-02-12 (×2): qty 2

## 2023-02-12 MED ORDER — FAMOTIDINE 20 MG PO TABS
40.0000 mg | ORAL_TABLET | Freq: Every day | ORAL | Status: DC
  Administered 2023-02-12: 40 mg via ORAL
  Filled 2023-02-12 (×2): qty 2

## 2023-02-12 MED ORDER — ACETAMINOPHEN 500 MG PO TABS
ORAL_TABLET | ORAL | Status: AC
  Filled 2023-02-12: qty 2

## 2023-02-12 MED ORDER — ACETAMINOPHEN 500 MG PO TABS
1000.0000 mg | ORAL_TABLET | Freq: Once | ORAL | Status: AC
  Administered 2023-02-12: 1000 mg via ORAL

## 2023-02-12 MED ORDER — GABAPENTIN 300 MG PO CAPS
300.0000 mg | ORAL_CAPSULE | Freq: Once | ORAL | Status: AC
  Administered 2023-02-12: 300 mg via ORAL

## 2023-02-12 MED ORDER — DOCUSATE SODIUM 100 MG PO CAPS
100.0000 mg | ORAL_CAPSULE | Freq: Two times a day (BID) | ORAL | Status: DC
  Filled 2023-02-12: qty 1

## 2023-02-12 MED ORDER — VITAMIN D3 25 MCG (1000 UNIT) PO TABS
1000.0000 [IU] | ORAL_TABLET | Freq: Every day | ORAL | Status: DC
  Filled 2023-02-12: qty 1

## 2023-02-12 MED ORDER — ONDANSETRON HCL 4 MG PO TABS: 4.0000 mg | ORAL_TABLET | Freq: Four times a day (QID) | ORAL | Status: DC | PRN

## 2023-02-12 MED ORDER — PROPOFOL 10 MG/ML IV BOLUS
INTRAVENOUS | Status: DC | PRN
  Administered 2023-02-12: 150 mg via INTRAVENOUS

## 2023-02-12 SURGICAL SUPPLY — 56 items
ADH SKN CLS APL DERMABOND .7 (GAUZE/BANDAGES/DRESSINGS) ×1
ANCH SUT KNTLS STRL SHLDR SYS (Anchor) ×2 IMPLANT
ANCHOR JUGGERKNOT SOFT 1.5 (Anchor) ×2 IMPLANT
ANCHOR JUGGERKNOT SOFT 2.9 (Anchor) IMPLANT
ANCHOR SUT QUATTRO KNTLS 4.5 (Anchor) IMPLANT
APL PRP STRL LF DISP 70% ISPRP (MISCELLANEOUS) ×1
BLADE SURG 10 STRL SS (BLADE) ×1 IMPLANT
BLADE SURG 15 STRL LF DISP TIS (BLADE) ×2 IMPLANT
BLADE SURG 15 STRL SS (BLADE) ×2
CANISTER SUCT 1200ML W/VALVE (MISCELLANEOUS) ×1 IMPLANT
CHLORAPREP W/TINT 26 (MISCELLANEOUS) ×1 IMPLANT
CLSR STERI-STRIP ANTIMIC 1/2X4 (GAUZE/BANDAGES/DRESSINGS) ×1 IMPLANT
COOLER ICEMAN CLASSIC (MISCELLANEOUS) ×1 IMPLANT
COVER BACK TABLE 60X90IN (DRAPES) ×1 IMPLANT
COVER MAYO STAND STRL (DRAPES) ×1 IMPLANT
DERMABOND ADVANCED .7 DNX12 (GAUZE/BANDAGES/DRESSINGS) ×1 IMPLANT
DRAPE IMP U-DRAPE 54X76 (DRAPES) IMPLANT
DRAPE STERI IOBAN 125X83 (DRAPES) ×1 IMPLANT
DRAPE U-SHAPE 47X51 STRL (DRAPES) ×2 IMPLANT
DRSG AQUACEL AG ADV 3.5X 6 (GAUZE/BANDAGES/DRESSINGS) ×1 IMPLANT
DRSG AQUACEL AG ADV 3.5X10 (GAUZE/BANDAGES/DRESSINGS) IMPLANT
ELECT BLADE 4.0 EZ CLEAN MEGAD (MISCELLANEOUS)
ELECT REM PT RETURN 9FT ADLT (ELECTROSURGICAL) ×1
ELECTRODE BLDE 4.0 EZ CLN MEGD (MISCELLANEOUS) ×1 IMPLANT
ELECTRODE REM PT RTRN 9FT ADLT (ELECTROSURGICAL) ×1 IMPLANT
GAUZE 4X4 16PLY ~~LOC~~+RFID DBL (SPONGE) IMPLANT
GAUZE PAD ABD 8X10 STRL (GAUZE/BANDAGES/DRESSINGS) IMPLANT
GAUZE XEROFORM 1X8 LF (GAUZE/BANDAGES/DRESSINGS) IMPLANT
GLOVE BIO SURGEON STRL SZ 6 (GLOVE) ×2 IMPLANT
GLOVE BIO SURGEON STRL SZ7.5 (GLOVE) ×2 IMPLANT
GLOVE BIOGEL PI IND STRL 6.5 (GLOVE) ×1 IMPLANT
GLOVE BIOGEL PI IND STRL 8 (GLOVE) ×1 IMPLANT
GOWN STRL REUS W/ TWL LRG LVL3 (GOWN DISPOSABLE) ×2 IMPLANT
GOWN STRL REUS W/TWL LRG LVL3 (GOWN DISPOSABLE) ×2
GOWN STRL REUS W/TWL XL LVL3 (GOWN DISPOSABLE) ×1 IMPLANT
IMPL TAPESTRY BIOINTEGR 40X30 (Mesh General) IMPLANT
MANIFOLD NEPTUNE II (INSTRUMENTS) ×1 IMPLANT
NS IRRIG 1000ML POUR BTL (IV SOLUTION) ×1 IMPLANT
PACK BASIN DAY SURGERY FS (CUSTOM PROCEDURE TRAY) ×1 IMPLANT
PAD COLD SHLDR WRAP-ON (PAD) ×1 IMPLANT
PENCIL SMOKE EVACUATOR (MISCELLANEOUS) ×1 IMPLANT
SHEET MEDIUM DRAPE 40X70 STRL (DRAPES) IMPLANT
SPIKE FLUID TRANSFER (MISCELLANEOUS) IMPLANT
SPONGE T-LAP 18X18 ~~LOC~~+RFID (SPONGE) ×1 IMPLANT
SUCTION FRAZIER HANDLE 10FR (MISCELLANEOUS) ×1
SUCTION TUBE FRAZIER 10FR DISP (MISCELLANEOUS) ×1 IMPLANT
SUT MNCRL AB 3-0 PS2 27 (SUTURE) ×1 IMPLANT
SUT VIC AB 0 CT1 27 (SUTURE) ×2
SUT VIC AB 0 CT1 27XBRD ANBCTR (SUTURE) ×1 IMPLANT
SUT VIC AB 2-0 CT1 27 (SUTURE) ×2
SUT VIC AB 2-0 CT1 TAPERPNT 27 (SUTURE) ×1 IMPLANT
SYR 20ML LL LF (SYRINGE) IMPLANT
SYR BULB EAR ULCER 3OZ GRN STR (SYRINGE) ×1 IMPLANT
TOWEL GREEN STERILE FF (TOWEL DISPOSABLE) ×2 IMPLANT
UNDERPAD 30X36 HEAVY ABSORB (UNDERPADS AND DIAPERS) IMPLANT
YANKAUER SUCT BULB TIP NO VENT (SUCTIONS) ×1 IMPLANT

## 2023-02-12 NOTE — Anesthesia Preprocedure Evaluation (Addendum)
Anesthesia Evaluation  Patient identified by MRN, date of birth, ID band Patient awake    Reviewed: Allergy & Precautions, NPO status , Patient's Chart, lab work & pertinent test results  Airway Mallampati: II  TM Distance: >3 FB Neck ROM: Full    Dental  (+) Teeth Intact, Dental Advisory Given   Pulmonary neg pulmonary ROS   Pulmonary exam normal breath sounds clear to auscultation       Cardiovascular Exercise Tolerance: Good negative cardio ROS Normal cardiovascular exam Rhythm:Regular Rate:Normal     Neuro/Psych negative neurological ROS  negative psych ROS   GI/Hepatic Neg liver ROS,GERD  Medicated,,  Endo/Other  Hypothyroidism    Renal/GU negative Renal ROS     Musculoskeletal  (+) Arthritis ,  LEFT GLUTEUS MEDIUS TEAR   Abdominal   Peds  Hematology negative hematology ROS (+)   Anesthesia Other Findings Day of surgery medications reviewed with the patient.  Reproductive/Obstetrics                             Anesthesia Physical Anesthesia Plan  ASA: 2  Anesthesia Plan: General   Post-op Pain Management: Tylenol PO (pre-op)* and Gabapentin PO (pre-op)*   Induction: Intravenous  PONV Risk Score and Plan: 3 and Dexamethasone, Ondansetron and Treatment may vary due to age or medical condition  Airway Management Planned: Oral ETT  Additional Equipment:   Intra-op Plan:   Post-operative Plan: Extubation in OR  Informed Consent: I have reviewed the patients History and Physical, chart, labs and discussed the procedure including the risks, benefits and alternatives for the proposed anesthesia with the patient or authorized representative who has indicated his/her understanding and acceptance.     Dental advisory given  Plan Discussed with: CRNA  Anesthesia Plan Comments:        Anesthesia Quick Evaluation

## 2023-02-12 NOTE — Op Note (Signed)
   Date of Surgery: 02/12/2023  INDICATIONS: Ms. Gick is a 77 y.o.-year-old female with left hip gluteus full tendon tear.  The risk and benefits of the procedure were discussed in detail and documented in the pre-operative evaluation.   PREOPERATIVE DIAGNOSIS: 1. Left hip gluteus medius full thickness tear  POSTOPERATIVE DIAGNOSIS: Same.  PROCEDURE: 1. Left hip gluteus medius repair with collagen patch augmentation 2. Left hip trochanteric bursectomy  SURGEON: Benancio Deeds MD  ASSISTANT: Kerby Less, ATC  ANESTHESIA:  general  IV FLUIDS AND URINE: See anesthesia record.  ANTIBIOTICS: Ancef  ESTIMATED BLOOD LOSS: 15 mL.  IMPLANTS:  Implant Name Type Inv. Item Serial No. Manufacturer Lot No. LRB No. Used Action  ANCHOR JUGGERKNOT SOFT 1.5 - [id number] Anchor ANCHOR JUGGERKNOT SOFT 1.5  ZIMMER [employer]ON(ORTH,TRAU,BIO,SG) [id number] Left 2 Implanted  ANCHOR SUT QUATTRO KNTLS 4.5 - [id number] Anchor ANCHOR SUT QUATTRO KNTLS 4.5  ZIMMER RECON(ORTH,TRAU,BIO,SG) [id number] Left 1 Implanted  ANCHOR SUT QUATTRO KNTLS 4.5 - [id number] Anchor ANCHOR SUT QUATTRO KNTLS 4.5  ZIMMER RECON(ORTH,TRAU,BIO,SG) [id number] Left 1 Implanted  IMPL TAPESTRY BIOINTEGR 40X30 - [id number] Mesh General IMPL TAPESTRY BIOINTEGR 40X30  ZIMMER RECON(ORTH,TRAU,BIO,SG) [id number] Left 1 Implanted    DRAINS: None  CULTURES: None  COMPLICATIONS: none  DESCRIPTION OF PROCEDURE:  The patient was identified in the preoperative holding area.  The correct site was marked and confirmed according to nursing.  The patient was subsequently taken back to the operating room.  Antibiotics were given 1 hour prior to incision.  Anesthesia was induced.  He was placed in the lateral position with a beanbag positioner with care to pad the down leg and peroneal nerve.   Patient was prepped and draped in the usual sterile fashion.  Again timeout was performed confirming the correct side.  An approach was made over the lateral aspect of  the greater trochanter.  Dissection was initially carried down sharply with 15 blade and electrocautery was used to perform hemostasis.  A 15 blade was used to make a nick in the IT band which was completed with Mayo scissors.  At this point we encountered the gluteus medius tendon. There was overlying bursa was dissected out sharply with Metzenbaum scissors and removed. This was extremely thin and then completely torn.  This was debrided and the trochanteric lateral posterior facet was prepared using a Cobb.  We then mobilized the gluteus medius abductor muscles with an Allis clamp.  Excess bursal tissue was excised sharply with Mayo scissors.  A double row type configuration was then utilized with 2 Medial Row all suture anchors (a total of 8 limbs).  These were placed through the tissue and then brought to 2 Lateral Row Quattro anchors.  This produced an anatomic footprint restoration of the gluteus tendon.At this time a collagen patch was sutured with 0 vicryl over the footprint of the repair given the quality of the tendon.   The wounds were thoroughly irrigated.  We closed in layers of 0 Vicryl for the IT band, 2-0 Vicryl, and staples for skin. The patient was awoken and taken to the PACU.  All counts were correct at the end of the case and no complications.       POSTOPERATIVE PLAN: She will be touchdown weight bearing for 2 weeks. She will be admitted for a 23 hour observation. She will be placed on aspirin.   Benancio Deeds, MD 10:31 AM

## 2023-02-12 NOTE — Brief Op Note (Signed)
   Brief Op Note  Date of Surgery: 02/12/2023  Preoperative Diagnosis: LEFT GLUTEUS MEDIUS TEAR  Postoperative Diagnosis: same  Procedure: Procedure(s): LEFT GLUTEUS MEDIUS REPAIR WITH POSSIBLE COLLAGEN PATCH PLACEMENT  Implants: Implant Name Type Inv. Item Serial No. Manufacturer Lot No. LRB No. Used Action  ANCHOR JUGGERKNOT SOFT 1.5 - [id number] Anchor ANCHOR JUGGERKNOT SOFT 1.5  ZIMMER [employer]ON(ORTH,TRAU,BIO,SG) [id number] Left 2 Implanted  ANCHOR SUT QUATTRO KNTLS 4.5 - [id number] Anchor ANCHOR SUT QUATTRO KNTLS 4.5  ZIMMER RECON(ORTH,TRAU,BIO,SG) [id number] Left 1 Implanted  ANCHOR SUT QUATTRO KNTLS 4.5 - [id number] Anchor ANCHOR SUT QUATTRO KNTLS 4.5  ZIMMER [employer]ON(ORTH,TRAU,BIO,SG) [id number] Left 1 Implanted  IMPL TAPESTRY BIOINTEGR 40X30 - [id number] Mesh General IMPL TAPESTRY BIOINTEGR 40X30  ZIMMER RECON(ORTH,TRAU,BIO,SG) [id number] Left 1 Implanted    Surgeons: Surgeon(s): Huel Cote, MD  Anesthesia: Regional    Estimated Blood Loss: See anesthesia record  Complications: None  Condition to PACU: Stable  Benancio Deeds, MD 02/12/2023 10:31 AM

## 2023-02-12 NOTE — H&P (Signed)
Chief Complaint: Left hip pain        History of Present Illness:      Margaret Graham is a 77 y.o. female presents today with ongoing left hip pain that has been worse going up and down stairs.  She is experiencing this from multiple years.  She has had 3 to 4 injections in the greater trochanteric area.  She has been previously seen by my partner Dr. Magnus Ivan.  She states that she got excellent relief from her initial injection.  She has undergone physical therapy for strengthening of the hip without any relief.  She has pain when walking over 50 status.  She is retired.  She is here today with her husband both of whom are from [place].  She is having a hard time with laying on the side.  There is achiness that does go down the leg.       Surgical History:   None   PMH/PSH/Family History/Social History/Meds/Allergies:         Past Medical History:  Diagnosis Date   Arthritis     GERD (gastroesophageal reflux disease)     Heart murmur     Hyperlipidemia     Hypothyroidism     Internal hemorrhoid     Schatzki's ring     Thyroid disease           Past Surgical History:  Procedure Laterality Date   ABDOMINAL HYSTERECTOMY       BREAST SURGERY        for dilated duct   MYRINGOTOMY WITH TUBE PLACEMENT Left 12/19/2022    Procedure: MYRINGOTOMY WITH TUBE PLACEMENT;  Surgeon: Laren Boom, DO;  Location: [facility] OR;  Service: ENT;  Laterality: Left;    Social History         Socioeconomic History   Marital status: Married      Spouse name: Not on file   Number of children: 2   Years of education: Not on file   Highest education level: Not on file  Occupational History   Not on file  Tobacco Use   Smoking status: Never   Smokeless tobacco: Never  Vaping Use   Vaping Use: Never used  Substance and Sexual Activity   Alcohol use: Yes      Alcohol/week: 1.0 standard drink of alcohol      Types: 1 Glasses of wine per week      Comment: 1  glass a day   Drug use: No   Sexual activity: Not Currently      Partners: Male  Other Topics Concern   Not on file  Social History Narrative    Retired from [employer].    Moved to [facility] 07/2015.    From [place].    Social Determinants of Health    Financial Resource Strain: Not on file  Food Insecurity: Not on file  Transportation Needs: Not on file  Physical Activity: Not on file  Stress: Not on file  Social Connections: Not on file         Family History  Problem Relation Age of Onset   Arthritis Mother     Arthritis Father     Hyperlipidemia Father     Diabetes Son     Asthma Son     Heart disease Paternal Uncle     Colon cancer Neg Hx     Esophageal cancer Neg Hx     Rectal cancer Neg Hx  Allergies  Allergen Reactions   Declomycin [Demeclocycline]            Current Outpatient Medications  Medication Sig Dispense Refill   acetaminophen (TYLENOL) 500 MG tablet Take 1 tablet (500 mg total) by mouth every 8 (eight) hours for 10 days. 30 tablet 0   aspirin EC 325 MG tablet Take 1 tablet (325 mg total) by mouth daily. 30 tablet 0   ibuprofen (ADVIL) 800 MG tablet Take 1 tablet (800 mg total) by mouth every 8 (eight) hours for 10 days. Please take with food, please alternate with acetaminophen 30 tablet 0   oxyCODONE (OXY IR/ROXICODONE) 5 MG immediate release tablet Take 1 tablet (5 mg total) by mouth every 4 (four) hours as needed (severe pain). 20 tablet 0   alendronate (FOSAMAX) 70 MG tablet Take 70 mg by mouth once a week. Take with a full glass of water on an empty stomach.       Ascorbic Acid (VITAMIN C) 1000 MG tablet Take 1,000 mg by mouth daily.       aspirin 81 MG tablet Take 81 mg by mouth daily.       Calcium Carb-Cholecalciferol (CALCIUM 600 + D PO) Take by mouth.       Carboxymethylcellulose Sodium (REFRESH PLUS OP) Place 1 drop into both eyes daily. PF       Cholecalciferol (VITAMIN D3) 25 MCG (1000 UT) capsule Take 1,000 Units by mouth daily.        ezetimibe (ZETIA) 10 MG tablet Take 10 mg by mouth daily.       famotidine (PEPCID) 40 MG tablet TAKE 1 TABLET(40 MG) BY MOUTH DAILY 90 tablet 0   fluticasone (FLONASE) 50 MCG/ACT nasal spray Place 2 sprays into both nostrils in the morning.       levothyroxine (SYNTHROID, LEVOTHROID) 100 MCG tablet Take 1 tablet (100 mcg total) by mouth every morning. 30 tablet 3   Multiple Vitamin (MULTIVITAMIN) capsule Take 1 capsule by mouth daily.       Multiple Vitamins-Minerals (PRESERVISION AREDS 2) CAPS Take 1 tablet by mouth 2 (two) times daily.       simvastatin (ZOCOR) 20 MG tablet Take 1 tablet (20 mg total) by mouth daily. 30 tablet 0   zinc gluconate 50 MG tablet Take 50 mg by mouth daily.                 Current Facility-Administered Medications  Medication Dose Route Frequency Provider Last Rate Last Admin   0.9 %  sodium chloride infusion  500 mL Intravenous Once Charlie Pitter III, MD        Imaging Results (Last 48 hours)  No results found.     Review of Systems:   A ROS was performed including pertinent positives and negatives as documented in the HPI.   Physical Exam :   Constitutional: NAD and appears stated age Neurological: Alert and oriented Psych: Appropriate affect and cooperative There were no vitals taken for this visit.    Comprehensive Musculoskeletal Exam:     Inspection Right Left  Skin No atrophy or gross abnormalities appreciated No atrophy or gross abnormalities appreciated  Palpation      Tenderness None Lateral trochanter  Crepitus None None  Range of Motion      Flexion (passive) 120 120  Extension 30 30  IR 45 45  ER 45 45  Strength      Flexion  5/5 5/5  Extension 5/5 5/5  Special Tests  FABER Negative Negative  FADIR Negative Negative  ER Lag/Capsular Insufficiency Negative Negative  Instability Negative Negative  Sacroiliac pain Negative  Negative   Instability      Generalized Laxity No No  Neurologic      sciatic, femoral, obturator  nerves intact to light sensation  Vascular/Lymphatic      DP pulse 2+ 2+  Lumbar Exam      Patient has symmetric lumbar range of motion with negative pain referral to hip    Positive Trendelenburg on the left with weakness with standing stance   Imaging:   Xray (3 views left hip): Mild degenerative findings about the femoral acetabular joint   MRI (left hip): Left worse than right gluteus minimus and medius tearing.  There is some mild atrophy involving the body of the gluteus medius tendon as well.   I personally reviewed and interpreted the radiographs.     Assessment:   77 y.o. female with left gluteus medius tearing.  At this time she has trialed for injections which are no longer giving her relief.  She has also undergone physical therapy again with little relief.  Given that we did discuss surgical options.  I did specifically discuss gluteus medius repair.  Given the fact that she does have some atrophy on the T1 imaging I do believe that she may ultimately benefit from repair with patch augmentation.  I did discuss the rehab and recovery associated with this.  After further discussion she has elected for left hip gluteus repair with patch augmentation   Plan :     -Plan for left hip gluteus medius repair with collagen patch augmentation         I personally saw and evaluated the patient, and participated in the management and treatment plan.   Huel Cote, MD Attending Physician, Orthopedic Surgery   This document was dictated using Dragon voice recognition software. A reasonable attempt at proof reading has been made to minimize errors.

## 2023-02-12 NOTE — Anesthesia Procedure Notes (Signed)
Procedure Name: Intubation Date/Time: 02/12/2023 9:50 AM  Performed by: Cleda Clarks, CRNAPre-anesthesia Checklist: Patient identified, Emergency Drugs available, Suction available and Patient being monitored Patient Re-evaluated:Patient Re-evaluated prior to induction Oxygen Delivery Method: Circle system utilized Preoxygenation: Pre-oxygenation with 100% oxygen Induction Type: IV induction Ventilation: Mask ventilation without difficulty Laryngoscope Size: Miller and 2 Grade View: Grade II Tube type: Oral Tube size: 7.0 mm Number of attempts: 1 Airway Equipment and Method: Stylet and Oral airway Placement Confirmation: ETT inserted through vocal cords under direct vision, positive ETCO2 and breath sounds checked- equal and bilateral Tube secured with: Tape Dental Injury: Teeth and Oropharynx as per pre-operative assessment

## 2023-02-12 NOTE — Interval H&P Note (Signed)
History and Physical Interval Note:  02/12/2023 7:08 AM  Margaret Graham  has presented today for surgery, with the diagnosis of LEFT GLUTEUS MEDIUS TEAR.  The various methods of treatment have been discussed with the patient and family. After consideration of risks, benefits and other options for treatment, the patient has consented to  Procedure(s): LEFT GLUTEUS MEDIUS REPAIR WITH POSSIBLE COLLAGEN PATCH PLACEMENT (Left) as a surgical intervention.  The patient's history has been reviewed, patient examined, no change in status, stable for surgery.  I have reviewed the patient's chart and labs.  Questions were answered to the patient's satisfaction.     Huel Cote

## 2023-02-12 NOTE — Transfer of Care (Signed)
Immediate Anesthesia Transfer of Care Note  Patient: Margaret Graham  Procedure(s) Performed: LEFT GLUTEUS MEDIUS REPAIR WITH POSSIBLE COLLAGEN PATCH PLACEMENT (Left: Hip)  Patient Location: PACU  Anesthesia Type:General  Level of Consciousness: awake, alert , and oriented  Airway & Oxygen Therapy: Patient Spontanous Breathing and Patient connected to face mask oxygen  Post-op Assessment: Report given to RN and Post -op Vital signs reviewed and stable  Post vital signs: Reviewed and stable  Last Vitals:  Vitals Value Taken Time  BP 135/70 02/12/23 1100  Temp    Pulse 89 02/12/23 1102  Resp 16 02/12/23 1102  SpO2 96 % 02/12/23 1102  Vitals shown include unvalidated device data.  Last Pain:  Vitals:   02/12/23 0726  TempSrc: Tympanic  PainSc: 0-No pain      Patients Stated Pain Goal: 5 (02/12/23 0726)  Complications: No notable events documented.

## 2023-02-12 NOTE — Anesthesia Postprocedure Evaluation (Signed)
Anesthesia Post Note  Patient: Margaret Graham  Procedure(s) Performed: LEFT GLUTEUS MEDIUS REPAIR WITH POSSIBLE COLLAGEN PATCH PLACEMENT (Left: Hip)     Patient location during evaluation: PACU Anesthesia Type: General Level of consciousness: awake and alert Pain management: pain level controlled Vital Signs Assessment: post-procedure vital signs reviewed and stable Respiratory status: spontaneous breathing, nonlabored ventilation, respiratory function stable and patient connected to nasal cannula oxygen Cardiovascular status: blood pressure returned to baseline and stable Postop Assessment: no apparent nausea or vomiting Anesthetic complications: no   No notable events documented.  Last Vitals:  Vitals:   02/12/23 1115 02/12/23 1130  BP: 129/71 136/78  Pulse: 71 76  Resp: 10 11  Temp:    SpO2: 100% 100%    Last Pain:  Vitals:   02/12/23 1115  TempSrc:   PainSc: 9                  Collene Schlichter

## 2023-02-13 ENCOUNTER — Encounter (HOSPITAL_BASED_OUTPATIENT_CLINIC_OR_DEPARTMENT_OTHER): Payer: Self-pay | Admitting: Orthopaedic Surgery

## 2023-02-13 DIAGNOSIS — S76011A Strain of muscle, fascia and tendon of right hip, initial encounter: Secondary | ICD-10-CM | POA: Diagnosis not present

## 2023-02-13 DIAGNOSIS — Z7982 Long term (current) use of aspirin: Secondary | ICD-10-CM | POA: Diagnosis not present

## 2023-02-13 DIAGNOSIS — E039 Hypothyroidism, unspecified: Secondary | ICD-10-CM | POA: Diagnosis not present

## 2023-02-13 DIAGNOSIS — Z79899 Other long term (current) drug therapy: Secondary | ICD-10-CM | POA: Diagnosis not present

## 2023-02-13 NOTE — Discharge Instructions (Signed)
     Discharge Instructions    Attending Surgeon: Jonay Hitchcock, MD Office Phone Number: [phone]   Diagnosis and Procedures:    Surgeries Performed: Left hip gluteus medius repair  Discharge Plan:    Diet: Resume usual diet. Begin with light or bland foods.  Drink plenty of fluids.  Activity:  Touchdown weight bearing left leg. You are advised to go home directly from the hospital or surgical center. Restrict your activities.  GENERAL INSTRUCTIONS: 1.  Please apply ice to your wound to help with swelling and inflammation. This will improve your comfort and your overall recovery following surgery.     2. Please call Dr. Griffey Nicasio's office at [phone] with questions Monday-Friday during business hours. If no one answers, please leave a message and someone should get back to the patient within 24 hours. For emergencies please call 911 or proceed to the emergency room.   3. Patient to notify surgical team if experiences any of the following: Bowel/Bladder dysfunction, uncontrolled pain, nerve/muscle weakness, incision with increased drainage or redness, nausea/vomiting and Fever greater than 101.0 F.  Be alert for signs of infection including redness, streaking, odor, fever or chills. Be alert for excessive pain or bleeding and notify your surgeon immediately.  WOUND INSTRUCTIONS:   Leave your dressing, cast, or splint in place until your post operative visit.  Keep it clean and dry.  Always keep the incision clean and dry until the staples/sutures are removed. If there is no drainage from the incision you should keep it open to air. If there is drainage from the incision you must keep it covered at all times until the drainage stops  Do not soak in a bath tub, hot tub, pool, lake or other body of water until 21 days after your surgery and your incision is completely dry and healed.  If you have removable sutures (or staples) they must be removed 10-14 days (unless otherwise  instructed) from the day of your surgery.     1)  Elevate the extremity as much as possible.  2)  Keep the dressing clean and dry.  3)  Please call us if the dressing becomes wet or dirty.  4)  If you are experiencing worsening pain or worsening swelling, please call.     MEDICATIONS: Resume all previous home medications at the previous prescribed dose and frequency unless otherwise noted Start taking the  pain medications on an as-needed basis as prescribed  Please taper down pain medication over the next week following surgery.  Ideally you should not require a refill of any narcotic pain medication.  Take pain medication with food to minimize nausea. In addition to the prescribed pain medication, you may take over-the-counter pain relievers such as Tylenol.  Do NOT take additional tylenol if your pain medication already has tylenol in it.  Aspirin 325mg daily for four weeks.      FOLLOWUP INSTRUCTIONS: 1. Follow up at the Physical Therapy Clinic 3-4 days following surgery. This appointment should be scheduled unless other arrangements have been made.The Physical Therapy scheduling number is [phone] if an appointment has not already been arranged.  2. Contact Dr. Kemontae Dunklee's office during office hours at [phone] or the practice after hours line at [phone] for non-emergencies. For medical emergencies call 911.   Discharge Location: Home  

## 2023-02-13 NOTE — Discharge Summary (Signed)
Patient ID: Margaret Graham MRN: [id number] DOB/AGE: November 29, 1945 77 y.o.  Admit date: 02/12/2023 Discharge date: 02/13/2023  Admission Diagnoses:  Strain of gluteus medius  Discharge Diagnoses:  Principal Problem:   Strain of gluteus medius Active Problems:   Tear of left gluteus medius tendon   Past Medical History:  Diagnosis Date   Arthritis    GERD (gastroesophageal reflux disease)    Heart murmur    Hyperlipidemia    Hypothyroidism    Internal hemorrhoid    Schatzki's ring    Thyroid disease     Surgeries: Procedure(s): LEFT GLUTEUS MEDIUS REPAIR WITH POSSIBLE COLLAGEN PATCH PLACEMENT on 02/12/2023   Consultants (if any):   Discharged Condition: Improved  Hospital Course: Margaret Graham is an 77 y.o. female who was admitted 02/12/2023 with a diagnosis of Strain of gluteus medius and went to the operating room on 02/12/2023 and underwent the above named procedures.    She was given perioperative antibiotics:  Anti-infectives (From admission, onward)    Start     Dose/Rate Route Frequency Ordered Stop   02/12/23 0715  ceFAZolin (ANCEF) IVPB 2g/100 mL premix        2 g 200 mL/hr over 30 Minutes Intravenous On call to O.R. 02/12/23 4098 02/12/23 1025     .  She was given sequential compression devices, early ambulation, and appropriate chemoprophylaxis for DVT prophylaxis.  She benefited maximally from the hospital stay and there were no complications.    Recent vital signs:  Vitals:   02/13/23 0500 02/13/23 0530  BP:    Pulse: 62 79  Resp:    Temp:    SpO2: 94% 97%    Recent laboratory studies:  Lab Results  Component Value Date   HGB 13.9 05/24/2021   HGB 13.4 09/27/2020   HGB 14.0 11/07/2017   Lab Results  Component Value Date   WBC 10.9 (H) 05/24/2021   PLT 370 05/24/2021   No results found for: "INR" Lab Results  Component Value Date   NA 137 05/24/2021   K 3.9 05/24/2021   CL 102 05/24/2021   CO2 26 05/24/2021    BUN 22 05/24/2021   CREATININE 0.79 05/24/2021   GLUCOSE 85 05/24/2021    Discharge Medications:   Allergies as of 02/13/2023       Reactions   Declomycin [demeclocycline]         Medication List     TAKE these medications    alendronate 70 MG tablet Commonly known as: FOSAMAX Take 70 mg by mouth once a week. Take with a full glass of water on an empty stomach.   aspirin EC 325 MG tablet Take 1 tablet (325 mg total) by mouth daily. What changed: Another medication with the same name was removed. Continue taking this medication, and follow the directions you see here.   CALCIUM 600 + D PO Take by mouth.   docusate sodium 100 MG capsule Commonly known as: COLACE Take 200 mg by mouth at bedtime.   ezetimibe 10 MG tablet Commonly known as: ZETIA Take 10 mg by mouth daily.   famotidine 40 MG tablet Commonly known as: PEPCID TAKE 1 TABLET(40 MG) BY MOUTH DAILY What changed: See the new instructions.   fluticasone 50 MCG/ACT nasal spray Commonly known as: FLONASE Place 2 sprays into both nostrils in the morning.   levothyroxine 100 MCG tablet Commonly known as: SYNTHROID Take 1 tablet (100 mcg total) by mouth every morning.   MAGNESIUM PO  Take by mouth at bedtime.   multivitamin capsule Take 1 capsule by mouth daily.   ondansetron 8 MG disintegrating tablet Commonly known as: ZOFRAN-ODT Take 1 tablet (8 mg total) by mouth every 8 (eight) hours as needed for nausea or vomiting.   oxyCODONE 5 MG immediate release tablet Commonly known as: Oxy IR/ROXICODONE Take 1 tablet (5 mg total) by mouth every 4 (four) hours as needed (severe pain).   PreserVision AREDS 2 Caps Take 1 tablet by mouth 2 (two) times daily.   REFRESH PLUS OP Place 1 drop into both eyes daily. PF   simvastatin 20 MG tablet Commonly known as: ZOCOR Take 1 tablet (20 mg total) by mouth daily.   vitamin C 1000 MG tablet Take 1,000 mg by mouth daily.   Vitamin D3 1000 units Caps Take  1,000 Units by mouth daily.   zinc gluconate 50 MG tablet Take 50 mg by mouth daily.        Diagnostic Studies: No results found.  Disposition: Discharge disposition: 01-Home or Self Care          Follow-up Information     Huel Cote, MD Follow up.   Specialty: Orthopedic Surgery Contact information: [address] [place] [address] [phone]                  Signed: Huel Cote 02/13/2023, 7:36 AM

## 2023-02-13 NOTE — Progress Notes (Signed)
   Subjective:  Patient reports pain as mild and well controlled. Voiding. Tolerating diet. Was able to mobilize with nursing.  Objective:   VITALS:   Vitals:   02/13/23 0300 02/13/23 0400 02/13/23 0500 02/13/23 0530  BP:      Pulse: 72 88 62 79  Resp:      Temp:      TempSrc:      SpO2: 94% 97% 94% 97%  Weight:      Height:       Left hip dressing is CDI. Fires left hip flexors as well as TA/GS/EHL. 2+ DP pulse. SILT all distributions of left foot.   Lab Results  Component Value Date   WBC 10.9 (H) 05/24/2021   HGB 13.9 05/24/2021   HCT 43.3 05/24/2021   MCV 90.6 05/24/2021   PLT 370 05/24/2021     Assessment/Plan:  1 Day Post-Op status post left hip gluteus medius repair, doing well.  - DVT ppx - SCDs, ambulation, Aspirin - TDWB operative extremity - Pain control - multimodal pain management, ATC acetaminophen in conjunction with as needed narcotic (oxycodone), although this should be minimized with other modalities  - Plan dc home this AM  Margaret Graham 02/13/2023, 7:34 AM

## 2023-02-14 ENCOUNTER — Encounter (HOSPITAL_BASED_OUTPATIENT_CLINIC_OR_DEPARTMENT_OTHER): Payer: Self-pay | Admitting: Physical Therapy

## 2023-02-14 ENCOUNTER — Encounter (HOSPITAL_BASED_OUTPATIENT_CLINIC_OR_DEPARTMENT_OTHER): Payer: Self-pay | Admitting: Orthopaedic Surgery

## 2023-02-14 ENCOUNTER — Ambulatory Visit (HOSPITAL_BASED_OUTPATIENT_CLINIC_OR_DEPARTMENT_OTHER): Payer: Medicare Other | Attending: Orthopaedic Surgery | Admitting: Physical Therapy

## 2023-02-14 ENCOUNTER — Other Ambulatory Visit: Payer: Self-pay

## 2023-02-14 DIAGNOSIS — R262 Difficulty in walking, not elsewhere classified: Secondary | ICD-10-CM

## 2023-02-14 DIAGNOSIS — S76012A Strain of muscle, fascia and tendon of left hip, initial encounter: Secondary | ICD-10-CM | POA: Diagnosis not present

## 2023-02-14 DIAGNOSIS — M6281 Muscle weakness (generalized): Secondary | ICD-10-CM | POA: Diagnosis not present

## 2023-02-14 DIAGNOSIS — M25552 Pain in left hip: Secondary | ICD-10-CM | POA: Diagnosis not present

## 2023-02-14 NOTE — Therapy (Signed)
OUTPATIENT PHYSICAL THERAPY LOWER EXTREMITY EVALUATION   Patient Name: Margaret Graham MRN: [id number] DOB:1946/09/06, 77 y.o., female Today's Date: 02/14/2023  END OF SESSION:  PT End of Session - 02/14/23 1309     Visit Number 1    Number of Visits 21    Date for PT Re-Evaluation 05/15/23    Authorization Type MCR    PT Start Time 0800    PT Stop Time 0845    PT Time Calculation (min) 45 min    Activity Tolerance Patient tolerated treatment well    Behavior During Therapy WFL for tasks assessed/performed             Past Medical History:  Diagnosis Date   Arthritis    GERD (gastroesophageal reflux disease)    Heart murmur    Hyperlipidemia    Hypothyroidism    Internal hemorrhoid    Schatzki's ring    Thyroid disease    Past Surgical History:  Procedure Laterality Date   ABDOMINAL HYSTERECTOMY     BREAST SURGERY     for dilated duct   GLUTEUS MINIMUS REPAIR Left 02/12/2023   Procedure: LEFT GLUTEUS MEDIUS REPAIR WITH POSSIBLE COLLAGEN PATCH PLACEMENT;  Surgeon: Huel Cote, MD;  Location: [facility];  Service: Orthopedics;  Laterality: Left;   MYRINGOTOMY WITH TUBE PLACEMENT Left 12/19/2022   Procedure: MYRINGOTOMY WITH TUBE PLACEMENT;  Surgeon: Laren Boom, DO;  Location: [facility];  Service: ENT;  Laterality: Left;   Patient Active Problem List   Diagnosis Date Noted   Tear of left gluteus medius tendon 02/12/2023   Strain of gluteus medius 02/06/2023   Chronic serous otitis media 12/19/2022   Osteopenia 01/23/2016   Dyslipidemia 01/18/2016   History of esophageal stricture 01/18/2016   Postoperative hypothyroidism 01/18/2016   Esophageal reflux 01/18/2016   Snoring 01/18/2016    PCP: Adrian Prince, MD   REFERRING PROVIDER: Huel Cote, MD   REFERRING DIAG:  S76.012A (ICD-10-CM) - Tear of left gluteus medius tendon, initial encounter      THERAPY DIAG:  Pain in left hip  Muscle weakness  (generalized)  Difficulty walking  Rationale for Evaluation and Treatment: Rehabilitation  ONSET DATE: Days since surgery: 2  PROCEDURE: 1. Left hip gluteus medius repair with collagen patch augmentation 2. Left hip trochanteric bursectomy  SUBJECTIVE:   SUBJECTIVE STATEMENT: Pt presents with spouse, [name].   Pt states she has had a long history with the L hip and back pain. Prior to surgery she had injections with some relief. Pt tried PT and TPDN at the time without resolution of pain.   Since surgery, pt has been using her rollator and has been compliant with PWB. Pt states that sitting still for long periods of time irritates the hip and makes it feel sore. Pt has been compliant with aspirin usage. Pt states that after surgery, the pain is very bad in the morning. Getting moving is hard. Pt has been using ice. Pt enjoys walking for exercise. Pt would walk about a mile at a time for exercise. Pt denies signs infection.   Pt is currently only 2 days post op.   PERTINENT HISTORY: LBP PAIN:  Are you having pain? Yes: NPRS scale: 0/10, Worst 7/10 Pain location: L hip surgical site  Pain description: aching  Aggravating factors: moving WB Relieving factors: rest,   PRECAUTIONS: Other: Hip Glute Med Repair  WEIGHT BEARING RESTRICTIONS: Yes TTWB  FALLS:  Has patient fallen in last 6 months? No  LIVING ENVIRONMENT: Lives with: lives with their family and lives with their spouse Lives in: House/apartment Stairs: Yes, 2 story Has following equipment at home: Environmental consultant - 4 wheeled and Wheelchair (manual)  OCCUPATION: retired  PLOF: Independent  PATIENT GOALS: Return to exercise and walking w/o pain  NEXT MD VISIT: 2 wk f/u  OBJECTIVE:   DIAGNOSTIC FINDINGS:  IMPRESSION: 1. Moderate tendinosis and probable partial tearing of the left gluteus minimus tendon with associated muscular atrophy. Mild gluteus medius tendinosis, left greater than right. 2. Mild degenerative  changes of both hips and sacroiliac joints. No acute osseous findings. 3. Chronic spondylosis in the visualized lower lumbar spine.    PATIENT SURVEYS:  FOTO 25 57 pts MCII 19 pts MCII   COGNITION: Overall cognitive status: Within functional limits for tasks assessed     SENSATION: WFL  POSTURE: rounded shoulders, increased thoracic kyphosis, and weight shift right  PALPATION: No TTP noted around the incision sight; aquacell bandage in place without signs of infection  LOWER EXTREMITY ROM:  Active ROM Right eval Left eval  Hip flexion WFL 90  Hip extension WFL 0  Hip abduction WFL N/A  Hip adduction WFL N/A  Hip internal rotation WFL N/A  Hip external rotation [facility] N/A  Knee flexion WFL 120  Knee extension WFL -5   (Blank rows = not tested)  LOWER EXTREMITY MMT:  Not indicated at evaluation   FUNCTIONAL TESTS:  FUNCTIONAL TESTS:  5 times sit to stand: single STS performed on single leg on L, UE support needed   GAIT: Distance walked: 65ft Assistive device utilized: able to walk RW Comments:  decreased L stance time, decreased R step length, limited hip extension bilat, step to pattern     TODAY'S TREATMENT:                                                                                                                              DATE: 4/18   Exercises - Seated Long Arc Quad  - 2-3 x daily - 7 x weekly - 3 sets - 10 reps - Seated Gluteal Sets  - 2-3 x daily - 7 x weekly - 3 sets - 10 reps - Seated Heel Raise  - 2-3 x daily - 7 x weekly - 3 sets - 10 reps - Seated Heel Toe Raises  - 2-3 x daily - 7 x weekly - 3 sets - 10 reps - Supine Posterior Pelvic Tilt  - 2-3 x daily - 7 x weekly - 2 sets - 10 reps - 2 hold   PATIENT EDUCATION:  Education details: precautions/protocol, AD usage, edema management, gait, cryotherapy, diagnosis, prognosis, anatomy, exercise progression, DOMS expectations, muscle firing,  envelope of function, HEP, POC   Person educated:  Patient Education method: Explanation, Demonstration, Tactile cues, Verbal cues, and Handouts Education comprehension: verbalized understanding, returned demonstration, verbal cues required, and tactile cues required   HOME EXERCISE PROGRAM:  Access Code: [email or web address] URL:  [email or web address] Date: 02/14/2023 Prepared by: Zebedee Iba  ASSESSMENT:  ASSESSMENT:   CLINICAL IMPRESSION:  Patient is a 77 y.o. female who was seen today for physical therapy evaluation and treatment for s/p L hip glute med repair. Pt's s/s are consistent with expected level of function post surgery. Per MD note, pt is TTWB at this time.   Pt gave verbal understanding to surgical precautions and printout of protocol provided. Pt's pain appears well managed during session. No s/s of infection noted. Patient will benefit from skilled PT to address above impairments and improve overall function.     OBJECTIVE IMPAIRMENTS Abnormal gait, decreased activity tolerance, decreased endurance, decreased mobility, difficulty walking, decreased ROM, decreased strength, hypomobility, increased muscle spasms, impaired flexibility, improper body mechanics, postural dysfunction, and pain.    ACTIVITY LIMITATIONS carrying, lifting, bending, sitting, standing, squatting, sleeping, stairs, transfers, and locomotion level   PARTICIPATION LIMITATIONS: cleaning, laundry, interpersonal relationship, driving, shopping, community activity, occupation, and exercise   PERSONAL FACTORS Age, Fitness, Time since onset of injury/illness/exacerbation, and 3+ comorbidities:  are also affecting patient's functional outcome.    REHAB POTENTIAL: Fair     CLINICAL DECISION MAKING: Stable/uncomplicated   EVALUATION COMPLEXITY: Low     GOALS:     SHORT TERM GOALS: Target date: 03/28/2023        Pt will become independent with HEP in order to demonstrate synthesis of PT education.     Goal status: INITIAL   2.  Pt will be able  to demonstrate full PROM on L hip in order to demonstrate functional improvement in LE function for progression to next phase of rehab.     Goal status: INITIAL   3.  Pt will score at least 19 pt increase on FOTO to demonstrate functional improvement in MCII and pt perceived function.      Goal status: INITIAL     LONG TERM GOALS: Target date: 05/09/2023       Pt  will become independent with final HEP in order to demonstrate synthesis of PT education.    Goal status: INITIAL   2.  Pt will be able to demonstrate full AROM of the L hip in all planes in order to demonstrate functional improvement in LE function for self-care and house hold duties.   Goal status: INITIAL    3  Pt will score >/= 57 on FOTO to demonstrate improvement in perceived L hip function.             INITIAL   4.  Pt will be able to demonstrate ability to squat to parallel without pain in order to demonstrate functional improvement in LE function for self-care and house hold duties.    Goal status: INITIAL   5.  Pt will be able to demonstrate/report ability to walk >30 mins without pain in order to demonstrate functional improvement and tolerance to exercise and community mobility.    Goal status: INITIAL     PLAN: PT FREQUENCY: 1-2x/week   PT DURATION: 12 weeks   PLANNED INTERVENTIONS: Therapeutic exercises, Therapeutic activity, Neuromuscular re-education, Balance training, Gait training, Patient/Family education, Joint manipulation, Joint mobilization, Stair training, Orthotic/Fit training, DME instructions, Aquatic Therapy, Dry Needling, Electrical stimulation, Spinal manipulation, Spinal mobilization, Cryotherapy, Moist heat, scar mobilization, Splintting, Taping, Vasopneumatic device, Traction, Ultrasound, Ionotophoresis /ml Dexamethasone, Manual therapy, and Re-evaluation.    Zebedee Iba, PT 02/14/2023, 1:28 PM

## 2023-02-18 ENCOUNTER — Ambulatory Visit (HOSPITAL_BASED_OUTPATIENT_CLINIC_OR_DEPARTMENT_OTHER): Payer: Medicare Other | Admitting: Physical Therapy

## 2023-02-18 ENCOUNTER — Encounter (HOSPITAL_BASED_OUTPATIENT_CLINIC_OR_DEPARTMENT_OTHER): Payer: Self-pay | Admitting: Physical Therapy

## 2023-02-18 DIAGNOSIS — M6281 Muscle weakness (generalized): Secondary | ICD-10-CM

## 2023-02-18 DIAGNOSIS — M25552 Pain in left hip: Secondary | ICD-10-CM | POA: Diagnosis not present

## 2023-02-18 DIAGNOSIS — S76012A Strain of muscle, fascia and tendon of left hip, initial encounter: Secondary | ICD-10-CM | POA: Diagnosis not present

## 2023-02-18 DIAGNOSIS — R262 Difficulty in walking, not elsewhere classified: Secondary | ICD-10-CM

## 2023-02-18 NOTE — Therapy (Signed)
OUTPATIENT PHYSICAL THERAPY LOWER EXTREMITY EVALUATION   Patient Name: Margaret Graham MRN: [id number] DOB:Dec 14, 1945, 77 y.o., female Today's Date: 02/18/2023  END OF SESSION:  PT End of Session - 02/18/23 0800     Visit Number 2    Number of Visits 21    Date for PT Re-Evaluation 05/15/23    Authorization Type MCR progress note at 10             Past Medical History:  Diagnosis Date   Arthritis    GERD (gastroesophageal reflux disease)    Heart murmur    Hyperlipidemia    Hypothyroidism    Internal hemorrhoid    Schatzki's ring    Thyroid disease    Past Surgical History:  Procedure Laterality Date   ABDOMINAL HYSTERECTOMY     BREAST SURGERY     for dilated duct   GLUTEUS MINIMUS REPAIR Left 02/12/2023   Procedure: LEFT GLUTEUS MEDIUS REPAIR WITH POSSIBLE COLLAGEN PATCH PLACEMENT;  Surgeon: Huel Cote, MD;  Location: [facility];  Service: Orthopedics;  Laterality: Left;   MYRINGOTOMY WITH TUBE PLACEMENT Left 12/19/2022   Procedure: MYRINGOTOMY WITH TUBE PLACEMENT;  Surgeon: Laren Boom, DO;  Location: [facility];  Service: ENT;  Laterality: Left;   Patient Active Problem List   Diagnosis Date Noted   Tear of left gluteus medius tendon 02/12/2023   Strain of gluteus medius 02/06/2023   Chronic serous otitis media 12/19/2022   Osteopenia 01/23/2016   Dyslipidemia 01/18/2016   History of esophageal stricture 01/18/2016   Postoperative hypothyroidism 01/18/2016   Esophageal reflux 01/18/2016   Snoring 01/18/2016    PCP: Adrian Prince, MD   REFERRING PROVIDER: Huel Cote, MD   REFERRING DIAG:  S76.012A (ICD-10-CM) - Tear of left gluteus medius tendon, initial encounter      THERAPY DIAG:  No diagnosis found.  Rationale for Evaluation and Treatment: Rehabilitation  ONSET DATE: Days since surgery: 6  PROCEDURE: 1. Left hip gluteus medius repair with collagen patch augmentation 2. Left hip trochanteric  bursectomy  SUBJECTIVE:   SUBJECTIVE STATEMENT:  Patient reports a 1/10 pain at entry with some aching in her left quad. Patient reports " I am feeling good", reports following her HEP.    Pt presents with spouse, [name].   Pt states she has had a long history with the L hip and back pain. Prior to surgery she had injections with some relief. Pt tried PT and TPDN at the time without resolution of pain.   Since surgery, pt has been using her rollator and has been compliant with PWB. Pt states that sitting still for long periods of time irritates the hip and makes it feel sore. Pt has been compliant with aspirin usage. Pt states that after surgery, the pain is very bad in the morning. Getting moving is hard. Pt has been using ice. Pt enjoys walking for exercise. Pt would walk about a mile at a time for exercise. Pt denies signs infection.   Pt is currently only 2 days post op.   PERTINENT HISTORY: LBP PAIN:  Are you having pain? Yes: NPRS scale: 0/10, Worst 7/10 Pain location: L hip surgical site  Pain description: aching  Aggravating factors: moving WB Relieving factors: rest,   PRECAUTIONS: Other: Hip Glute Med Repair  WEIGHT BEARING RESTRICTIONS: Yes TTWB  FALLS:  Has patient fallen in last 6 months? No  LIVING ENVIRONMENT: Lives with: lives with their family and lives with their spouse Lives in: House/apartment  Stairs: Yes, 2 story Has following equipment at home: Dan Humphreys - 4 wheeled and Wheelchair (manual)  OCCUPATION: retired  PLOF: Independent  PATIENT GOALS: Return to exercise and walking w/o pain  NEXT MD VISIT: 2 wk f/u  OBJECTIVE:   DIAGNOSTIC FINDINGS:  IMPRESSION: 1. Moderate tendinosis and probable partial tearing of the left gluteus minimus tendon with associated muscular atrophy. Mild gluteus medius tendinosis, left greater than right. 2. Mild degenerative changes of both hips and sacroiliac joints. No acute osseous findings. 3. Chronic spondylosis  in the visualized lower lumbar spine.    PATIENT SURVEYS:  FOTO 25 57 pts MCII 19 pts MCII   COGNITION: Overall cognitive status: Within functional limits for tasks assessed     SENSATION: WFL  POSTURE: rounded shoulders, increased thoracic kyphosis, and weight shift right  PALPATION: No TTP noted around the incision sight; aquacell bandage in place without signs of infection  LOWER EXTREMITY ROM:  Active ROM Right eval Left eval  Hip flexion WFL 90  Hip extension WFL 0  Hip abduction WFL N/A  Hip adduction WFL N/A  Hip internal rotation WFL N/A  Hip external rotation [facility] N/A  Knee flexion WFL 120  Knee extension WFL -5   (Blank rows = not tested)  LOWER EXTREMITY MMT:  Not indicated at evaluation   FUNCTIONAL TESTS:  FUNCTIONAL TESTS:  5 times sit to stand: single STS performed on single leg on L, UE support needed   GAIT: Distance walked: 70ft Assistive device utilized: able to walk RW Comments:  decreased L stance time, decreased R step length, limited hip extension bilat, step to pattern     TODAY'S TREATMENT:                                                                                                                              DATE:  4/22 Manual: Passive ROM within protocol limits. Patient hi protocol limits quickly  Glut sets/uad set  2x10  PPT 2x10  SAQ 2x10   LAQ 2x10  Seated heel/toe 2x10       4/18     Exercises - Seated Long Arc Quad  - 2-3 x daily - 7 x weekly - 3 sets - 10 reps - Seated Gluteal Sets  - 2-3 x daily - 7 x weekly - 3 sets - 10 reps - Seated Heel Raise  - 2-3 x daily - 7 x weekly - 3 sets - 10 reps - Seated Heel Toe Raises  - 2-3 x daily - 7 x weekly - 3 sets - 10 reps - Supine Posterior Pelvic Tilt  - 2-3 x daily - 7 x weekly - 2 sets - 10 reps - 2 hold   PATIENT EDUCATION:  Education details: precautions/protocol, AD usage, edema management, gait, cryotherapy, diagnosis, prognosis, anatomy, exercise  progression, DOMS expectations, muscle firing,  envelope of function, HEP, POC   Person educated: Patient Education method: Explanation, Demonstration, Tactile cues, Verbal cues,  and Handouts Education comprehension: verbalized understanding, returned demonstration, verbal cues required, and tactile cues required   HOME EXERCISE PROGRAM:  Access Code: [email or web address] URL: [email or web address] Date: 02/14/2023 Prepared by: Zebedee Iba  ASSESSMENT:  ASSESSMENT:   CLINICAL IMPRESSION:  The patient is doing very well. Her ROM is reaching protocol limits. We reviewed basic HEP. We reviewed the exercises given for her HEP. She had no pain. Her exercises were kept consistent. She will see her MD next week. She had some concern about her bandage. She reported some itching but it does not seem to be having a reaction. Patient was advised to see MD if it gets worse. Patient was consistently advised that she is in protection phase.   OBJECTIVE IMPAIRMENTS Abnormal gait, decreased activity tolerance, decreased endurance, decreased mobility, difficulty walking, decreased ROM, decreased strength, hypomobility, increased muscle spasms, impaired flexibility, improper body mechanics, postural dysfunction, and pain.    ACTIVITY LIMITATIONS carrying, lifting, bending, sitting, standing, squatting, sleeping, stairs, transfers, and locomotion level   PARTICIPATION LIMITATIONS: cleaning, laundry, interpersonal relationship, driving, shopping, community activity, occupation, and exercise   PERSONAL FACTORS Age, Fitness, Time since onset of injury/illness/exacerbation, and 3+ comorbidities:  are also affecting patient's functional outcome.    REHAB POTENTIAL: Fair     CLINICAL DECISION MAKING: Stable/uncomplicated   EVALUATION COMPLEXITY: Low     GOALS:     SHORT TERM GOALS: Target date: 03/28/2023        Pt will become independent with HEP in order to demonstrate synthesis of PT  education.     Goal status: INITIAL   2.  Pt will be able to demonstrate full PROM on L hip in order to demonstrate functional improvement in LE function for progression to next phase of rehab.     Goal status: INITIAL   3.  Pt will score at least 19 pt increase on FOTO to demonstrate functional improvement in MCII and pt perceived function.      Goal status: INITIAL     LONG TERM GOALS: Target date: 05/09/2023       Pt  will become independent with final HEP in order to demonstrate synthesis of PT education.    Goal status: INITIAL   2.  Pt will be able to demonstrate full AROM of the L hip in all planes in order to demonstrate functional improvement in LE function for self-care and house hold duties.   Goal status: INITIAL    3  Pt will score >/= 57 on FOTO to demonstrate improvement in perceived L hip function.             INITIAL   4.  Pt will be able to demonstrate ability to squat to parallel without pain in order to demonstrate functional improvement in LE function for self-care and house hold duties.    Goal status: INITIAL   5.  Pt will be able to demonstrate/report ability to walk >30 mins without pain in order to demonstrate functional improvement and tolerance to exercise and community mobility.    Goal status: INITIAL     PLAN: PT FREQUENCY: 1-2x/week   PT DURATION: 12 weeks   PLANNED INTERVENTIONS: Therapeutic exercises, Therapeutic activity, Neuromuscular re-education, Balance training, Gait training, Patient/Family education, Joint manipulation, Joint mobilization, Stair training, Orthotic/Fit training, DME instructions, Aquatic Therapy, Dry Needling, Electrical stimulation, Spinal manipulation, Spinal mobilization, Cryotherapy, Moist heat, scar mobilization, Splintting, Taping, Vasopneumatic device, Traction, Ultrasound, Ionotophoresis /ml Dexamethasone, Manual therapy, and Re-evaluation.    Dessie Coma, PT 02/18/2023,  8:05 AM

## 2023-02-22 ENCOUNTER — Telehealth: Payer: Self-pay

## 2023-02-22 NOTE — Telephone Encounter (Signed)
Pt called and stated her bandage is coming off. I advised she can just tape over the area coming off and to just make sure incision stays dry and clean because we will remove bandage on wed. When she comes in. Pt stated understanding.  She will cb if has any fever or chills

## 2023-02-27 ENCOUNTER — Ambulatory Visit (HOSPITAL_BASED_OUTPATIENT_CLINIC_OR_DEPARTMENT_OTHER): Payer: Medicare Other | Attending: Orthopaedic Surgery | Admitting: Physical Therapy

## 2023-02-27 ENCOUNTER — Encounter (HOSPITAL_BASED_OUTPATIENT_CLINIC_OR_DEPARTMENT_OTHER): Payer: Self-pay | Admitting: Physical Therapy

## 2023-02-27 ENCOUNTER — Ambulatory Visit (INDEPENDENT_AMBULATORY_CARE_PROVIDER_SITE_OTHER): Payer: Medicare Other | Admitting: Orthopaedic Surgery

## 2023-02-27 ENCOUNTER — Encounter (HOSPITAL_BASED_OUTPATIENT_CLINIC_OR_DEPARTMENT_OTHER): Payer: Self-pay | Admitting: Orthopaedic Surgery

## 2023-02-27 DIAGNOSIS — M25552 Pain in left hip: Secondary | ICD-10-CM | POA: Diagnosis not present

## 2023-02-27 DIAGNOSIS — M6281 Muscle weakness (generalized): Secondary | ICD-10-CM

## 2023-02-27 DIAGNOSIS — S76012A Strain of muscle, fascia and tendon of left hip, initial encounter: Secondary | ICD-10-CM

## 2023-02-27 DIAGNOSIS — R262 Difficulty in walking, not elsewhere classified: Secondary | ICD-10-CM | POA: Diagnosis not present

## 2023-02-27 NOTE — Progress Notes (Signed)
Post Operative Evaluation    Procedure/Date of Surgery: Left gluteus medius repair 02/12/23  Interval History:    Presents today 2 weeks status post the above procedure.  Overall she is doing very well.  She has been compliant with nonweightbearing.  She is here today for further discussion.  She been using her walker.  Denies any redness or swelling around the incision   PMH/PSH/Family History/Social History/Meds/Allergies:    Past Medical History:  Diagnosis Date   Arthritis    GERD (gastroesophageal reflux disease)    Heart murmur    Hyperlipidemia    Hypothyroidism    Internal hemorrhoid    Schatzki's ring    Thyroid disease    Past Surgical History:  Procedure Laterality Date   ABDOMINAL HYSTERECTOMY     BREAST SURGERY     for dilated duct   GLUTEUS MINIMUS REPAIR Left 02/12/2023   Procedure: LEFT GLUTEUS MEDIUS REPAIR WITH POSSIBLE COLLAGEN PATCH PLACEMENT;  Surgeon: Huel Cote, MD;  Location: [facility];  Service: Orthopedics;  Laterality: Left;   MYRINGOTOMY WITH TUBE PLACEMENT Left 12/19/2022   Procedure: MYRINGOTOMY WITH TUBE PLACEMENT;  Surgeon: Laren Boom, DO;  Location: [facility];  Service: ENT;  Laterality: Left;   Social History   Socioeconomic History   Marital status: Married    Spouse name: Not on file   Number of children: 2   Years of education: Not on file   Highest education level: Not on file  Occupational History   Not on file  Tobacco Use   Smoking status: Never   Smokeless tobacco: Never  Vaping Use   Vaping Use: Never used  Substance and Sexual Activity   Alcohol use: Yes    Alcohol/week: 1.0 standard drink of alcohol    Types: 1 Glasses of wine per week    Comment: 1 glass a day   Drug use: No   Sexual activity: Not Currently    Partners: Male  Other Topics Concern   Not on file  Social History Narrative   Retired from [employer].   Moved to [facility] 07/2015.   From [place].    Social Determinants of Health   Financial Resource Strain: Not on file  Food Insecurity: Not on file  Transportation Needs: Not on file  Physical Activity: Not on file  Stress: Not on file  Social Connections: Not on file   Family History  Problem Relation Age of Onset   Arthritis Mother    Arthritis Father    Hyperlipidemia Father    Diabetes Son    Asthma Son    Heart disease Paternal Uncle    Colon cancer Neg Hx    Esophageal cancer Neg Hx    Rectal cancer Neg Hx    Allergies  Allergen Reactions   Declomycin [Demeclocycline]    Current Outpatient Medications  Medication Sig Dispense Refill   alendronate (FOSAMAX) 70 MG tablet Take 70 mg by mouth once a week. Take with a full glass of water on an empty stomach.     Ascorbic Acid (VITAMIN C) 1000 MG tablet Take 1,000 mg by mouth daily.     aspirin EC 325 MG tablet Take 1 tablet (325 mg total) by mouth daily. 30 tablet 0   Calcium Carb-Cholecalciferol (CALCIUM 600 + D PO) Take by  mouth.     Carboxymethylcellulose Sodium (REFRESH PLUS OP) Place 1 drop into both eyes daily. PF     Cholecalciferol (VITAMIN D3) 25 MCG (1000 UT) capsule Take 1,000 Units by mouth daily.     docusate sodium (COLACE) 100 MG capsule Take 200 mg by mouth at bedtime.     ezetimibe (ZETIA) 10 MG tablet Take 10 mg by mouth daily.     famotidine (PEPCID) 40 MG tablet TAKE 1 TABLET(40 MG) BY MOUTH DAILY (Patient taking differently: at bedtime.) 90 tablet 0   fluticasone (FLONASE) 50 MCG/ACT nasal spray Place 2 sprays into both nostrils in the morning.     levothyroxine (SYNTHROID, LEVOTHROID) 100 MCG tablet Take 1 tablet (100 mcg total) by mouth every morning. 30 tablet 3   MAGNESIUM PO Take by mouth at bedtime.     Multiple Vitamin (MULTIVITAMIN) capsule Take 1 capsule by mouth daily.     Multiple Vitamins-Minerals (PRESERVISION AREDS 2) CAPS Take 1 tablet by mouth 2 (two) times daily.     ondansetron (ZOFRAN-ODT) 8 MG disintegrating tablet Take 1  tablet (8 mg total) by mouth every 8 (eight) hours as needed for nausea or vomiting. 10 tablet 0   oxyCODONE (OXY IR/ROXICODONE) 5 MG immediate release tablet Take 1 tablet (5 mg total) by mouth every 4 (four) hours as needed (severe pain). 20 tablet 0   simvastatin (ZOCOR) 20 MG tablet Take 1 tablet (20 mg total) by mouth daily. 30 tablet 0   zinc gluconate 50 MG tablet Take 50 mg by mouth daily.     Current Facility-Administered Medications  Medication Dose Route Frequency Provider Last Rate Last Admin   0.9 %  sodium chloride infusion  500 mL Intravenous Once Charlie Pitter III, MD       No results found.  Review of Systems:   A ROS was performed including pertinent positives and negatives as documented in the HPI.   Musculoskeletal Exam:    There were no vitals taken for this visit.  Incision is well-appearing without erythema or drainage.  Able to actively flex left hip to 90 degrees.  30 degrees internal/external rotation of the left hip.  Remainder of distal neurosensory exam is intact  Imaging:      I personally reviewed and interpreted the radiographs.   Assessment:   2 weeks status post left hip gluteus medius repair overall doing well.  At this time she will continue to advance according to the gluteus protocol.  I will plan to see her back in 4 weeks for reassessment.  She will advance her weightbearing to as tolerated at this time.  Plan :    -Return to clinic in 4 weeks for reassessment      I personally saw and evaluated the patient, and participated in the management and treatment plan.  Huel Cote, MD Attending Physician, Orthopedic Surgery  This document was dictated using Dragon voice recognition software. A reasonable attempt at proof reading has been made to minimize errors.

## 2023-02-27 NOTE — Therapy (Signed)
OUTPATIENT PHYSICAL THERAPY LOWER EXTREMITY EVALUATION   Patient Name: Margaret Graham MRN: [id number] DOB:Feb 28, 1946, 77 y.o., female Today's Date: 02/27/2023  END OF SESSION:  PT End of Session - 02/27/23 1254     Visit Number 3    Number of Visits 21    Date for PT Re-Evaluation 05/15/23    Authorization Type MCR progress note at 10    PT Start Time 1145    PT Stop Time 1223    PT Time Calculation (min) 38 min    Activity Tolerance Patient tolerated treatment well    Behavior During Therapy WFL for tasks assessed/performed             Past Medical History:  Diagnosis Date   Arthritis    GERD (gastroesophageal reflux disease)    Heart murmur    Hyperlipidemia    Hypothyroidism    Internal hemorrhoid    Schatzki's ring    Thyroid disease    Past Surgical History:  Procedure Laterality Date   ABDOMINAL HYSTERECTOMY     BREAST SURGERY     for dilated duct   GLUTEUS MINIMUS REPAIR Left 02/12/2023   Procedure: LEFT GLUTEUS MEDIUS REPAIR WITH POSSIBLE COLLAGEN PATCH PLACEMENT;  Surgeon: Huel Cote, MD;  Location: [facility];  Service: Orthopedics;  Laterality: Left;   MYRINGOTOMY WITH TUBE PLACEMENT Left 12/19/2022   Procedure: MYRINGOTOMY WITH TUBE PLACEMENT;  Surgeon: Laren Boom, DO;  Location: [facility];  Service: ENT;  Laterality: Left;   Patient Active Problem List   Diagnosis Date Noted   Tear of left gluteus medius tendon 02/12/2023   Strain of gluteus medius 02/06/2023   Chronic serous otitis media 12/19/2022   Osteopenia 01/23/2016   Dyslipidemia 01/18/2016   History of esophageal stricture 01/18/2016   Postoperative hypothyroidism 01/18/2016   Esophageal reflux 01/18/2016   Snoring 01/18/2016    PCP: Adrian Prince, MD   REFERRING PROVIDER: Huel Cote, MD   REFERRING DIAG:  S76.012A (ICD-10-CM) - Tear of left gluteus medius tendon, initial encounter      THERAPY DIAG:  Pain in left hip  Muscle weakness  (generalized)  Difficulty walking  Rationale for Evaluation and Treatment: Rehabilitation  ONSET DATE: Days since surgery: 15  PROCEDURE: 1. Left hip gluteus medius repair with collagen patch augmentation 2. Left hip trochanteric bursectomy  SUBJECTIVE:   SUBJECTIVE STATEMENT:  Patient reports a 1/10 pain at entry with some aching in her left quad. Patient reports " I am feeling good", reports following her HEP.    Pt presents with spouse, [name].   Pt states she has had a long history with the L hip and back pain. Prior to surgery she had injections with some relief. Pt tried PT and TPDN at the time without resolution of pain.   Since surgery, pt has been using her rollator and has been compliant with PWB. Pt states that sitting still for long periods of time irritates the hip and makes it feel sore. Pt has been compliant with aspirin usage. Pt states that after surgery, the pain is very bad in the morning. Getting moving is hard. Pt has been using ice. Pt enjoys walking for exercise. Pt would walk about a mile at a time for exercise. Pt denies signs infection.   Pt is currently only 2 days post op.   PERTINENT HISTORY: LBP PAIN:  Are you having pain? Yes: NPRS scale: 0/10, Worst 7/10 Pain location: L hip surgical site  Pain description: aching  Aggravating factors: moving WB Relieving factors: rest,   PRECAUTIONS: Other: Hip Glute Med Repair  WEIGHT BEARING RESTRICTIONS: Yes TTWB  FALLS:  Has patient fallen in last 6 months? No  LIVING ENVIRONMENT: Lives with: lives with their family and lives with their spouse Lives in: House/apartment Stairs: Yes, 2 story Has following equipment at home: Environmental consultant - 4 wheeled and Wheelchair (manual)  OCCUPATION: retired  PLOF: Independent  PATIENT GOALS: Return to exercise and walking w/o pain  NEXT MD VISIT: 2 wk f/u  OBJECTIVE:   DIAGNOSTIC FINDINGS:  IMPRESSION: 1. Moderate tendinosis and probable partial tearing of the  left gluteus minimus tendon with associated muscular atrophy. Mild gluteus medius tendinosis, left greater than right. 2. Mild degenerative changes of both hips and sacroiliac joints. No acute osseous findings. 3. Chronic spondylosis in the visualized lower lumbar spine.    PATIENT SURVEYS:  FOTO 25 57 pts MCII 19 pts MCII   COGNITION: Overall cognitive status: Within functional limits for tasks assessed     SENSATION: WFL  POSTURE: rounded shoulders, increased thoracic kyphosis, and weight shift right  PALPATION: No TTP noted around the incision sight; aquacell bandage in place without signs of infection  LOWER EXTREMITY ROM:  Active ROM Right eval Left eval  Hip flexion WFL 90  Hip extension WFL 0  Hip abduction WFL N/A  Hip adduction WFL N/A  Hip internal rotation WFL N/A  Hip external rotation [facility] N/A  Knee flexion WFL 120  Knee extension WFL -5   (Blank rows = not tested)  LOWER EXTREMITY MMT:  Not indicated at evaluation   FUNCTIONAL TESTS:  FUNCTIONAL TESTS:  5 times sit to stand: single STS performed on single leg on L, UE support needed   GAIT: Distance walked: 6ft Assistive device utilized: able to walk RW Comments:  decreased L stance time, decreased R step length, limited hip extension bilat, step to pattern     TODAY'S TREATMENT:                                                                                                                              DATE:  5/1 Manual: Passive ROM within protocol limits. Patient hi protocol limits quickly  Glut sets/uad set  2x10  PPT 2x10  SAQ 2x10   LAQ 2x10  Seated heel/toe 2x10    4/22 Manual: Passive ROM within protocol limits. Patient hi protocol limits quickly  Glut sets/uad set  2x10  PPT 2x10  SAQ 2x10   LAQ 2x10  Seated heel/toe 2x10       4/18     Exercises - Seated Long Arc Quad  - 2-3 x daily - 7 x weekly - 3 sets - 10 reps - Seated Gluteal Sets  - 2-3 x daily - 7 x  weekly - 3 sets - 10 reps - Seated Heel Raise  - 2-3 x daily - 7 x weekly - 3 sets - 10 reps -  Seated Heel Toe Raises  - 2-3 x daily - 7 x weekly - 3 sets - 10 reps - Supine Posterior Pelvic Tilt  - 2-3 x daily - 7 x weekly - 2 sets - 10 reps - 2 hold   PATIENT EDUCATION:  Education details: precautions/protocol, AD usage, edema management, gait, cryotherapy, diagnosis, prognosis, anatomy, exercise progression, DOMS expectations, muscle firing,  envelope of function, HEP, POC   Person educated: Patient Education method: Explanation, Demonstration, Tactile cues, Verbal cues, and Handouts Education comprehension: verbalized understanding, returned demonstration, verbal cues required, and tactile cues required   HOME EXERCISE PROGRAM:  Access Code: [email or web address] URL: [email or web address] Date: 02/14/2023 Prepared by: Zebedee Iba  ASSESSMENT:  ASSESSMENT:   CLINICAL IMPRESSION: The patient continues to do very well. We held on PPT today 2nd to her back grinding.  Otherwise she tolerated her exercises well. We will progress per patient's weight bearing progression given by the MD. Her ROM was at protocol limits.    OBJECTIVE IMPAIRMENTS Abnormal gait, decreased activity tolerance, decreased endurance, decreased mobility, difficulty walking, decreased ROM, decreased strength, hypomobility, increased muscle spasms, impaired flexibility, improper body mechanics, postural dysfunction, and pain.    ACTIVITY LIMITATIONS carrying, lifting, bending, sitting, standing, squatting, sleeping, stairs, transfers, and locomotion level   PARTICIPATION LIMITATIONS: cleaning, laundry, interpersonal relationship, driving, shopping, community activity, occupation, and exercise   PERSONAL FACTORS Age, Fitness, Time since onset of injury/illness/exacerbation, and 3+ comorbidities:  are also affecting patient's functional outcome.    REHAB POTENTIAL: Fair     CLINICAL DECISION MAKING:  Stable/uncomplicated   EVALUATION COMPLEXITY: Low     GOALS:     SHORT TERM GOALS: Target date: 03/28/2023        Pt will become independent with HEP in order to demonstrate synthesis of PT education.     Goal status: INITIAL   2.  Pt will be able to demonstrate full PROM on L hip in order to demonstrate functional improvement in LE function for progression to next phase of rehab.     Goal status: INITIAL   3.  Pt will score at least 19 pt increase on FOTO to demonstrate functional improvement in MCII and pt perceived function.      Goal status: INITIAL     LONG TERM GOALS: Target date: 05/09/2023       Pt  will become independent with final HEP in order to demonstrate synthesis of PT education.    Goal status: INITIAL   2.  Pt will be able to demonstrate full AROM of the L hip in all planes in order to demonstrate functional improvement in LE function for self-care and house hold duties.   Goal status: INITIAL    3  Pt will score >/= 57 on FOTO to demonstrate improvement in perceived L hip function.             INITIAL   4.  Pt will be able to demonstrate ability to squat to parallel without pain in order to demonstrate functional improvement in LE function for self-care and house hold duties.    Goal status: INITIAL   5.  Pt will be able to demonstrate/report ability to walk >30 mins without pain in order to demonstrate functional improvement and tolerance to exercise and community mobility.    Goal status: INITIAL     PLAN: PT FREQUENCY: 1-2x/week   PT DURATION: 12 weeks   PLANNED INTERVENTIONS: Therapeutic exercises, Therapeutic activity, Neuromuscular re-education, Balance training, Gait training, Patient/Family education, Joint  manipulation, Joint mobilization, Stair training, Orthotic/Fit training, DME instructions, Aquatic Therapy, Dry Needling, Electrical stimulation, Spinal manipulation, Spinal mobilization, Cryotherapy, Moist heat, scar  mobilization, Splintting, Taping, Vasopneumatic device, Traction, Ultrasound, Ionotophoresis 4mg /ml Dexamethasone, Manual therapy, and Re-evaluation.    Dessie Coma, PT 02/27/2023, 1:28 PM

## 2023-03-07 ENCOUNTER — Encounter (HOSPITAL_BASED_OUTPATIENT_CLINIC_OR_DEPARTMENT_OTHER): Payer: Self-pay | Admitting: Physical Therapy

## 2023-03-07 ENCOUNTER — Ambulatory Visit (HOSPITAL_BASED_OUTPATIENT_CLINIC_OR_DEPARTMENT_OTHER): Payer: Medicare Other | Admitting: Physical Therapy

## 2023-03-07 DIAGNOSIS — M6281 Muscle weakness (generalized): Secondary | ICD-10-CM

## 2023-03-07 DIAGNOSIS — R262 Difficulty in walking, not elsewhere classified: Secondary | ICD-10-CM | POA: Diagnosis not present

## 2023-03-07 DIAGNOSIS — M25552 Pain in left hip: Secondary | ICD-10-CM

## 2023-03-07 NOTE — Therapy (Signed)
OUTPATIENT PHYSICAL THERAPY LOWER EXTREMITY EVALUATION   Patient Name: Margaret Graham MRN: [id number] DOB:Sep 26, 1946, 77 y.o., female Today's Date: 02/27/2023  END OF SESSION:  PT End of Session - 02/27/23 1254     Visit Number 3    Number of Visits 21    Date for PT Re-Evaluation 05/15/23    Authorization Type MCR progress note at 10    PT Start Time 1145    PT Stop Time 1223    PT Time Calculation (min) 38 min    Activity Tolerance Patient tolerated treatment well    Behavior During Therapy WFL for tasks assessed/performed             Past Medical History:  Diagnosis Date   Arthritis    GERD (gastroesophageal reflux disease)    Heart murmur    Hyperlipidemia    Hypothyroidism    Internal hemorrhoid    Schatzki's ring    Thyroid disease    Past Surgical History:  Procedure Laterality Date   ABDOMINAL HYSTERECTOMY     BREAST SURGERY     for dilated duct   GLUTEUS MINIMUS REPAIR Left 02/12/2023   Procedure: LEFT GLUTEUS MEDIUS REPAIR WITH POSSIBLE COLLAGEN PATCH PLACEMENT;  Surgeon: Huel Cote, MD;  Location: [facility];  Service: Orthopedics;  Laterality: Left;   MYRINGOTOMY WITH TUBE PLACEMENT Left 12/19/2022   Procedure: MYRINGOTOMY WITH TUBE PLACEMENT;  Surgeon: Laren Boom, DO;  Location: [facility];  Service: ENT;  Laterality: Left;   Patient Active Problem List   Diagnosis Date Noted   Tear of left gluteus medius tendon 02/12/2023   Strain of gluteus medius 02/06/2023   Chronic serous otitis media 12/19/2022   Osteopenia 01/23/2016   Dyslipidemia 01/18/2016   History of esophageal stricture 01/18/2016   Postoperative hypothyroidism 01/18/2016   Esophageal reflux 01/18/2016   Snoring 01/18/2016   Days since surgery: 23  PCP: Adrian Prince, MD   REFERRING PROVIDER: Huel Cote, MD   REFERRING DIAG:  S76.012A (ICD-10-CM) - Tear of left gluteus medius tendon, initial encounter      THERAPY DIAG:  Pain in left  hip  Muscle weakness (generalized)  Difficulty walking  Rationale for Evaluation and Treatment: Rehabilitation  ONSET DATE: Days since surgery: 15  PROCEDURE: 1. Left hip gluteus medius repair with collagen patch augmentation 2. Left hip trochanteric bursectomy  SUBJECTIVE:   SUBJECTIVE STATEMENT:  Patient has been cleared for ambulation by the MD. Se is back to driving per MD. She has been walking with a cane.     Eval: Pt presents with spouse, [name].   Pt states she has had a long history with the L hip and back pain. Prior to surgery she had injections with some relief. Pt tried PT and TPDN at the time without resolution of pain.   Since surgery, pt has been using her rollator and has been compliant with PWB. Pt states that sitting still for long periods of time irritates the hip and makes it feel sore. Pt has been compliant with aspirin usage. Pt states that after surgery, the pain is very bad in the morning. Getting moving is hard. Pt has been using ice. Pt enjoys walking for exercise. Pt would walk about a mile at a time for exercise. Pt denies signs infection.   Pt is currently only 2 days post op.   PERTINENT HISTORY: LBP PAIN:  Are you having pain? Yes: NPRS scale: 0/10, Worst 7/10 Pain location: L hip surgical site  Pain description: aching  Aggravating factors: moving WB Relieving factors: rest,   PRECAUTIONS: Other: Hip Glute Med Repair  WEIGHT BEARING RESTRICTIONS: Yes TTWB  FALLS:  Has patient fallen in last 6 months? No  LIVING ENVIRONMENT: Lives with: lives with their family and lives with their spouse Lives in: House/apartment Stairs: Yes, 2 story Has following equipment at home: Environmental consultant - 4 wheeled and Wheelchair (manual)  OCCUPATION: retired  PLOF: Independent  PATIENT GOALS: Return to exercise and walking w/o pain  NEXT MD VISIT: 2 wk f/u  OBJECTIVE:   DIAGNOSTIC FINDINGS:  IMPRESSION: 1. Moderate tendinosis and probable partial  tearing of the left gluteus minimus tendon with associated muscular atrophy. Mild gluteus medius tendinosis, left greater than right. 2. Mild degenerative changes of both hips and sacroiliac joints. No acute osseous findings. 3. Chronic spondylosis in the visualized lower lumbar spine.    PATIENT SURVEYS:  FOTO 25 57 pts MCII 19 pts MCII   COGNITION: Overall cognitive status: Within functional limits for tasks assessed     SENSATION: WFL  POSTURE: rounded shoulders, increased thoracic kyphosis, and weight shift right  PALPATION: No TTP noted around the incision sight; aquacell bandage in place without signs of infection  LOWER EXTREMITY ROM:  Active ROM Right eval Left eval  Hip flexion WFL 90  Hip extension WFL 0  Hip abduction WFL N/A  Hip adduction WFL N/A  Hip internal rotation WFL N/A  Hip external rotation [facility] N/A  Knee flexion WFL 120  Knee extension WFL -5   (Blank rows = not tested)  LOWER EXTREMITY MMT:  Not indicated at evaluation   FUNCTIONAL TESTS:  FUNCTIONAL TESTS:  5 times sit to stand: single STS performed on single leg on L, UE support needed   GAIT: Distance walked: 26ft Assistive device utilized: able to walk RW Comments:  decreased L stance time, decreased R step length, limited hip extension bilat, step to pattern     TODAY'S TREATMENT:                                                                                                                              DATE:  5/9 Glut sets/quad set  2x10  PPT 2x10  SAQ 2x15 2 lbs  Hip adduction iso 2x15   LAQ 2x10   Standing heel/toe x15  Standing right leg low march 2x8     5/1 Manual: Passive ROM within protocol limits. Patient hi protocol limits quickly  Glut sets/uad set  2x10  PPT 2x10  SAQ 2x10   LAQ 2x10  Seated heel/toe 2x10    4/22 Manual: Passive ROM within protocol limits. Patient hi protocol limits quickly  Glut sets/uad set  2x10  PPT 2x10  SAQ 2x10   LAQ  2x10  Seated heel/toe 2x10       4/18     Exercises - Seated Long Arc Quad  - 2-3 x daily - 7 x weekly - 3  sets - 10 reps - Seated Gluteal Sets  - 2-3 x daily - 7 x weekly - 3 sets - 10 reps - Seated Heel Raise  - 2-3 x daily - 7 x weekly - 3 sets - 10 reps - Seated Heel Toe Raises  - 2-3 x daily - 7 x weekly - 3 sets - 10 reps - Supine Posterior Pelvic Tilt  - 2-3 x daily - 7 x weekly - 2 sets - 10 reps - 2 hold   PATIENT EDUCATION:  Education details: precautions/protocol, AD usage, edema management, gait, cryotherapy, diagnosis, prognosis, anatomy, exercise progression, DOMS expectations, muscle firing,  envelope of function, HEP, POC   Person educated: Patient Education method: Explanation, Demonstration, Tactile cues, Verbal cues, and Handouts Education comprehension: verbalized understanding, returned demonstration, verbal cues required, and tactile cues required   HOME EXERCISE PROGRAM:  Access Code: [email or web address] URL: [email or web address] Date: 02/14/2023 Prepared by: Zebedee Iba  ASSESSMENT:  ASSESSMENT:   CLINICAL IMPRESSION: The patient is making good progress. Her range was at protocol limits without pain. We added in standing weight shifts and a low march. She tolerated well. She has been walking with her cane. We reviewed her tehcnique which was good. She was encouraged to listen to her hip but to walk as much as she is able.   OBJECTIVE IMPAIRMENTS Abnormal gait, decreased activity tolerance, decreased endurance, decreased mobility, difficulty walking, decreased ROM, decreased strength, hypomobility, increased muscle spasms, impaired flexibility, improper body mechanics, postural dysfunction, and pain.    ACTIVITY LIMITATIONS carrying, lifting, bending, sitting, standing, squatting, sleeping, stairs, transfers, and locomotion level   PARTICIPATION LIMITATIONS: cleaning, laundry, interpersonal relationship, driving, shopping, community activity,  occupation, and exercise   PERSONAL FACTORS Age, Fitness, Time since onset of injury/illness/exacerbation, and 3+ comorbidities:  are also affecting patient's functional outcome.    REHAB POTENTIAL: Fair     CLINICAL DECISION MAKING: Stable/uncomplicated   EVALUATION COMPLEXITY: Low     GOALS:     SHORT TERM GOALS: Target date: 03/28/2023        Pt will become independent with HEP in order to demonstrate synthesis of PT education.     Goal status: INITIAL   2.  Pt will be able to demonstrate full PROM on L hip in order to demonstrate functional improvement in LE function for progression to next phase of rehab.     Goal status: INITIAL   3.  Pt will score at least 19 pt increase on FOTO to demonstrate functional improvement in MCII and pt perceived function.      Goal status: INITIAL     LONG TERM GOALS: Target date: 05/09/2023       Pt  will become independent with final HEP in order to demonstrate synthesis of PT education.    Goal status: INITIAL   2.  Pt will be able to demonstrate full AROM of the L hip in all planes in order to demonstrate functional improvement in LE function for self-care and house hold duties.   Goal status: INITIAL    3  Pt will score >/= 57 on FOTO to demonstrate improvement in perceived L hip function.             INITIAL   4.  Pt will be able to demonstrate ability to squat to parallel without pain in order to demonstrate functional improvement in LE function for self-care and house hold duties.    Goal status: INITIAL   5.  Pt will be able to  demonstrate/report ability to walk >30 mins without pain in order to demonstrate functional improvement and tolerance to exercise and community mobility.    Goal status: INITIAL     PLAN: PT FREQUENCY: 1-2x/week   PT DURATION: 12 weeks   PLANNED INTERVENTIONS:  Continue to progress weight bearing and general strengthening per protocol    Dessie Coma, PT 02/27/2023, 1:28 PM

## 2023-03-12 ENCOUNTER — Other Ambulatory Visit: Payer: Self-pay | Admitting: Gastroenterology

## 2023-03-14 ENCOUNTER — Encounter (HOSPITAL_BASED_OUTPATIENT_CLINIC_OR_DEPARTMENT_OTHER): Payer: Self-pay | Admitting: Physical Therapy

## 2023-03-14 ENCOUNTER — Ambulatory Visit (HOSPITAL_BASED_OUTPATIENT_CLINIC_OR_DEPARTMENT_OTHER): Payer: Medicare Other | Admitting: Physical Therapy

## 2023-03-14 DIAGNOSIS — M25552 Pain in left hip: Secondary | ICD-10-CM | POA: Diagnosis not present

## 2023-03-14 DIAGNOSIS — M6281 Muscle weakness (generalized): Secondary | ICD-10-CM | POA: Diagnosis not present

## 2023-03-14 DIAGNOSIS — R262 Difficulty in walking, not elsewhere classified: Secondary | ICD-10-CM | POA: Diagnosis not present

## 2023-03-14 NOTE — Therapy (Signed)
OUTPATIENT PHYSICAL THERAPY LOWER EXTREMITY EVALUATION   Patient Name: Margaret Graham MRN: [id number] DOB:[birth year], 77 y.o., female Today's Date: 03/14/2023  END OF SESSION:    Past Medical History:  Diagnosis Date   Arthritis    GERD (gastroesophageal reflux disease)    Heart murmur    Hyperlipidemia    Hypothyroidism    Internal hemorrhoid    Schatzki's ring    Thyroid disease    Past Surgical History:  Procedure Laterality Date   ABDOMINAL HYSTERECTOMY     BREAST SURGERY     for dilated duct   GLUTEUS MINIMUS REPAIR Left 02/12/2023   Procedure: LEFT GLUTEUS MEDIUS REPAIR WITH POSSIBLE COLLAGEN PATCH PLACEMENT;  Surgeon: Huel Cote, MD;  Location: [facility];  Service: Orthopedics;  Laterality: Left;   MYRINGOTOMY WITH TUBE PLACEMENT Left 12/19/2022   Procedure: MYRINGOTOMY WITH TUBE PLACEMENT;  Surgeon: Laren Boom, DO;  Location: [facility];  Service: ENT;  Laterality: Left;   Patient Active Problem List   Diagnosis Date Noted   Tear of left gluteus medius tendon 02/12/2023   Strain of gluteus medius 02/06/2023   Chronic serous otitis media 12/19/2022   Osteopenia 01/23/2016   Dyslipidemia 01/18/2016   History of esophageal stricture 01/18/2016   Postoperative hypothyroidism 01/18/2016   Esophageal reflux 01/18/2016   Snoring 01/18/2016   Days since surgery: 30  PCP: Adrian Prince, MD   REFERRING PROVIDER: Huel Cote, MD   REFERRING DIAG:  S76.012A (ICD-10-CM) - Tear of left gluteus medius tendon, initial encounter      THERAPY DIAG:  No diagnosis found.  Rationale for Evaluation and Treatment: Rehabilitation  ONSET DATE: Days since surgery: 30  PROCEDURE: 1. Left hip gluteus medius repair with collagen patch augmentation 2. Left hip trochanteric bursectomy  SUBJECTIVE:   SUBJECTIVE STATEMENT:  The patient reports that she has been doing well. She isn't using the cane in the house. She has been working on  her exercises.    Eval: Pt presents with spouse, [name].   Pt states she has had a long history with the L hip and back pain. Prior to surgery she had injections with some relief. Pt tried PT and TPDN at the time without resolution of pain.   Since surgery, pt has been using her rollator and has been compliant with PWB. Pt states that sitting still for long periods of time irritates the hip and makes it feel sore. Pt has been compliant with aspirin usage. Pt states that after surgery, the pain is very bad in the morning. Getting moving is hard. Pt has been using ice. Pt enjoys walking for exercise. Pt would walk about a mile at a time for exercise. Pt denies signs infection.   Pt is currently only 2 days post op.   PERTINENT HISTORY: LBP PAIN:  Are you having pain? Yes: NPRS scale: 0/10, Worst 7/10 Pain location: L hip surgical site  Pain description: aching  Aggravating factors: moving WB Relieving factors: rest,   PRECAUTIONS: Other: Hip Glute Med Repair  WEIGHT BEARING RESTRICTIONS: Yes TTWB  FALLS:  Has patient fallen in last 6 months? No  LIVING ENVIRONMENT: Lives with: lives with their family and lives with their spouse Lives in: House/apartment Stairs: Yes, 2 story Has following equipment at home: Environmental consultant - 4 wheeled and Wheelchair (manual)  OCCUPATION: retired  PLOF: Independent  PATIENT GOALS: Return to exercise and walking w/o pain  NEXT MD VISIT: 2 wk f/u  OBJECTIVE:   DIAGNOSTIC FINDINGS:  IMPRESSION: 1. Moderate tendinosis and probable partial tearing of the left gluteus minimus tendon with associated muscular atrophy. Mild gluteus medius tendinosis, left greater than right. 2. Mild degenerative changes of both hips and sacroiliac joints. No acute osseous findings. 3. Chronic spondylosis in the visualized lower lumbar spine.    PATIENT SURVEYS:  FOTO 25 57 pts MCII 19 pts MCII   COGNITION: Overall cognitive status: Within functional limits for  tasks assessed     SENSATION: WFL  POSTURE: rounded shoulders, increased thoracic kyphosis, and weight shift right  PALPATION: No TTP noted around the incision sight; aquacell bandage in place without signs of infection  LOWER EXTREMITY ROM:  Active ROM Right eval Left eval  Hip flexion WFL 90  Hip extension WFL 0  Hip abduction WFL N/A  Hip adduction WFL N/A  Hip internal rotation WFL N/A  Hip external rotation [facility] N/A  Knee flexion WFL 120  Knee extension WFL -5   (Blank rows = not tested)  LOWER EXTREMITY MMT:  Not indicated at evaluation   FUNCTIONAL TESTS:  FUNCTIONAL TESTS:  5 times sit to stand: single STS performed on single leg on L, UE support needed   GAIT: Distance walked: 62ft Assistive device utilized: able to walk RW Comments:  decreased L stance time, decreased R step length, limited hip extension bilat, step to pattern     TODAY'S TREATMENT:                                                                                                                              DATE:  5/16 Nu-step 5 min  Manual: Passive ROM within protocol limits. Patient him  protocol limits quickly  SAQ used RPE first set x15  REP of 5  2x15 more   Heel raise 2x20   Side stepping 8 steps 5 laps  PPT 2x20          5/9 Glut sets/quad set  2x10  PPT 2x10  SAQ 2x15 2 lbs  Hip adduction iso 2x15   LAQ 2x10   Standing heel/toe x15  Standing right leg low march 2x8   Hip flexion isometric 2x10  Hip adduction 2x10       5/1 Manual: Passive ROM within protocol limits. Patient hi protocol limits quickly  Glut sets/uad set  2x10  PPT 2x10  SAQ 2x10   LAQ 2x10  Seated heel/toe 2x10     PATIENT EDUCATION:  Education details: precautions/protocol, AD usage, edema management, gait, cryotherapy, diagnosis, prognosis, anatomy, exercise progression, DOMS expectations, muscle firing,  envelope of function, HEP, POC   Person educated: Patient Education  method: Explanation, Demonstration, Tactile cues, Verbal cues, and Handouts Education comprehension: verbalized understanding, returned demonstration, verbal cues required, and tactile cues required   HOME EXERCISE PROGRAM:  Access Code: [email or web address] URL: [email or web address] Date: 02/14/2023 Prepared by: Zebedee Iba  ASSESSMENT:  ASSESSMENT:   CLINICAL IMPRESSION: The patient is making good progress. Her range was at  protocol limits without pain. We added in standing weight shifts and a low march. She tolerated well. She has been walking with her cane. We reviewed her tehcnique which was good. She was encouraged to listen to her hip but to walk as much as she is able.   OBJECTIVE IMPAIRMENTS Abnormal gait, decreased activity tolerance, decreased endurance, decreased mobility, difficulty walking, decreased ROM, decreased strength, hypomobility, increased muscle spasms, impaired flexibility, improper body mechanics, postural dysfunction, and pain.    ACTIVITY LIMITATIONS carrying, lifting, bending, sitting, standing, squatting, sleeping, stairs, transfers, and locomotion level   PARTICIPATION LIMITATIONS: cleaning, laundry, interpersonal relationship, driving, shopping, community activity, occupation, and exercise   PERSONAL FACTORS Age, Fitness, Time since onset of injury/illness/exacerbation, and 3+ comorbidities:  are also affecting patient's functional outcome.    REHAB POTENTIAL: Fair     CLINICAL DECISION MAKING: Stable/uncomplicated   EVALUATION COMPLEXITY: Low     GOALS:     SHORT TERM GOALS: Target date: 03/28/2023        Pt will become independent with HEP in order to demonstrate synthesis of PT education.     Goal status: INITIAL   2.  Pt will be able to demonstrate full PROM on L hip in order to demonstrate functional improvement in LE function for progression to next phase of rehab.     Goal status: INITIAL   3.  Pt will score at least 19 pt  increase on FOTO to demonstrate functional improvement in MCII and pt perceived function.      Goal status: INITIAL     LONG TERM GOALS: Target date: 05/09/2023       Pt  will become independent with final HEP in order to demonstrate synthesis of PT education.    Goal status: INITIAL   2.  Pt will be able to demonstrate full AROM of the L hip in all planes in order to demonstrate functional improvement in LE function for self-care and house hold duties.   Goal status: INITIAL    3  Pt will score >/= 57 on FOTO to demonstrate improvement in perceived L hip function.             INITIAL   4.  Pt will be able to demonstrate ability to squat to parallel without pain in order to demonstrate functional improvement in LE function for self-care and house hold duties.    Goal status: INITIAL   5.  Pt will be able to demonstrate/report ability to walk >30 mins without pain in order to demonstrate functional improvement and tolerance to exercise and community mobility.    Goal status: INITIAL     PLAN: PT FREQUENCY: 1-2x/week   PT DURATION: 12 weeks   PLANNED INTERVENTIONS:  Continue to progress weight bearing and general strengthening per protocol    Dessie Coma, PT 03/14/2023, 1:03 PM

## 2023-03-19 ENCOUNTER — Encounter (HOSPITAL_BASED_OUTPATIENT_CLINIC_OR_DEPARTMENT_OTHER): Payer: Self-pay | Admitting: Physical Therapy

## 2023-03-19 ENCOUNTER — Ambulatory Visit (HOSPITAL_BASED_OUTPATIENT_CLINIC_OR_DEPARTMENT_OTHER): Payer: Medicare Other | Admitting: Physical Therapy

## 2023-03-19 DIAGNOSIS — M6281 Muscle weakness (generalized): Secondary | ICD-10-CM

## 2023-03-19 DIAGNOSIS — R262 Difficulty in walking, not elsewhere classified: Secondary | ICD-10-CM | POA: Diagnosis not present

## 2023-03-19 DIAGNOSIS — M25552 Pain in left hip: Secondary | ICD-10-CM | POA: Diagnosis not present

## 2023-03-19 NOTE — Therapy (Signed)
OUTPATIENT PHYSICAL THERAPY LOWER EXTREMITY EVALUATION   Patient Name: Margaret Graham MRN: [id number] DOB:13-Sep-1946, 77 y.o., female Today's Date: 03/19/2023  END OF SESSION:  PT End of Session - 03/19/23 1416     Visit Number 6    Number of Visits 21    Date for PT Re-Evaluation 05/15/23    Authorization Type MCR progress note at 10    PT Start Time 1345    PT Stop Time 1428    PT Time Calculation (min) 43 min    Activity Tolerance Patient tolerated treatment well    Behavior During Therapy WFL for tasks assessed/performed              Past Medical History:  Diagnosis Date   Arthritis    GERD (gastroesophageal reflux disease)    Heart murmur    Hyperlipidemia    Hypothyroidism    Internal hemorrhoid    Schatzki's ring    Thyroid disease    Past Surgical History:  Procedure Laterality Date   ABDOMINAL HYSTERECTOMY     BREAST SURGERY     for dilated duct   GLUTEUS MINIMUS REPAIR Left 02/12/2023   Procedure: LEFT GLUTEUS MEDIUS REPAIR WITH POSSIBLE COLLAGEN PATCH PLACEMENT;  Surgeon: Huel Cote, MD;  Location: [facility];  Service: Orthopedics;  Laterality: Left;   MYRINGOTOMY WITH TUBE PLACEMENT Left 12/19/2022   Procedure: MYRINGOTOMY WITH TUBE PLACEMENT;  Surgeon: Laren Boom, DO;  Location: [facility];  Service: ENT;  Laterality: Left;   Patient Active Problem List   Diagnosis Date Noted   Tear of left gluteus medius tendon 02/12/2023   Strain of gluteus medius 02/06/2023   Chronic serous otitis media 12/19/2022   Osteopenia 01/23/2016   Dyslipidemia 01/18/2016   History of esophageal stricture 01/18/2016   Postoperative hypothyroidism 01/18/2016   Esophageal reflux 01/18/2016   Snoring 01/18/2016   Days since surgery: 35  PCP: Adrian Prince, MD   REFERRING PROVIDER: Huel Cote, MD   REFERRING DIAG:  S76.012A (ICD-10-CM) - Tear of left gluteus medius tendon, initial encounter      THERAPY DIAG:  Pain in left  hip  Muscle weakness (generalized)  Difficulty walking  Rationale for Evaluation and Treatment: Rehabilitation  ONSET DATE: Days since surgery: 35  PROCEDURE: 1. Left hip gluteus medius repair with collagen patch augmentation 2. Left hip trochanteric bursectomy  SUBJECTIVE:   SUBJECTIVE STATEMENT:  The patient had a significant increase in pain on Sunday and Monday it continued. It is much better now but still sore. She reports she did do some vaccuming and got some things ready for company.    Eval: Pt presents with spouse, [name].   Pt states she has had a long history with the L hip and back pain. Prior to surgery she had injections with some relief. Pt tried PT and TPDN at the time without resolution of pain.   Since surgery, pt has been using her rollator and has been compliant with PWB. Pt states that sitting still for long periods of time irritates the hip and makes it feel sore. Pt has been compliant with aspirin usage. Pt states that after surgery, the pain is very bad in the morning. Getting moving is hard. Pt has been using ice. Pt enjoys walking for exercise. Pt would walk about a mile at a time for exercise. Pt denies signs infection.   Pt is currently only 2 days post op.   PERTINENT HISTORY: LBP PAIN:  Are you having  pain? Yes: NPRS scale: 2-3/10 coming in  Pain location: L hip surgical site  Pain description: aching  Aggravating factors: moving WB Relieving factors: rest,   PRECAUTIONS: Other: Hip Glute Med Repair  WEIGHT BEARING RESTRICTIONS: Yes TTWB  FALLS:  Has patient fallen in last 6 months? No  LIVING ENVIRONMENT: Lives with: lives with their family and lives with their spouse Lives in: House/apartment Stairs: Yes, 2 story Has following equipment at home: Environmental consultant - 4 wheeled and Wheelchair (manual)  OCCUPATION: retired  PLOF: Independent  PATIENT GOALS: Return to exercise and walking w/o pain  NEXT MD VISIT: 2 wk f/u  OBJECTIVE:    DIAGNOSTIC FINDINGS:  IMPRESSION: 1. Moderate tendinosis and probable partial tearing of the left gluteus minimus tendon with associated muscular atrophy. Mild gluteus medius tendinosis, left greater than right. 2. Mild degenerative changes of both hips and sacroiliac joints. No acute osseous findings. 3. Chronic spondylosis in the visualized lower lumbar spine.    PATIENT SURVEYS:  FOTO 25 57 pts MCII 19 pts MCII   COGNITION: Overall cognitive status: Within functional limits for tasks assessed     SENSATION: WFL  POSTURE: rounded shoulders, increased thoracic kyphosis, and weight shift right  PALPATION: No TTP noted around the incision sight; aquacell bandage in place without signs of infection  LOWER EXTREMITY ROM:  Active ROM Right eval Left eval  Hip flexion WFL 90  Hip extension WFL 0  Hip abduction WFL N/A  Hip adduction WFL N/A  Hip internal rotation WFL N/A  Hip external rotation [facility] N/A  Knee flexion WFL 120  Knee extension WFL -5   (Blank rows = not tested)  LOWER EXTREMITY MMT:  Not indicated at evaluation   FUNCTIONAL TESTS:  FUNCTIONAL TESTS:  5 times sit to stand: single STS performed on single leg on L, UE support needed   GAIT: Distance walked: 75ft Assistive device utilized: able to walk RW Comments:  decreased L stance time, decreased R step length, limited hip extension bilat, step to pattern     TODAY'S TREATMENT:                                                                                                                              DATE:  5/21 Nu-step 5 min  Manual: Passive ROM within protocol limits. Patient him  protocol limits quickly: Trigger point release to lateral glutes and IT band  SAQ used RPE first set x15  REP of 5  2x15 more   Heel raise 2x20    PPT 20   5/16 Nu-step 5 min  Manual: Passive ROM within protocol limits. Patient him  protocol limits quickly  SAQ used RPE first set x15  REP of 5  2x15  more   Heel raise 2x20   Side stepping 8 steps 5 laps  PPT 2x20      PATIENT EDUCATION:  Education details: precautions/protocol, AD usage, edema management, gait, cryotherapy, diagnosis, prognosis, anatomy,  exercise progression, DOMS expectations, muscle firing,  envelope of function, HEP, POC   Person educated: Patient Education method: Explanation, Demonstration, Tactile cues, Verbal cues, and Handouts Education comprehension: verbalized understanding, returned demonstration, verbal cues required, and tactile cues required   HOME EXERCISE PROGRAM:  Access Code: [email or web address] URL: [email or web address] Date: 02/14/2023 Prepared by: Zebedee Iba  ASSESSMENT:  ASSESSMENT:   CLINICAL IMPRESSION: Despite pain over the past few days the patient did well with physical therapy today.  She had no major range of motion restrictions compared to last visit.  She is still able to tolerate the NuStep.  She is also able to tolerate basic HEP.  She does report some grinding in her low back from posterior pelvic tilts.  Next visit we will do progress to bridging progression but we will have to monitor her low back symptoms.  Therapy will continue to progress per protocol.  Patient is doing better next visit and long arc quad standing weight shift and standing march  OBJECTIVE IMPAIRMENTS Abnormal gait, decreased activity tolerance, decreased endurance, decreased mobility, difficulty walking, decreased ROM, decreased strength, hypomobility, increased muscle spasms, impaired flexibility, improper body mechanics, postural dysfunction, and pain.    ACTIVITY LIMITATIONS carrying, lifting, bending, sitting, standing, squatting, sleeping, stairs, transfers, and locomotion level   PARTICIPATION LIMITATIONS: cleaning, laundry, interpersonal relationship, driving, shopping, community activity, occupation, and exercise   PERSONAL FACTORS Age, Fitness, Time since onset of  injury/illness/exacerbation, and 3+ comorbidities:  are also affecting patient's functional outcome.    REHAB POTENTIAL: Fair     CLINICAL DECISION MAKING: Stable/uncomplicated   EVALUATION COMPLEXITY: Low     GOALS:     SHORT TERM GOALS: Target date: 03/28/2023        Pt will become independent with HEP in order to demonstrate synthesis of PT education.     Goal status: INITIAL   2.  Pt will be able to demonstrate full PROM on L hip in order to demonstrate functional improvement in LE function for progression to next phase of rehab.     Goal status: INITIAL   3.  Pt will score at least 19 pt increase on FOTO to demonstrate functional improvement in MCII and pt perceived function.      Goal status: INITIAL     LONG TERM GOALS: Target date: 05/09/2023       Pt  will become independent with final HEP in order to demonstrate synthesis of PT education.    Goal status: INITIAL   2.  Pt will be able to demonstrate full AROM of the L hip in all planes in order to demonstrate functional improvement in LE function for self-care and house hold duties.   Goal status: INITIAL    3  Pt will score >/= 57 on FOTO to demonstrate improvement in perceived L hip function.             INITIAL   4.  Pt will be able to demonstrate ability to squat to parallel without pain in order to demonstrate functional improvement in LE function for self-care and house hold duties.    Goal status: INITIAL   5.  Pt will be able to demonstrate/report ability to walk >30 mins without pain in order to demonstrate functional improvement and tolerance to exercise and community mobility.    Goal status: INITIAL     PLAN: PT FREQUENCY: 1-2x/week   PT DURATION: 12 weeks   PLANNED INTERVENTIONS:  Continue to progress weight bearing and general strengthening per protocol  Dessie Coma, PT 03/19/2023, 3:17 PM

## 2023-03-21 ENCOUNTER — Encounter (HOSPITAL_BASED_OUTPATIENT_CLINIC_OR_DEPARTMENT_OTHER): Payer: Medicare Other | Admitting: Physical Therapy

## 2023-03-26 ENCOUNTER — Encounter (HOSPITAL_BASED_OUTPATIENT_CLINIC_OR_DEPARTMENT_OTHER): Payer: Medicare Other | Admitting: Physical Therapy

## 2023-03-27 ENCOUNTER — Ambulatory Visit (INDEPENDENT_AMBULATORY_CARE_PROVIDER_SITE_OTHER): Payer: Medicare Other | Admitting: Orthopaedic Surgery

## 2023-03-27 DIAGNOSIS — S76012A Strain of muscle, fascia and tendon of left hip, initial encounter: Secondary | ICD-10-CM

## 2023-03-27 NOTE — Progress Notes (Signed)
Post Operative Evaluation    Procedure/Date of Surgery: Left gluteus medius repair 02/12/23  Interval History:    Presents today 6 weeks status post gluteus medius repair.  She had a minor setback recently when she twisted the hip and subsequently had some pain in this distribution.  She is experiencing symptoms predominantly in the quad and knee at today's visit.  There is persistent weakness with the need for using a cane in the right hand.   PMH/PSH/Family History/Social History/Meds/Allergies:    Past Medical History:  Diagnosis Date   Arthritis    GERD (gastroesophageal reflux disease)    Heart murmur    Hyperlipidemia    Hypothyroidism    Internal hemorrhoid    Schatzki's ring    Thyroid disease    Past Surgical History:  Procedure Laterality Date   ABDOMINAL HYSTERECTOMY     BREAST SURGERY     for dilated duct   GLUTEUS MINIMUS REPAIR Left 02/12/2023   Procedure: LEFT GLUTEUS MEDIUS REPAIR WITH POSSIBLE COLLAGEN PATCH PLACEMENT;  Surgeon: Huel Cote, MD;  Location: [facility];  Service: Orthopedics;  Laterality: Left;   MYRINGOTOMY WITH TUBE PLACEMENT Left 12/19/2022   Procedure: MYRINGOTOMY WITH TUBE PLACEMENT;  Surgeon: Laren Boom, DO;  Location: [facility];  Service: ENT;  Laterality: Left;   Social History   Socioeconomic History   Marital status: Married    Spouse name: Not on file   Number of children: 2   Years of education: Not on file   Highest education level: Not on file  Occupational History   Not on file  Tobacco Use   Smoking status: Never   Smokeless tobacco: Never  Vaping Use   Vaping Use: Never used  Substance and Sexual Activity   Alcohol use: Yes    Alcohol/week: 1.0 standard drink of alcohol    Types: 1 Glasses of wine per week    Comment: 1 glass a day   Drug use: No   Sexual activity: Not Currently    Partners: Male  Other Topics Concern   Not on file  Social History  Narrative   Retired from [employer].   Moved to [place] 07/2015.   From [place].   Social Determinants of Health   Financial Resource Strain: Not on file  Food Insecurity: Not on file  Transportation Needs: Not on file  Physical Activity: Not on file  Stress: Not on file  Social Connections: Not on file   Family History  Problem Relation Age of Onset   Arthritis Mother    Arthritis Father    Hyperlipidemia Father    Diabetes Son    Asthma Son    Heart disease Paternal Uncle    Colon cancer Neg Hx    Esophageal cancer Neg Hx    Rectal cancer Neg Hx    Allergies  Allergen Reactions   Declomycin [Demeclocycline]    Current Outpatient Medications  Medication Sig Dispense Refill   alendronate (FOSAMAX) 70 MG tablet Take 70 mg by mouth once a week. Take with a full glass of water on an empty stomach.     Ascorbic Acid (VITAMIN C) 1000 MG tablet Take 1,000 mg by mouth daily.     aspirin EC 325 MG tablet Take 1 tablet (325 mg total) by mouth daily. 30  tablet 0   Calcium Carb-Cholecalciferol (CALCIUM 600 + D PO) Take by mouth.     Carboxymethylcellulose Sodium (REFRESH PLUS OP) Place 1 drop into both eyes daily. PF     Cholecalciferol (VITAMIN D3) 25 MCG (1000 UT) capsule Take 1,000 Units by mouth daily.     docusate sodium (COLACE) 100 MG capsule Take 200 mg by mouth at bedtime.     ezetimibe (ZETIA) 10 MG tablet Take 10 mg by mouth daily.     famotidine (PEPCID) 40 MG tablet Take 1 tablet (40 mg total) by mouth at bedtime. 90 tablet 0   fluticasone (FLONASE) 50 MCG/ACT nasal spray Place 2 sprays into both nostrils in the morning.     levothyroxine (SYNTHROID, LEVOTHROID) 100 MCG tablet Take 1 tablet (100 mcg total) by mouth every morning. 30 tablet 3   MAGNESIUM PO Take by mouth at bedtime.     Multiple Vitamin (MULTIVITAMIN) capsule Take 1 capsule by mouth daily.     Multiple Vitamins-Minerals (PRESERVISION AREDS 2) CAPS Take 1 tablet by mouth 2 (two) times daily.     ondansetron  (ZOFRAN-ODT) 8 MG disintegrating tablet Take 1 tablet (8 mg total) by mouth every 8 (eight) hours as needed for nausea or vomiting. 10 tablet 0   oxyCODONE (OXY IR/ROXICODONE) 5 MG immediate release tablet Take 1 tablet (5 mg total) by mouth every 4 (four) hours as needed (severe pain). 20 tablet 0   simvastatin (ZOCOR) 20 MG tablet Take 1 tablet (20 mg total) by mouth daily. 30 tablet 0   zinc gluconate 50 MG tablet Take 50 mg by mouth daily.     Current Facility-Administered Medications  Medication Dose Route Frequency Provider Last Rate Last Admin   0.9 %  sodium chloride infusion  500 mL Intravenous Once Charlie Pitter III, MD       No results found.  Review of Systems:   A ROS was performed including pertinent positives and negatives as documented in the HPI.   Musculoskeletal Exam:    There were no vitals taken for this visit.  Incision is well-appearing without erythema or drainage.  She has some tenderness about the quadriceps and the vastus lateralis.  No tenderness about the lateral trochanter.  There is some weakness with resisted abduction.  She is walking with a mildly antalgic gait.  Remainder of distal neurosensory exam is intact  Imaging:      I personally reviewed and interpreted the radiographs.   Assessment:   6 weeks status post left hip gluteus medius repair overall doing well.  I did describe that overall she is recovering nicely and on pace with the typical rehab following gluteus medius repair.  I would like her to continue to transition to the strengthening portion of the protocol.  I do believe she will be able to wean from her cane within the next month or so.  I will plan to see her back in 6 weeks for reassessment  Plan :    -Return to clinic in 6 weeks for reassessment      I personally saw and evaluated the patient, and participated in the management and treatment plan.  Huel Cote, MD Attending Physician, Orthopedic Surgery  This  document was dictated using Dragon voice recognition software. A reasonable attempt at proof reading has been made to minimize errors.

## 2023-03-28 ENCOUNTER — Ambulatory Visit (HOSPITAL_BASED_OUTPATIENT_CLINIC_OR_DEPARTMENT_OTHER): Payer: Medicare Other | Admitting: Physical Therapy

## 2023-03-28 ENCOUNTER — Encounter (HOSPITAL_BASED_OUTPATIENT_CLINIC_OR_DEPARTMENT_OTHER): Payer: Self-pay | Admitting: Physical Therapy

## 2023-03-28 DIAGNOSIS — M6281 Muscle weakness (generalized): Secondary | ICD-10-CM | POA: Diagnosis not present

## 2023-03-28 DIAGNOSIS — R262 Difficulty in walking, not elsewhere classified: Secondary | ICD-10-CM | POA: Diagnosis not present

## 2023-03-28 DIAGNOSIS — M25552 Pain in left hip: Secondary | ICD-10-CM | POA: Diagnosis not present

## 2023-03-28 NOTE — Therapy (Signed)
OUTPATIENT PHYSICAL THERAPY LOWER EXTREMITY EVALUATION   Patient Name: Margaret Graham MRN: [id number] DOB:28-Aug-1946, 77 y.o., female Today's Date: 03/19/2023  END OF SESSION:  PT End of Session - 03/19/23 1416     Visit Number 6    Number of Visits 21    Date for PT Re-Evaluation 05/15/23    Authorization Type MCR progress note at 10    PT Start Time 1345    PT Stop Time 1428    PT Time Calculation (min) 43 min    Activity Tolerance Patient tolerated treatment well    Behavior During Therapy WFL for tasks assessed/performed              Past Medical History:  Diagnosis Date   Arthritis    GERD (gastroesophageal reflux disease)    Heart murmur    Hyperlipidemia    Hypothyroidism    Internal hemorrhoid    Schatzki's ring    Thyroid disease    Past Surgical History:  Procedure Laterality Date   ABDOMINAL HYSTERECTOMY     BREAST SURGERY     for dilated duct   GLUTEUS MINIMUS REPAIR Left 02/12/2023   Procedure: LEFT GLUTEUS MEDIUS REPAIR WITH POSSIBLE COLLAGEN PATCH PLACEMENT;  Surgeon: Huel Cote, MD;  Location: [facility];  Service: Orthopedics;  Laterality: Left;   MYRINGOTOMY WITH TUBE PLACEMENT Left 12/19/2022   Procedure: MYRINGOTOMY WITH TUBE PLACEMENT;  Surgeon: Laren Boom, DO;  Location: [facility];  Service: ENT;  Laterality: Left;   Patient Active Problem List   Diagnosis Date Noted   Tear of left gluteus medius tendon 02/12/2023   Strain of gluteus medius 02/06/2023   Chronic serous otitis media 12/19/2022   Osteopenia 01/23/2016   Dyslipidemia 01/18/2016   History of esophageal stricture 01/18/2016   Postoperative hypothyroidism 01/18/2016   Esophageal reflux 01/18/2016   Snoring 01/18/2016   Days since surgery: 35  PCP: Adrian Prince, MD   REFERRING PROVIDER: Huel Cote, MD   REFERRING DIAG:  S76.012A (ICD-10-CM) - Tear of left gluteus medius tendon, initial encounter      THERAPY DIAG:  Pain in left  hip  Muscle weakness (generalized)  Difficulty walking  Rationale for Evaluation and Treatment: Rehabilitation  ONSET DATE: Days since surgery: 35  PROCEDURE: 1. Left hip gluteus medius repair with collagen patch augmentation 2. Left hip trochanteric bursectomy  SUBJECTIVE:   SUBJECTIVE STATEMENT:  The patient is sore today. She is frustrated by her progress. She was advised it is more normal at this poit to be sore then to not be sore. She has been walking more.   Eval: Pt presents with spouse, [name].   Pt states she has had a long history with the L hip and back pain. Prior to surgery she had injections with some relief. Pt tried PT and TPDN at the time without resolution of pain.   Since surgery, pt has been using her rollator and has been compliant with PWB. Pt states that sitting still for long periods of time irritates the hip and makes it feel sore. Pt has been compliant with aspirin usage. Pt states that after surgery, the pain is very bad in the morning. Getting moving is hard. Pt has been using ice. Pt enjoys walking for exercise. Pt would walk about a mile at a time for exercise. Pt denies signs infection.   Pt is currently only 2 days post op.   PERTINENT HISTORY: LBP PAIN:  Are you having pain? Yes: NPRS  scale: 2-3/10 with activity  Pain location: L hip surgical site  Pain description: aching  Aggravating factors: moving WB Relieving factors: rest,   PRECAUTIONS: Other: Hip Glute Med Repair  WEIGHT BEARING RESTRICTIONS: Yes TTWB  FALLS:  Has patient fallen in last 6 months? No  LIVING ENVIRONMENT: Lives with: lives with their family and lives with their spouse Lives in: House/apartment Stairs: Yes, 2 story Has following equipment at home: Environmental consultant - 4 wheeled and Wheelchair (manual)  OCCUPATION: retired  PLOF: Independent  PATIENT GOALS: Return to exercise and walking w/o pain  NEXT MD VISIT: 2 wk f/u  OBJECTIVE:   DIAGNOSTIC FINDINGS:   IMPRESSION: 1. Moderate tendinosis and probable partial tearing of the left gluteus minimus tendon with associated muscular atrophy. Mild gluteus medius tendinosis, left greater than right. 2. Mild degenerative changes of both hips and sacroiliac joints. No acute osseous findings. 3. Chronic spondylosis in the visualized lower lumbar spine.    PATIENT SURVEYS:  FOTO 25 57 pts MCII 19 pts MCII   COGNITION: Overall cognitive status: Within functional limits for tasks assessed     SENSATION: WFL  POSTURE: rounded shoulders, increased thoracic kyphosis, and weight shift right  PALPATION: No TTP noted around the incision sight; aquacell bandage in place without signs of infection  LOWER EXTREMITY ROM:  Active ROM Right eval Left eval  Hip flexion WFL 90  Hip extension WFL 0  Hip abduction WFL N/A  Hip adduction WFL N/A  Hip internal rotation WFL N/A  Hip external rotation [facility] N/A  Knee flexion WFL 120  Knee extension WFL -5   (Blank rows = not tested)  LOWER EXTREMITY MMT:  Not indicated at evaluation   FUNCTIONAL TESTS:  FUNCTIONAL TESTS:  5 times sit to stand: single STS performed on single leg on L, UE support needed   GAIT: Distance walked: 40ft Assistive device utilized: able to walk RW Comments:  decreased L stance time, decreased R step length, limited hip extension bilat, step to pattern     TODAY'S TREATMENT:                                                                                                                              DATE: 5/30  Reviewed FOTO   Manual: Passive ROM within protocol limits. Per protocol therapy began ER stretching today. Trigger point release to lateral glutes and IT band  PPT x10  Bridge 3x10   Air-ex narrow base 2x30  Narrow base eyes closed 2x30 sec      5/21 Manual: Passive ROM within protocol limits. Per protocol therapy began ER stretching today. Trigger point release to lateral glutes and IT band  PPT  x10  Bridge 3x10    Side stepping 8 laps  Backwards stepping  Narrow base on air-ex 3x20 sec hold       5/21 Nu-step 5 min  Manual: Passive ROM within protocol limits. Patient him  protocol limits quickly: Trigger  point release to lateral glutes and IT band  SAQ used RPE first set x15  REP of 5  2x15 more   Heel raise 2x20    PPT 20   5/16 Nu-step 5 min  Manual: Passive ROM within protocol limits. Patient him  protocol limits quickly  SAQ used RPE first set x15  REP of 5  2x15 more   Heel raise 2x20   Side stepping 8 steps 5 laps  PPT 2x20      PATIENT EDUCATION:  Education details: precautions/protocol, AD usage, edema management, gait, cryotherapy, diagnosis, prognosis, anatomy, exercise progression, DOMS expectations, muscle firing,  envelope of function, HEP, POC   Person educated: Patient Education method: Explanation, Demonstration, Tactile cues, Verbal cues, and Handouts Education comprehension: verbalized understanding, returned demonstration, verbal cues required, and tactile cues required   HOME EXERCISE PROGRAM:  Access Code: [email or web address] URL: [email or web address] Date: 02/14/2023 Prepared by: Zebedee Iba  ASSESSMENT:  ASSESSMENT:   CLINICAL IMPRESSION: The patient is making great progress despite feeling like she is not. She has already reached her FOTO goal. She was advised that most people are not at her level per FOTO until 19 visits. We added in instability work. She required CGA with eyes closed. We will continue to progress per protocol.    OBJECTIVE IMPAIRMENTS Abnormal gait, decreased activity tolerance, decreased endurance, decreased mobility, difficulty walking, decreased ROM, decreased strength, hypomobility, increased muscle spasms, impaired flexibility, improper body mechanics, postural dysfunction, and pain.    ACTIVITY LIMITATIONS carrying, lifting, bending, sitting, standing, squatting, sleeping, stairs,  transfers, and locomotion level   PARTICIPATION LIMITATIONS: cleaning, laundry, interpersonal relationship, driving, shopping, community activity, occupation, and exercise   PERSONAL FACTORS Age, Fitness, Time since onset of injury/illness/exacerbation, and 3+ comorbidities:  are also affecting patient's functional outcome.    REHAB POTENTIAL: Fair     CLINICAL DECISION MAKING: Stable/uncomplicated   EVALUATION COMPLEXITY: Low     GOALS:     SHORT TERM GOALS: Target date: 03/28/2023        Pt will become independent with HEP in order to demonstrate synthesis of PT education.     Goal status: INITIAL   2.  Pt will be able to demonstrate full PROM on L hip in order to demonstrate functional improvement in LE function for progression to next phase of rehab.     Goal status: INITIAL   3.  Pt will score at least 19 pt increase on FOTO to demonstrate functional improvement in MCII and pt perceived function.      Goal status: INITIAL     LONG TERM GOALS: Target date: 05/09/2023       Pt  will become independent with final HEP in order to demonstrate synthesis of PT education.    Goal status: INITIAL   2.  Pt will be able to demonstrate full AROM of the L hip in all planes in order to demonstrate functional improvement in LE function for self-care and house hold duties.   Goal status: INITIAL    3  Pt will score >/= 57 on FOTO to demonstrate improvement in perceived L hip function.             INITIAL   4.  Pt will be able to demonstrate ability to squat to parallel without pain in order to demonstrate functional improvement in LE function for self-care and house hold duties.    Goal status: INITIAL   5.  Pt will be able to demonstrate/report ability to walk >30  mins without pain in order to demonstrate functional improvement and tolerance to exercise and community mobility.    Goal status: INITIAL     PLAN: PT FREQUENCY: 1-2x/week   PT DURATION: 12 weeks    PLANNED INTERVENTIONS:  Continue to progress weight bearing and general strengthening per protocol    Dessie Coma, PT 03/19/2023, 3:17 PM

## 2023-03-29 ENCOUNTER — Encounter (HOSPITAL_BASED_OUTPATIENT_CLINIC_OR_DEPARTMENT_OTHER): Payer: Self-pay | Admitting: Physical Therapy

## 2023-04-01 ENCOUNTER — Encounter (HOSPITAL_BASED_OUTPATIENT_CLINIC_OR_DEPARTMENT_OTHER): Payer: Self-pay | Admitting: Physical Therapy

## 2023-04-01 ENCOUNTER — Ambulatory Visit (HOSPITAL_BASED_OUTPATIENT_CLINIC_OR_DEPARTMENT_OTHER): Payer: Medicare Other | Attending: Orthopaedic Surgery | Admitting: Physical Therapy

## 2023-04-01 DIAGNOSIS — H2513 Age-related nuclear cataract, bilateral: Secondary | ICD-10-CM | POA: Diagnosis not present

## 2023-04-01 DIAGNOSIS — H04123 Dry eye syndrome of bilateral lacrimal glands: Secondary | ICD-10-CM | POA: Diagnosis not present

## 2023-04-01 DIAGNOSIS — M25552 Pain in left hip: Secondary | ICD-10-CM | POA: Diagnosis not present

## 2023-04-01 DIAGNOSIS — R262 Difficulty in walking, not elsewhere classified: Secondary | ICD-10-CM | POA: Insufficient documentation

## 2023-04-01 DIAGNOSIS — M6281 Muscle weakness (generalized): Secondary | ICD-10-CM | POA: Insufficient documentation

## 2023-04-01 NOTE — Therapy (Signed)
OUTPATIENT PHYSICAL THERAPY LOWER EXTREMITY EVALUATION   Patient Name: Margaret Graham MRN: [id number] DOB:[birth year], 77 y.o., female Today's Date: 03/19/2023  END OF SESSION:  PT End of Session - 03/19/23 1416     Visit Number 6    Number of Visits 21    Date for PT Re-Evaluation 05/15/23    Authorization Type MCR progress note at 10    PT Start Time 1345    PT Stop Time 1428    PT Time Calculation (min) 43 min    Activity Tolerance Patient tolerated treatment well    Behavior During Therapy WFL for tasks assessed/performed              Past Medical History:  Diagnosis Date   Arthritis    GERD (gastroesophageal reflux disease)    Heart murmur    Hyperlipidemia    Hypothyroidism    Internal hemorrhoid    Schatzki's ring    Thyroid disease    Past Surgical History:  Procedure Laterality Date   ABDOMINAL HYSTERECTOMY     BREAST SURGERY     for dilated duct   GLUTEUS MINIMUS REPAIR Left 02/12/2023   Procedure: LEFT GLUTEUS MEDIUS REPAIR WITH POSSIBLE COLLAGEN PATCH PLACEMENT;  Surgeon: Huel Cote, MD;  Location: [facility];  Service: Orthopedics;  Laterality: Left;   MYRINGOTOMY WITH TUBE PLACEMENT Left 12/19/2022   Procedure: MYRINGOTOMY WITH TUBE PLACEMENT;  Surgeon: Laren Boom, DO;  Location: [facility];  Service: ENT;  Laterality: Left;   Patient Active Problem List   Diagnosis Date Noted   Tear of left gluteus medius tendon 02/12/2023   Strain of gluteus medius 02/06/2023   Chronic serous otitis media 12/19/2022   Osteopenia 01/23/2016   Dyslipidemia 01/18/2016   History of esophageal stricture 01/18/2016   Postoperative hypothyroidism 01/18/2016   Esophageal reflux 01/18/2016   Snoring 01/18/2016   Days since surgery: 35  PCP: Adrian Prince, MD   REFERRING PROVIDER: Huel Cote, MD   REFERRING DIAG:  S76.012A (ICD-10-CM) - Tear of left gluteus medius tendon, initial encounter      THERAPY DIAG:  Pain in left  hip  Muscle weakness (generalized)  Difficulty walking  Rationale for Evaluation and Treatment: Rehabilitation  ONSET DATE: Days since surgery: 35  PROCEDURE: 1. Left hip gluteus medius repair with collagen patch augmentation 2. Left hip trochanteric bursectomy  SUBJECTIVE:   SUBJECTIVE STATEMENT:  The patient did much better over the weekend> she has started not using her cane some.     Eval: Pt presents with spouse, [name].   Pt states she has had a long history with the L hip and back pain. Prior to surgery she had injections with some relief. Pt tried PT and TPDN at the time without resolution of pain.   Since surgery, pt has been using her rollator and has been compliant with PWB. Pt states that sitting still for long periods of time irritates the hip and makes it feel sore. Pt has been compliant with aspirin usage. Pt states that after surgery, the pain is very bad in the morning. Getting moving is hard. Pt has been using ice. Pt enjoys walking for exercise. Pt would walk about a mile at a time for exercise. Pt denies signs infection.   Pt is currently only 2 days post op.   PERTINENT HISTORY: LBP PAIN: 0/10 with activity  Pain location: L hip surgical site  Pain description: aching  Aggravating factors: moving WB Relieving factors: rest,  PRECAUTIONS: Other: Hip Glute Med Repair  WEIGHT BEARING RESTRICTIONS: Yes TTWB  FALLS:  Has patient fallen in last 6 months? No  LIVING ENVIRONMENT: Lives with: lives with their family and lives with their spouse Lives in: House/apartment Stairs: Yes, 2 story Has following equipment at home: Environmental consultant - 4 wheeled and Wheelchair (manual)  OCCUPATION: retired  PLOF: Independent  PATIENT GOALS: Return to exercise and walking w/o pain  NEXT MD VISIT: 2 wk f/u  OBJECTIVE:   DIAGNOSTIC FINDINGS:  IMPRESSION: 1. Moderate tendinosis and probable partial tearing of the left gluteus minimus tendon with associated muscular  atrophy. Mild gluteus medius tendinosis, left greater than right. 2. Mild degenerative changes of both hips and sacroiliac joints. No acute osseous findings. 3. Chronic spondylosis in the visualized lower lumbar spine.    PATIENT SURVEYS:  FOTO 25 57 pts MCII 19 pts MCII   COGNITION: Overall cognitive status: Within functional limits for tasks assessed     SENSATION: WFL  POSTURE: rounded shoulders, increased thoracic kyphosis, and weight shift right  PALPATION: No TTP noted around the incision sight; aquacell bandage in place without signs of infection  LOWER EXTREMITY ROM:  Active ROM Right eval Left eval  Hip flexion WFL 90  Hip extension WFL 0  Hip abduction WFL N/A  Hip adduction WFL N/A  Hip internal rotation WFL N/A  Hip external rotation [facility] N/A  Knee flexion WFL 120  Knee extension WFL -5   (Blank rows = not tested)  LOWER EXTREMITY MMT:  Not indicated at evaluation   FUNCTIONAL TESTS:  FUNCTIONAL TESTS:  5 times sit to stand: single STS performed on single leg on L, UE support needed   GAIT: Distance walked: 80ft Assistive device utilized: able to walk RW Comments:  decreased L stance time, decreased R step length, limited hip extension bilat, step to pattern     TODAY'S TREATMENT:                                                                                                                              DATE: 5/30  Reviewed FOTO   Manual: Passive ROM within protocol limits. Per protocol therapy began ER stretching today. Trigger point release to lateral glutes and IT band  PPT x10  Bridge 3x10   Air-ex narrow base 2x30  Narrow base eyes closed 2x30 sec      5/21 Manual: Passive ROM within protocol limits. Per protocol therapy began ER stretching today. Trigger point release to lateral glutes and IT band  PPT x10  Bridge 3x10    Side stepping 8 laps  Backwards stepping  Narrow base on air-ex 3x20 sec hold        5/21 Nu-step 5 min  Manual: Passive ROM within protocol limits. Patient him  protocol limits quickly: Trigger point release to lateral glutes and IT band  SAQ used RPE first set x15  REP of 5  2x15 more   Heel  raise 2x20    PPT 20   5/16 Nu-step 5 min  Manual: Passive ROM within protocol limits. Patient him  protocol limits quickly  SAQ used RPE first set x15  REP of 5  2x15 more   Heel raise 2x20   Side stepping 8 steps 5 laps  PPT 2x20      PATIENT EDUCATION:  Education details: precautions/protocol, AD usage, edema management, gait, cryotherapy, diagnosis, prognosis, anatomy, exercise progression, DOMS expectations, muscle firing,  envelope of function, HEP, POC   Person educated: Patient Education method: Explanation, Demonstration, Tactile cues, Verbal cues, and Handouts Education comprehension: verbalized understanding, returned demonstration, verbal cues required, and tactile cues required   HOME EXERCISE PROGRAM:  Access Code: [email or web address] URL: [email or web address] Date: 02/14/2023 Prepared by: Zebedee Iba  ASSESSMENT:  ASSESSMENT:   CLINICAL IMPRESSION: The patient is making great progress despite feeling like she is not. She has already reached her FOTO goal. She was advised that most people are not at her level per FOTO until 19 visits. We added in instability Graham. She required CGA with eyes closed. We will continue to progress per protocol.    OBJECTIVE IMPAIRMENTS Abnormal gait, decreased activity tolerance, decreased endurance, decreased mobility, difficulty walking, decreased ROM, decreased strength, hypomobility, increased muscle spasms, impaired flexibility, improper body mechanics, postural dysfunction, and pain.    ACTIVITY LIMITATIONS carrying, lifting, bending, sitting, standing, squatting, sleeping, stairs, transfers, and locomotion level   PARTICIPATION LIMITATIONS: cleaning, laundry, interpersonal relationship,  driving, shopping, community activity, occupation, and exercise   PERSONAL FACTORS Age, Fitness, Time since onset of injury/illness/exacerbation, and 3+ comorbidities:  are also affecting patient's functional outcome.    REHAB POTENTIAL: Fair     CLINICAL DECISION MAKING: Stable/uncomplicated   EVALUATION COMPLEXITY: Low     GOALS:     SHORT TERM GOALS: Target date: 03/28/2023        Pt will become independent with HEP in order to demonstrate synthesis of PT education.     Goal status: INITIAL   2.  Pt will be able to demonstrate full PROM on L hip in order to demonstrate functional improvement in LE function for progression to next phase of rehab.     Goal status: INITIAL   3.  Pt will score at least 19 pt increase on FOTO to demonstrate functional improvement in MCII and pt perceived function.      Goal status: INITIAL     LONG TERM GOALS: Target date: 05/09/2023       Pt  will become independent with final HEP in order to demonstrate synthesis of PT education.    Goal status: INITIAL   2.  Pt will be able to demonstrate full AROM of the L hip in all planes in order to demonstrate functional improvement in LE function for self-care and house hold duties.   Goal status: INITIAL    3  Pt will score >/= 57 on FOTO to demonstrate improvement in perceived L hip function.             INITIAL   4.  Pt will be able to demonstrate ability to squat to parallel without pain in order to demonstrate functional improvement in LE function for self-care and house hold duties.    Goal status: INITIAL   5.  Pt will be able to demonstrate/report ability to walk >30 mins without pain in order to demonstrate functional improvement and tolerance to exercise and community mobility.    Goal status: INITIAL  PLAN: PT FREQUENCY: 1-2x/week   PT DURATION: 12 weeks   PLANNED INTERVENTIONS:  Continue to progress weight bearing and general strengthening per protocol    Dessie Coma, PT 03/19/2023, 3:17 PM

## 2023-04-02 ENCOUNTER — Encounter (HOSPITAL_BASED_OUTPATIENT_CLINIC_OR_DEPARTMENT_OTHER): Payer: Self-pay | Admitting: Physical Therapy

## 2023-04-03 DIAGNOSIS — D2261 Melanocytic nevi of right upper limb, including shoulder: Secondary | ICD-10-CM | POA: Diagnosis not present

## 2023-04-03 DIAGNOSIS — D1801 Hemangioma of skin and subcutaneous tissue: Secondary | ICD-10-CM | POA: Diagnosis not present

## 2023-04-03 DIAGNOSIS — L570 Actinic keratosis: Secondary | ICD-10-CM | POA: Diagnosis not present

## 2023-04-03 DIAGNOSIS — L814 Other melanin hyperpigmentation: Secondary | ICD-10-CM | POA: Diagnosis not present

## 2023-04-03 DIAGNOSIS — L821 Other seborrheic keratosis: Secondary | ICD-10-CM | POA: Diagnosis not present

## 2023-04-03 DIAGNOSIS — D692 Other nonthrombocytopenic purpura: Secondary | ICD-10-CM | POA: Diagnosis not present

## 2023-04-10 ENCOUNTER — Ambulatory Visit (HOSPITAL_BASED_OUTPATIENT_CLINIC_OR_DEPARTMENT_OTHER): Payer: Medicare Other | Admitting: Physical Therapy

## 2023-04-10 DIAGNOSIS — M6281 Muscle weakness (generalized): Secondary | ICD-10-CM

## 2023-04-10 DIAGNOSIS — R262 Difficulty in walking, not elsewhere classified: Secondary | ICD-10-CM | POA: Diagnosis not present

## 2023-04-10 DIAGNOSIS — M25552 Pain in left hip: Secondary | ICD-10-CM | POA: Diagnosis not present

## 2023-04-10 NOTE — Therapy (Signed)
OUTPATIENT PHYSICAL THERAPY LOWER EXTREMITY EVALUATION   Patient Name: Margaret Graham MRN: [id number] DOB:August 04, 1946, 77 y.o., female Today's Date: 04/11/2023  END OF SESSION:  PT End of Session - 04/11/23 1019     Visit Number 9    Number of Visits 21    Date for PT Re-Evaluation 05/15/23    Authorization Type MCR progress note at 10    PT Start Time 1515    PT Stop Time 1559    PT Time Calculation (min) 44 min    Activity Tolerance Patient tolerated treatment well    Behavior During Therapy WFL for tasks assessed/performed              Past Medical History:  Diagnosis Date   Arthritis    GERD (gastroesophageal reflux disease)    Heart murmur    Hyperlipidemia    Hypothyroidism    Internal hemorrhoid    Schatzki's ring    Thyroid disease    Past Surgical History:  Procedure Laterality Date   ABDOMINAL HYSTERECTOMY     BREAST SURGERY     for dilated duct   GLUTEUS MINIMUS REPAIR Left 02/12/2023   Procedure: LEFT GLUTEUS MEDIUS REPAIR WITH POSSIBLE COLLAGEN PATCH PLACEMENT;  Surgeon: Huel Cote, MD;  Location: [facility];  Service: Orthopedics;  Laterality: Left;   MYRINGOTOMY WITH TUBE PLACEMENT Left 12/19/2022   Procedure: MYRINGOTOMY WITH TUBE PLACEMENT;  Surgeon: Laren Boom, DO;  Location: [facility];  Service: ENT;  Laterality: Left;   Patient Active Problem List   Diagnosis Date Noted   Tear of left gluteus medius tendon 02/12/2023   Strain of gluteus medius 02/06/2023   Chronic serous otitis media 12/19/2022   Osteopenia 01/23/2016   Dyslipidemia 01/18/2016   History of esophageal stricture 01/18/2016   Postoperative hypothyroidism 01/18/2016   Esophageal reflux 01/18/2016   Snoring 01/18/2016   Days since surgery: 57  PCP: Adrian Prince, MD   REFERRING PROVIDER: Huel Cote, MD   REFERRING DIAG:  S76.012A (ICD-10-CM) - Tear of left gluteus medius tendon, initial encounter      THERAPY DIAG:  Pain in left  hip  Muscle weakness (generalized)  Difficulty walking  Rationale for Evaluation and Treatment: Rehabilitation  ONSET DATE: Days since surgery: Days since surgery: 57   PROCEDURE: 1. Left hip gluteus medius repair with collagen patch augmentation 2. Left hip trochanteric bursectomy  SUBJECTIVE:   SUBJECTIVE STATEMENT:  The patient reports she is doing well. She reports pain on right side of her back but that has resolved.      Eval: Pt presents with spouse, [name].   Pt states she has had a long history with the L hip and back pain. Prior to surgery she had injections with some relief. Pt tried PT and TPDN at the time without resolution of pain.   Since surgery, pt has been using her rollator and has been compliant with PWB. Pt states that sitting still for long periods of time irritates the hip and makes it feel sore. Pt has been compliant with aspirin usage. Pt states that after surgery, the pain is very bad in the morning. Getting moving is hard. Pt has been using ice. Pt enjoys walking for exercise. Pt would walk about a mile at a time for exercise. Pt denies signs infection.   Pt is currently only 2 days post op.   PERTINENT HISTORY: LBP PAIN: 0/10 with activity  Pain location: L hip surgical site  Pain description: aching  Aggravating factors: moving WB Relieving factors: rest,   PRECAUTIONS: Other: Hip Glute Med Repair  WEIGHT BEARING RESTRICTIONS: Yes TTWB  FALLS:  Has patient fallen in last 6 months? No  LIVING ENVIRONMENT: Lives with: lives with their family and lives with their spouse Lives in: House/apartment Stairs: Yes, 2 story Has following equipment at home: Environmental consultant - 4 wheeled and Wheelchair (manual)  OCCUPATION: retired  PLOF: Independent  PATIENT GOALS: Return to exercise and walking w/o pain  NEXT MD VISIT: 2 wk f/u  OBJECTIVE:   DIAGNOSTIC FINDINGS:  IMPRESSION: 1. Moderate tendinosis and probable partial tearing of the left gluteus  minimus tendon with associated muscular atrophy. Mild gluteus medius tendinosis, left greater than right. 2. Mild degenerative changes of both hips and sacroiliac joints. No acute osseous findings. 3. Chronic spondylosis in the visualized lower lumbar spine.    PATIENT SURVEYS:  FOTO 25 57 pts MCII 19 pts MCII   COGNITION: Overall cognitive status: Within functional limits for tasks assessed     SENSATION: WFL  POSTURE: rounded shoulders, increased thoracic kyphosis, and weight shift right  PALPATION: No TTP noted around the incision sight; aquacell bandage in place without signs of infection  LOWER EXTREMITY ROM:  Active ROM Right eval Left eval  Hip flexion WFL 90  Hip extension WFL 0  Hip abduction WFL N/A  Hip adduction WFL N/A  Hip internal rotation WFL N/A  Hip external rotation [facility] N/A  Knee flexion WFL 120  Knee extension WFL -5   (Blank rows = not tested)  LOWER EXTREMITY MMT:  Not indicated at evaluation   FUNCTIONAL TESTS:  FUNCTIONAL TESTS:  5 times sit to stand: single STS performed on single leg on L, UE support needed   GAIT: Distance walked: 64ft Assistive device utilized: able to walk RW Comments:  decreased L stance time, decreased R step length, limited hip extension bilat, step to pattern     TODAY'S TREATMENT:                                                                                                                              DATE:  6/12 Manual: Passive ROM within protocol limits. Per protocol therapy began ER stretching today. Trigger point release to lateral glutes and IT band  PPT x20  Bridge 3x10   Step up 2 inch 2x10  Side step up 2 inch 2x10   LAQ 2x20 REP of 3 1.5 pounds will load more next visit     6/4 Manual: Passive ROM within protocol limits. Per protocol therapy began ER stretching today. Trigger point release to lateral glutes and IT band  PPT x20  Bridge 3x10   Hip abduction isometric 50 % 3x10    Step up 2 inch 3x10   Nu-step 5 min L3   5/30  Reviewed FOTO   Manual: Passive ROM within protocol limits. Per protocol therapy began ER stretching today. Trigger point release to lateral glutes  and IT band  PPT x10  Bridge 3x10   Air-ex narrow base 2x30  Narrow base eyes closed 2x30 sec      5/21 Manual: Passive ROM within protocol limits. Per protocol therapy began ER stretching today. Trigger point release to lateral glutes and IT band  PPT x10  Bridge 3x10    Side stepping 8 laps  Backwards stepping  Narrow base on air-ex 3x20 sec hold       5/21 Nu-step 5 min  Manual: Passive ROM within protocol limits. Patient him  protocol limits quickly: Trigger point release to lateral glutes and IT band  SAQ used RPE first set x15  REP of 5  2x15 more   Heel raise 2x20    PPT 20   5/16 Nu-step 5 min  Manual: Passive ROM within protocol limits. Patient him  protocol limits quickly  SAQ used RPE first set x15  REP of 5  2x15 more   Heel raise 2x20   Side stepping 8 steps 5 laps  PPT 2x20      PATIENT EDUCATION:  Education details: precautions/protocol, AD usage, edema management, gait, cryotherapy, diagnosis, prognosis, anatomy, exercise progression, DOMS expectations, muscle firing,  envelope of function, HEP, POC   Person educated: Patient Education method: Explanation, Demonstration, Tactile cues, Verbal cues, and Handouts Education comprehension: verbalized understanding, returned demonstration, verbal cues required, and tactile cues required   HOME EXERCISE PROGRAM:  Access Code: [email or web address] URL: [email or web address] Date: 02/14/2023 Prepared by: Zebedee Iba  ASSESSMENT:  ASSESSMENT:   CLINICAL IMPRESSION: The patient is making great progress. She tolerated there-ex well. we assesed her ER today. It was measured at 45 degrees. She had no significant increase in pain with treatment. We added LAQ. We will likley have to load  more then ext visit. We will progress the height of her strep as tolerated.    OBJECTIVE IMPAIRMENTS Abnormal gait, decreased activity tolerance, decreased endurance, decreased mobility, difficulty walking, decreased ROM, decreased strength, hypomobility, increased muscle spasms, impaired flexibility, improper body mechanics, postural dysfunction, and pain.    ACTIVITY LIMITATIONS carrying, lifting, bending, sitting, standing, squatting, sleeping, stairs, transfers, and locomotion level   PARTICIPATION LIMITATIONS: cleaning, laundry, interpersonal relationship, driving, shopping, community activity, occupation, and exercise   PERSONAL FACTORS Age, Fitness, Time since onset of injury/illness/exacerbation, and 3+ comorbidities:  are also affecting patient's functional outcome.    REHAB POTENTIAL: Fair     CLINICAL DECISION MAKING: Stable/uncomplicated   EVALUATION COMPLEXITY: Low     GOALS:     SHORT TERM GOALS: Target date: 03/28/2023        Pt will become independent with HEP in order to demonstrate synthesis of PT education.     Goal status: INITIAL   2.  Pt will be able to demonstrate full PROM on L hip in order to demonstrate functional improvement in LE function for progression to next phase of rehab.     Goal status: INITIAL   3.  Pt will score at least 19 pt increase on FOTO to demonstrate functional improvement in MCII and pt perceived function.      Goal status: INITIAL     LONG TERM GOALS: Target date: 05/09/2023       Pt  will become independent with final HEP in order to demonstrate synthesis of PT education.    Goal status: INITIAL   2.  Pt will be able to demonstrate full AROM of the L hip in all planes in order to demonstrate functional improvement in  LE function for self-care and house hold duties.   Goal status: INITIAL    3  Pt will score >/= 57 on FOTO to demonstrate improvement in perceived L hip function.             INITIAL   4.  Pt will be  able to demonstrate ability to squat to parallel without pain in order to demonstrate functional improvement in LE function for self-care and house hold duties.    Goal status: INITIAL   5.  Pt will be able to demonstrate/report ability to walk >30 mins without pain in order to demonstrate functional improvement and tolerance to exercise and community mobility.    Goal status: INITIAL     PLAN: PT FREQUENCY: 1-2x/week   PT DURATION: 12 weeks   PLANNED INTERVENTIONS:  Continue to progress weight bearing and general strengthening per protocol    Dessie Coma, PT 04/11/2023, 10:21 AM

## 2023-04-11 ENCOUNTER — Encounter (HOSPITAL_BASED_OUTPATIENT_CLINIC_OR_DEPARTMENT_OTHER): Payer: Self-pay | Admitting: Physical Therapy

## 2023-04-17 ENCOUNTER — Ambulatory Visit (HOSPITAL_BASED_OUTPATIENT_CLINIC_OR_DEPARTMENT_OTHER): Payer: Medicare Other | Admitting: Physical Therapy

## 2023-04-17 ENCOUNTER — Encounter (HOSPITAL_BASED_OUTPATIENT_CLINIC_OR_DEPARTMENT_OTHER): Payer: Self-pay | Admitting: Physical Therapy

## 2023-04-17 DIAGNOSIS — R262 Difficulty in walking, not elsewhere classified: Secondary | ICD-10-CM

## 2023-04-17 DIAGNOSIS — M25552 Pain in left hip: Secondary | ICD-10-CM | POA: Diagnosis not present

## 2023-04-17 DIAGNOSIS — M6281 Muscle weakness (generalized): Secondary | ICD-10-CM

## 2023-04-17 NOTE — Therapy (Unsigned)
OUTPATIENT PHYSICAL THERAPY LOWER EXTREMITY Progess Not    Patient Name: Margaret Graham MRN: [id number] DOB:1946/07/05, 77 y.o., female Today's Date: 04/18/2023  END OF SESSION:  PT End of Session - 04/17/23 1519     Visit Number 10    Number of Visits 21    Date for PT Re-Evaluation 05/15/23    Authorization Type MCR progress note at 10    PT Start Time 1515    PT Stop Time 1558    PT Time Calculation (min) 43 min    Activity Tolerance Patient tolerated treatment well    Behavior During Therapy WFL for tasks assessed/performed              Past Medical History:  Diagnosis Date   Arthritis    GERD (gastroesophageal reflux disease)    Heart murmur    Hyperlipidemia    Hypothyroidism    Internal hemorrhoid    Schatzki's ring    Thyroid disease    Past Surgical History:  Procedure Laterality Date   ABDOMINAL HYSTERECTOMY     BREAST SURGERY     for dilated duct   GLUTEUS MINIMUS REPAIR Left 02/12/2023   Procedure: LEFT GLUTEUS MEDIUS REPAIR WITH POSSIBLE COLLAGEN PATCH PLACEMENT;  Surgeon: Huel Cote, MD;  Location: [facility];  Service: Orthopedics;  Laterality: Left;   MYRINGOTOMY WITH TUBE PLACEMENT Left 12/19/2022   Procedure: MYRINGOTOMY WITH TUBE PLACEMENT;  Surgeon: Laren Boom, DO;  Location: [facility];  Service: ENT;  Laterality: Left;   Patient Active Problem List   Diagnosis Date Noted   Tear of left gluteus medius tendon 02/12/2023   Strain of gluteus medius 02/06/2023   Chronic serous otitis media 12/19/2022   Osteopenia 01/23/2016   Dyslipidemia 01/18/2016   History of esophageal stricture 01/18/2016   Postoperative hypothyroidism 01/18/2016   Esophageal reflux 01/18/2016   Snoring 01/18/2016   Progress Note Reporting Period to4/18/2024 04/17/2023  See note below for Objective Data and Assessment of Progress/Goals.      Days since surgery: 40  PCP: Adrian Prince, MD   REFERRING PROVIDER: Huel Cote, MD    REFERRING DIAG:  [phone] (ICD-10-CM) - Tear of left gluteus medius tendon, initial encounter      THERAPY DIAG:  Pain in left hip  Muscle weakness (generalized)  Difficulty walking  Rationale for Evaluation and Treatment: Rehabilitation  ONSET DATE: Days since surgery: Days since surgery: 64   PROCEDURE: 1. Left hip gluteus medius repair with collagen patch augmentation 2. Left hip trochanteric bursectomy  SUBJECTIVE:   SUBJECTIVE STATEMENT:  The patients back has been the biggest issue It has hurt pretty significantly over the past few days.     Eval: Pt presents with spouse, [name].   Pt states she has had a long history with the L hip and back pain. Prior to surgery she had injections with some relief. Pt tried PT and TPDN at the time without resolution of pain.   Since surgery, pt has been using her rollator and has been compliant with PWB. Pt states that sitting still for long periods of time irritates the hip and makes it feel sore. Pt has been compliant with aspirin usage. Pt states that after surgery, the pain is very bad in the morning. Getting moving is hard. Pt has been using ice. Pt enjoys walking for exercise. Pt would walk about a mile at a time for exercise. Pt denies signs infection.   Pt is currently only 2 days post  op.   PERTINENT HISTORY: LBP PAIN: 6/10 with activity  Pain location: low back pain  Pain description: aching  Aggravating factors: moving WB Relieving factors: rest,   PRECAUTIONS: Other: Hip Glute Med Repair  WEIGHT BEARING RESTRICTIONS: Yes TTWB  FALLS:  Has patient fallen in last 6 months? No  LIVING ENVIRONMENT: Lives with: lives with their family and lives with their spouse Lives in: House/apartment Stairs: Yes, 2 story Has following equipment at home: Environmental consultant - 4 wheeled and Wheelchair (manual)  OCCUPATION: retired  PLOF: Independent  PATIENT GOALS: Return to exercise and walking w/o pain  NEXT MD VISIT: 2 wk  f/u  OBJECTIVE:   DIAGNOSTIC FINDINGS:  IMPRESSION: 1. Moderate tendinosis and probable partial tearing of the left gluteus minimus tendon with associated muscular atrophy. Mild gluteus medius tendinosis, left greater than right. 2. Mild degenerative changes of both hips and sacroiliac joints. No acute osseous findings. 3. Chronic spondylosis in the visualized lower lumbar spine.    PATIENT SURVEYS:  FOTO 25 57 pts MCII 19 pts MCII   COGNITION: Overall cognitive status: Within functional limits for tasks assessed     SENSATION: WFL  POSTURE: rounded shoulders, increased thoracic kyphosis, and weight shift right  PALPATION: No TTP noted around the incision sight; aquacell bandage in place without signs of infection  LOWER EXTREMITY ROM:  Active ROM Right eval Left eval Left   Hip flexion WFL 90   Hip extension WFL 0   Hip abduction WFL N/A   Hip adduction WFL N/A   Hip internal rotation WFL N/A   Hip external rotation [facility] N/A   Knee flexion WFL 120   Knee extension WFL -5    (Blank rows = not tested)  LOWER EXTREMITY MMT: LOWER EXTREMITY MMT:    MMT Right eval Left eval  Hip flexion 15.5 13.5  Hip extension    Hip abduction 17.6 13.1  Hip adduction    Hip internal rotation    Hip external rotation    Knee flexion    Knee extension 23.3 28.2  Ankle dorsiflexion    Ankle plantarflexion    Ankle inversion    Ankle eversion     (Blank rows = not tested)   FUNCTIONAL TESTS:  FUNCTIONAL TESTS:  5 times sit to stand: single STS performed on single leg on L, UE support needed   GAIT: Distance walked: 24ft Assistive device utilized: able to walk RW Comments:  decreased L stance time, decreased R step length, limited hip extension bilat, step to pattern     TODAY'S TREATMENT:                                                                                                                              DATE:  6/20 Manual: Passive ROM within protocol  limits. Per protocol therapy began ER stretching today. Trigger point release to lateral glutes and IT band, trigger point release to the lumbar spine, reviewed self  trigger point release to the lumbar spine.   PPT x20  Ball stretch for lumbar fwd 5x 10 sec hold  Lateral 5x10sec hold bilateral   LAQ 2x10 yellow band  Hip abdcution 3x10 yellow  6/12 Manual: Passive ROM within protocol limits. Per protocol therapy began ER stretching today. Trigger point release to lateral glutes and IT band  PPT x20  Bridge 3x10   Step up 2 inch 2x10  Side step up 2 inch 2x10   LAQ 2x20 REP of 3 1.5 pounds will load more next visit     6/4 Manual: Passive ROM within protocol limits. Per protocol therapy began ER stretching today. Trigger point release to lateral glutes and IT band  PPT x20  Bridge 3x10   Hip abduction isometric 50 % 3x10   Step up 2 inch 3x10   Nu-step 5 min L3   5/30  Reviewed FOTO   Manual: Passive ROM within protocol limits. Per protocol therapy began ER stretching today. Trigger point release to lateral glutes and IT band  PPT x10  Bridge 3x10   Air-ex narrow base 2x30  Narrow base eyes closed 2x30 sec      5/21 Manual: Passive ROM within protocol limits. Per protocol therapy began ER stretching today. Trigger point release to lateral glutes and IT band  PPT x10  Bridge 3x10    Side stepping 8 laps  Backwards stepping  Narrow base on air-ex 3x20 sec hold       5/21 Nu-step 5 min  Manual: Passive ROM within protocol limits. Patient him  protocol limits quickly: Trigger point release to lateral glutes and IT band  SAQ used RPE first set x15  REP of 5  2x15 more   Heel raise 2x20    PPT 20   5/16 Nu-step 5 min  Manual: Passive ROM within protocol limits. Patient him  protocol limits quickly  SAQ used RPE first set x15  REP of 5  2x15 more   Heel raise 2x20   Side stepping 8 steps 5 laps  PPT 2x20   Reviewed golas and  objective measures for the back.     PATIENT EDUCATION:  Education details: precautions/protocol, AD usage, edema management, gait, cryotherapy, diagnosis, prognosis, anatomy, exercise progression, DOMS expectations, muscle firing,  envelope of function, HEP, POC   Person educated: Patient Education method: Explanation, Demonstration, Tactile cues, Verbal cues, and Handouts Education comprehension: verbalized understanding, returned demonstration, verbal cues required, and tactile cues required   HOME EXERCISE PROGRAM:  Access Code: [email or web address] URL: [email or web address] Date: 02/14/2023 Prepared by: Zebedee Iba  ASSESSMENT:  ASSESSMENT:   CLINICAL IMPRESSION: The patient is making great progress. Her ROM is slightly limited in ER but progressing well. She has no pain in her gluteal area. Today she had spasming in her lower lumbar spine. She reports this is an issue for her from time to time. We focused on manual therapy and stretching of the lower back. Her strength has improved significantly. She Is no longer using a cane. We will work over the next several weeks at improving functional activity's such as steps and suqats. See below for goal specific progress.   OBJECTIVE IMPAIRMENTS Abnormal gait, decreased activity tolerance, decreased endurance, decreased mobility, difficulty walking, decreased ROM, decreased strength, hypomobility, increased muscle spasms, impaired flexibility, improper body mechanics, postural dysfunction, and pain.    ACTIVITY LIMITATIONS carrying, lifting, bending, sitting, standing, squatting, sleeping, stairs, transfers, and locomotion level   PARTICIPATION LIMITATIONS: cleaning, laundry, interpersonal relationship, driving, shopping,  community activity, occupation, and exercise   PERSONAL FACTORS Age, Fitness, Time since onset of injury/illness/exacerbation, and 3+ comorbidities:  are also affecting patient's functional outcome.    REHAB  POTENTIAL: Fair     CLINICAL DECISION MAKING: Stable/uncomplicated   EVALUATION COMPLEXITY: Low     GOALS:     SHORT TERM GOALS: Target date: 03/28/2023        Pt will become independent with HEP in order to demonstrate synthesis of PT education.     Goal status: INITIAL   2.  Pt will be able to demonstrate full PROM on L hip in order to demonstrate functional improvement in LE function for progression to next phase of rehab.     Goal status: INITIAL   3.  Pt will score at least 19 pt increase on FOTO to demonstrate functional improvement in MCII and pt perceived function.      Goal status: INITIAL     LONG TERM GOALS: Target date: 05/09/2023       Pt  will become independent with final HEP in order to demonstrate synthesis of PT education.    Goal status: INITIAL   2.  Pt will be able to demonstrate full AROM of the L hip in all planes in order to demonstrate functional improvement in LE function for self-care and house hold duties.   Goal status: INITIAL    3  Pt will score >/= 57 on FOTO to demonstrate improvement in perceived L hip function.             INITIAL   4.  Pt will be able to demonstrate ability to squat to parallel without pain in order to demonstrate functional improvement in LE function for self-care and house hold duties.    Goal status: INITIAL   5.  Pt will be able to demonstrate/report ability to walk >30 mins without pain in order to demonstrate functional improvement and tolerance to exercise and community mobility.    Goal status: INITIAL     PLAN: PT FREQUENCY: 1-2x/week   PT DURATION: 12 weeks   PLANNED INTERVENTIONS:  Continue to progress weight bearing and general strengthening per protocol    Dessie Coma, PT 04/18/2023, 10:49 AM

## 2023-04-18 ENCOUNTER — Encounter (HOSPITAL_BASED_OUTPATIENT_CLINIC_OR_DEPARTMENT_OTHER): Payer: Self-pay | Admitting: Physical Therapy

## 2023-04-24 ENCOUNTER — Encounter (HOSPITAL_BASED_OUTPATIENT_CLINIC_OR_DEPARTMENT_OTHER): Payer: Self-pay | Admitting: Physical Therapy

## 2023-04-24 ENCOUNTER — Ambulatory Visit (HOSPITAL_BASED_OUTPATIENT_CLINIC_OR_DEPARTMENT_OTHER): Payer: Medicare Other | Admitting: Physical Therapy

## 2023-04-24 DIAGNOSIS — R262 Difficulty in walking, not elsewhere classified: Secondary | ICD-10-CM

## 2023-04-24 DIAGNOSIS — M6281 Muscle weakness (generalized): Secondary | ICD-10-CM

## 2023-04-24 DIAGNOSIS — M25552 Pain in left hip: Secondary | ICD-10-CM | POA: Diagnosis not present

## 2023-04-24 NOTE — Therapy (Signed)
OUTPATIENT PHYSICAL THERAPY LOWER EXTREMITY Progess Note    Patient Name: Margaret Graham MRN: [id number] DOB:[birth year], 77 y.o., female Today's Date: 04/24/2023  END OF SESSION:  PT End of Session - 04/24/23 1353     Visit Number 11    Number of Visits 21    Date for PT Re-Evaluation 05/15/23    PT Start Time 1348    PT Stop Time 1430    PT Time Calculation (min) 42 min    Activity Tolerance Patient tolerated treatment well    Behavior During Therapy WFL for tasks assessed/performed              Past Medical History:  Diagnosis Date   Arthritis    GERD (gastroesophageal reflux disease)    Heart murmur    Hyperlipidemia    Hypothyroidism    Internal hemorrhoid    Schatzki's ring    Thyroid disease    Past Surgical History:  Procedure Laterality Date   ABDOMINAL HYSTERECTOMY     BREAST SURGERY     for dilated duct   GLUTEUS MINIMUS REPAIR Left 02/12/2023   Procedure: LEFT GLUTEUS MEDIUS REPAIR WITH POSSIBLE COLLAGEN PATCH PLACEMENT;  Surgeon: Huel Cote, MD;  Location: [facility];  Service: Orthopedics;  Laterality: Left;   MYRINGOTOMY WITH TUBE PLACEMENT Left 12/19/2022   Procedure: MYRINGOTOMY WITH TUBE PLACEMENT;  Surgeon: Laren Boom, DO;  Location: [facility];  Service: ENT;  Laterality: Left;   Patient Active Problem List   Diagnosis Date Noted   Tear of left gluteus medius tendon 02/12/2023   Strain of gluteus medius 02/06/2023   Chronic serous otitis media 12/19/2022   Osteopenia 01/23/2016   Dyslipidemia 01/18/2016   History of esophageal stricture 01/18/2016   Postoperative hypothyroidism 01/18/2016   Esophageal reflux 01/18/2016   Snoring 01/18/2016   Progress Note Reporting Period to4/18/2024 04/17/2023  See note below for Objective Data and Assessment of Progress/Goals.      Days since surgery: 31  PCP: Adrian Prince, MD   REFERRING PROVIDER: Huel Cote, MD   REFERRING DIAG:  [phone] (ICD-10-CM) -  Tear of left gluteus medius tendon, initial encounter      THERAPY DIAG:  Pain in left hip  Muscle weakness (generalized)  Difficulty walking  Rationale for Evaluation and Treatment: Rehabilitation  ONSET DATE: Days since surgery: Days since surgery: 71   PROCEDURE: 1. Left hip gluteus medius repair with collagen patch augmentation 2. Left hip trochanteric bursectomy  SUBJECTIVE:   SUBJECTIVE STATEMENT:  The back is improving but still painful. She has been walking without the cane.   Eval: Pt presents with spouse, [name].   Pt states she has had a long history with the L hip and back pain. Prior to surgery she had injections with some relief. Pt tried PT and TPDN at the time without resolution of pain.   Since surgery, pt has been using her rollator and has been compliant with PWB. Pt states that sitting still for long periods of time irritates the hip and makes it feel sore. Pt has been compliant with aspirin usage. Pt states that after surgery, the pain is very bad in the morning. Getting moving is hard. Pt has been using ice. Pt enjoys walking for exercise. Pt would walk about a mile at a time for exercise. Pt denies signs infection.   Pt is currently only 2 days post op.   PERTINENT HISTORY: LBP PAIN: 6/10 with activity  Pain location: low back pain  Pain description: aching  Aggravating factors: moving WB Relieving factors: rest,   PRECAUTIONS: Other: Hip Glute Med Repair  WEIGHT BEARING RESTRICTIONS: Yes TTWB  FALLS:  Has patient fallen in last 6 months? No  LIVING ENVIRONMENT: Lives with: lives with their family and lives with their spouse Lives in: House/apartment Stairs: Yes, 2 story Has following equipment at home: Environmental consultant - 4 wheeled and Wheelchair (manual)  OCCUPATION: retired  PLOF: Independent  PATIENT GOALS: Return to exercise and walking w/o pain  NEXT MD VISIT: 2 wk f/u  OBJECTIVE:   DIAGNOSTIC FINDINGS:  IMPRESSION: 1. Moderate  tendinosis and probable partial tearing of the left gluteus minimus tendon with associated muscular atrophy. Mild gluteus medius tendinosis, left greater than right. 2. Mild degenerative changes of both hips and sacroiliac joints. No acute osseous findings. 3. Chronic spondylosis in the visualized lower lumbar spine.    PATIENT SURVEYS:  FOTO 25 57 pts MCII 19 pts MCII   COGNITION: Overall cognitive status: Within functional limits for tasks assessed     SENSATION: WFL  POSTURE: rounded shoulders, increased thoracic kyphosis, and weight shift right  PALPATION: No TTP noted around the incision sight; aquacell bandage in place without signs of infection  LOWER EXTREMITY ROM:  Active ROM Right eval Left eval Left   Hip flexion WFL 90   Hip extension WFL 0   Hip abduction WFL N/A   Hip adduction WFL N/A   Hip internal rotation WFL N/A   Hip external rotation [facility] N/A   Knee flexion WFL 120   Knee extension WFL -5    (Blank rows = not tested)  LOWER EXTREMITY MMT: LOWER EXTREMITY MMT:    MMT Right eval Left eval  Hip flexion 15.5 13.5  Hip extension    Hip abduction 17.6 13.1  Hip adduction    Hip internal rotation    Hip external rotation    Knee flexion    Knee extension 23.3 28.2  Ankle dorsiflexion    Ankle plantarflexion    Ankle inversion    Ankle eversion     (Blank rows = not tested)   FUNCTIONAL TESTS:  FUNCTIONAL TESTS:  5 times sit to stand: single STS performed on single leg on L, UE support needed   GAIT: Distance walked: 79ft Assistive device utilized: able to walk RW Comments:  decreased L stance time, decreased R step length, limited hip extension bilat, step to pattern     TODAY'S TREATMENT:                                                                                                                              DATE:  6/26 Manual: Passive ROM within protocol limits. Per protocol therapy began ER stretching today. Trigger point  release to lateral glutes and IT band, trigger point release to the lumbar spine, reviewed self trigger point release to the lumbar spine.   PPT x20   SAQ 2x15  LAQ 2x10 yellow band  Hip abdcution 3x10 yellow  Step up 2 inch 2x10  Side step up 2 inch 2x10     6/20 Manual: Passive ROM within protocol limits. Per protocol therapy began ER stretching today. Trigger point release to lateral glutes and IT band, trigger point release to the lumbar spine, reviewed self trigger point release to the lumbar spine.   PPT x20  Ball stretch for lumbar fwd 5x 10 sec hold  Lateral 5x10sec hold bilateral   LAQ 2x10 yellow band  Hip abdcution 3x10 yellow  6/12 Manual: Passive ROM within protocol limits. Per protocol therapy began ER stretching today. Trigger point release to lateral glutes and IT band  PPT x20  Bridge 3x10   Step up 2 inch 2x10  Side step up 2 inch 2x10   LAQ 2x20 REP of 3 1.5 pounds will load more next visit       PATIENT EDUCATION:  Education details: precautions/protocol, AD usage, edema management, gait, cryotherapy, diagnosis, prognosis, anatomy, exercise progression, DOMS expectations, muscle firing,  envelope of function, HEP, POC   Person educated: Patient Education method: Explanation, Demonstration, Tactile cues, Verbal cues, and Handouts Education comprehension: verbalized understanding, returned demonstration, verbal cues required, and tactile cues required   HOME EXERCISE PROGRAM:  Access Code: [email or web address] URL: [email or web address] Date: 02/14/2023 Prepared by: Zebedee Iba  ASSESSMENT:  ASSESSMENT:   CLINICAL IMPRESSION: The patient continues to make progress. Her back is hurting less. She still has a significant trigger point. Therapy worked on annual therapy to reduce the spasm. We continue to progress exercises ad tolerated. She was advised to continue walking as much as able   OBJECTIVE IMPAIRMENTS Abnormal gait, decreased  activity tolerance, decreased endurance, decreased mobility, difficulty walking, decreased ROM, decreased strength, hypomobility, increased muscle spasms, impaired flexibility, improper body mechanics, postural dysfunction, and pain.    ACTIVITY LIMITATIONS carrying, lifting, bending, sitting, standing, squatting, sleeping, stairs, transfers, and locomotion level   PARTICIPATION LIMITATIONS: cleaning, laundry, interpersonal relationship, driving, shopping, community activity, occupation, and exercise   PERSONAL FACTORS Age, Fitness, Time since onset of injury/illness/exacerbation, and 3+ comorbidities:  are also affecting patient's functional outcome.    REHAB POTENTIAL: Fair     CLINICAL DECISION MAKING: Stable/uncomplicated   EVALUATION COMPLEXITY: Low     GOALS:     SHORT TERM GOALS: Target date: 03/28/2023        Pt will become independent with HEP in order to demonstrate synthesis of PT education.     Goal status: INITIAL   2.  Pt will be able to demonstrate full PROM on L hip in order to demonstrate functional improvement in LE function for progression to next phase of rehab.     Goal status: INITIAL   3.  Pt will score at least 19 pt increase on FOTO to demonstrate functional improvement in MCII and pt perceived function.      Goal status: INITIAL     LONG TERM GOALS: Target date: 05/09/2023       Pt  will become independent with final HEP in order to demonstrate synthesis of PT education.    Goal status: INITIAL   2.  Pt will be able to demonstrate full AROM of the L hip in all planes in order to demonstrate functional improvement in LE function for self-care and house hold duties.   Goal status: INITIAL    3  Pt will score >/= 57 on FOTO to demonstrate improvement in perceived L hip function.  INITIAL   4.  Pt will be able to demonstrate ability to squat to parallel without pain in order to demonstrate functional improvement in LE function for  self-care and house hold duties.    Goal status: INITIAL   5.  Pt will be able to demonstrate/report ability to walk >30 mins without pain in order to demonstrate functional improvement and tolerance to exercise and community mobility.    Goal status: INITIAL     PLAN: PT FREQUENCY: 1-2x/week   PT DURATION: 12 weeks   PLANNED INTERVENTIONS:  Continue to progress weight bearing and general strengthening per protocol    Dessie Coma, PT 04/24/2023, 3:35 PM

## 2023-04-25 ENCOUNTER — Encounter (HOSPITAL_BASED_OUTPATIENT_CLINIC_OR_DEPARTMENT_OTHER): Payer: Self-pay | Admitting: Physical Therapy

## 2023-04-30 DIAGNOSIS — H2513 Age-related nuclear cataract, bilateral: Secondary | ICD-10-CM | POA: Diagnosis not present

## 2023-05-08 DIAGNOSIS — E785 Hyperlipidemia, unspecified: Secondary | ICD-10-CM | POA: Diagnosis not present

## 2023-05-08 DIAGNOSIS — E041 Nontoxic single thyroid nodule: Secondary | ICD-10-CM | POA: Diagnosis not present

## 2023-05-08 DIAGNOSIS — M810 Age-related osteoporosis without current pathological fracture: Secondary | ICD-10-CM | POA: Diagnosis not present

## 2023-05-08 DIAGNOSIS — E039 Hypothyroidism, unspecified: Secondary | ICD-10-CM | POA: Diagnosis not present

## 2023-05-08 DIAGNOSIS — I700 Atherosclerosis of aorta: Secondary | ICD-10-CM | POA: Diagnosis not present

## 2023-05-09 ENCOUNTER — Ambulatory Visit (HOSPITAL_BASED_OUTPATIENT_CLINIC_OR_DEPARTMENT_OTHER): Payer: Medicare Other | Attending: Orthopaedic Surgery | Admitting: Physical Therapy

## 2023-05-09 ENCOUNTER — Encounter (HOSPITAL_BASED_OUTPATIENT_CLINIC_OR_DEPARTMENT_OTHER): Payer: Self-pay | Admitting: Physical Therapy

## 2023-05-09 DIAGNOSIS — M6281 Muscle weakness (generalized): Secondary | ICD-10-CM | POA: Diagnosis not present

## 2023-05-09 DIAGNOSIS — M25552 Pain in left hip: Secondary | ICD-10-CM | POA: Insufficient documentation

## 2023-05-09 DIAGNOSIS — R262 Difficulty in walking, not elsewhere classified: Secondary | ICD-10-CM | POA: Diagnosis not present

## 2023-05-09 NOTE — Therapy (Signed)
OUTPATIENT PHYSICAL THERAPY LOWER EXTREMITY Progess Note    Patient Name: Margaret Graham MRN: [id number] DOB:1946-07-15, 77 y.o., female Today's Date: 05/09/2023  END OF SESSION:  PT End of Session - 05/09/23 1147     Visit Number 12    Number of Visits 21    Date for PT Re-Evaluation 05/15/23    Authorization Type MCR progress note at 10    PT Start Time 1143    PT Stop Time 1228    PT Time Calculation (min) 45 min    Activity Tolerance Patient tolerated treatment well    Behavior During Therapy WFL for tasks assessed/performed              Past Medical History:  Diagnosis Date   Arthritis    GERD (gastroesophageal reflux disease)    Heart murmur    Hyperlipidemia    Hypothyroidism    Internal hemorrhoid    Schatzki's ring    Thyroid disease    Past Surgical History:  Procedure Laterality Date   ABDOMINAL HYSTERECTOMY     BREAST SURGERY     for dilated duct   GLUTEUS MINIMUS REPAIR Left 02/12/2023   Procedure: LEFT GLUTEUS MEDIUS REPAIR WITH POSSIBLE COLLAGEN PATCH PLACEMENT;  Surgeon: Huel Cote, MD;  Location: [facility];  Service: Orthopedics;  Laterality: Left;   MYRINGOTOMY WITH TUBE PLACEMENT Left 12/19/2022   Procedure: MYRINGOTOMY WITH TUBE PLACEMENT;  Surgeon: Laren Boom, DO;  Location: [facility];  Service: ENT;  Laterality: Left;   Patient Active Problem List   Diagnosis Date Noted   Tear of left gluteus medius tendon 02/12/2023   Strain of gluteus medius 02/06/2023   Chronic serous otitis media 12/19/2022   Osteopenia 01/23/2016   Dyslipidemia 01/18/2016   History of esophageal stricture 01/18/2016   Postoperative hypothyroidism 01/18/2016   Esophageal reflux 01/18/2016   Snoring 01/18/2016   Progress Note Reporting Period to4/18/2024 04/17/2023  See note below for Objective Data and Assessment of Progress/Goals.      Days since surgery: 35  PCP: Adrian Prince, MD   REFERRING PROVIDER: Huel Cote,  MD   REFERRING DIAG:  [id number] (ICD-10-CM) - Tear of left gluteus medius tendon, initial encounter      THERAPY DIAG:  Pain in left hip  Muscle weakness (generalized)  Difficulty walking  Rationale for Evaluation and Treatment: Rehabilitation  ONSET DATE: Days since surgery: Days since surgery: 86   PROCEDURE: 1. Left hip gluteus medius repair with collagen patch augmentation 2. Left hip trochanteric bursectomy  SUBJECTIVE:   SUBJECTIVE STATEMENT:  The back is improving but still painful. She has been walking without the cane.   Eval: Pt presents with spouse, [name].   Pt states she has had a long history with the L hip and back pain. Prior to surgery she had injections with some relief. Pt tried PT and TPDN at the time without resolution of pain.   Since surgery, pt has been using her rollator and has been compliant with PWB. Pt states that sitting still for long periods of time irritates the hip and makes it feel sore. Pt has been compliant with aspirin usage. Pt states that after surgery, the pain is very bad in the morning. Getting moving is hard. Pt has been using ice. Pt enjoys walking for exercise. Pt would walk about a mile at a time for exercise. Pt denies signs infection.   Pt is currently only 2 days post op.   PERTINENT HISTORY: LBP  PAIN: 6/10 with activity  Pain location: low back pain  Pain description: aching  Aggravating factors: moving WB Relieving factors: rest,   PRECAUTIONS: Other: Hip Glute Med Repair  WEIGHT BEARING RESTRICTIONS: Yes TTWB  FALLS:  Has patient fallen in last 6 months? No  LIVING ENVIRONMENT: Lives with: lives with their family and lives with their spouse Lives in: House/apartment Stairs: Yes, 2 story Has following equipment at home: Environmental consultant - 4 wheeled and Wheelchair (manual)  OCCUPATION: retired  PLOF: Independent  PATIENT GOALS: Return to exercise and walking w/o pain  NEXT MD VISIT: 2 wk f/u  OBJECTIVE:    DIAGNOSTIC FINDINGS:  IMPRESSION: 1. Moderate tendinosis and probable partial tearing of the left gluteus minimus tendon with associated muscular atrophy. Mild gluteus medius tendinosis, left greater than right. 2. Mild degenerative changes of both hips and sacroiliac joints. No acute osseous findings. 3. Chronic spondylosis in the visualized lower lumbar spine.    PATIENT SURVEYS:  FOTO 25 57 pts MCII 19 pts MCII   COGNITION: Overall cognitive status: Within functional limits for tasks assessed     SENSATION: WFL  POSTURE: rounded shoulders, increased thoracic kyphosis, and weight shift right  PALPATION: No TTP noted around the incision sight; aquacell bandage in place without signs of infection  LOWER EXTREMITY ROM:  Active ROM Right eval Left eval Left   Hip flexion WFL 90   Hip extension WFL 0   Hip abduction WFL N/A   Hip adduction WFL N/A   Hip internal rotation WFL N/A   Hip external rotation [facility] N/A   Knee flexion WFL 120   Knee extension WFL -5    (Blank rows = not tested)  LOWER EXTREMITY MMT: LOWER EXTREMITY MMT:    MMT Right eval Left eval  Hip flexion 15.5 13.5  Hip extension    Hip abduction 17.6 13.1  Hip adduction    Hip internal rotation    Hip external rotation    Knee flexion    Knee extension 23.3 28.2  Ankle dorsiflexion    Ankle plantarflexion    Ankle inversion    Ankle eversion     (Blank rows = not tested)   FUNCTIONAL TESTS:  FUNCTIONAL TESTS:  5 times sit to stand: single STS performed on single leg on L, UE support needed   GAIT: Distance walked: 64ft Assistive device utilized: able to walk RW Comments:  decreased L stance time, decreased R step length, limited hip extension bilat, step to pattern     TODAY'S TREATMENT:                                                                                                                              DATE:  7/11 Manual: Passive ROM within protocol limits. Per protocol  therapy began ER stretching today. Trigger point release to lateral glutes and IT band, trigger point release to the lumbar spine   SAQ 2x15   LAQ 2x15  yellow band  Hip abdcution 3x10 yellow  Heel raise x20        6/26 Manual: Passive ROM within protocol limits. Per protocol therapy began ER stretching today. Trigger point release to lateral glutes and IT band, trigger point release to the lumbar spine, reviewed self trigger point release to the lumbar spine.   PPT x20   SAQ 2x15   LAQ 2x10 yellow band  Hip abdcution 3x10 yellow  Step up 2 inch 2x10  Side step up 2 inch 2x10     6/20 Manual: Passive ROM within protocol limits. Per protocol therapy began ER stretching today. Trigger point release to lateral glutes and IT band, trigger point release to the lumbar spine, reviewed self trigger point release to the lumbar spine.   PPT x20  Ball stretch for lumbar fwd 5x 10 sec hold  Lateral 5x10sec hold bilateral   LAQ 2x10 yellow band  Hip abdcution 3x10 yellow  6/12 Manual: Passive ROM within protocol limits. Per protocol therapy began ER stretching today. Trigger point release to lateral glutes and IT band  PPT x20  Bridge 3x10   Step up 2 inch 2x10  Side step up 2 inch 2x10   LAQ 2x20 REP of 3 1.5 pounds will load more next visit       PATIENT EDUCATION:  Education details: precautions/protocol, AD usage, edema management, gait, cryotherapy, diagnosis, prognosis, anatomy, exercise progression, DOMS expectations, muscle firing,  envelope of function, HEP, POC   Person educated: Patient Education method: Explanation, Demonstration, Tactile cues, Verbal cues, and Handouts Education comprehension: verbalized understanding, returned demonstration, verbal cues required, and tactile cues required   HOME EXERCISE PROGRAM:  Access Code: [email or web address] URL: [email or web address] Date: 02/14/2023 Prepared by: Zebedee Iba  ASSESSMENT:  ASSESSMENT:    CLINICAL IMPRESSION: The patient was slightly more limited today. She had some pain following her pain and twist. We performed more manual therapy today. She had less soreness with manual therapy. We reviewed her home exercises. She was able to do her HEP without a significant increase in pain. She is using her cane. She was advised to use it until the pain starts to reduce.  OBJECTIVE IMPAIRMENTS Abnormal gait, decreased activity tolerance, decreased endurance, decreased mobility, difficulty walking, decreased ROM, decreased strength, hypomobility, increased muscle spasms, impaired flexibility, improper body mechanics, postural dysfunction, and pain.    ACTIVITY LIMITATIONS carrying, lifting, bending, sitting, standing, squatting, sleeping, stairs, transfers, and locomotion level   PARTICIPATION LIMITATIONS: cleaning, laundry, interpersonal relationship, driving, shopping, community activity, occupation, and exercise   PERSONAL FACTORS Age, Fitness, Time since onset of injury/illness/exacerbation, and 3+ comorbidities:  are also affecting patient's functional outcome.    REHAB POTENTIAL: Fair     CLINICAL DECISION MAKING: Stable/uncomplicated   EVALUATION COMPLEXITY: Low     GOALS:     SHORT TERM GOALS: Target date: 03/28/2023        Pt will become independent with HEP in order to demonstrate synthesis of PT education.     Goal status: INITIAL   2.  Pt will be able to demonstrate full PROM on L hip in order to demonstrate functional improvement in LE function for progression to next phase of rehab.     Goal status: INITIAL   3.  Pt will score at least 19 pt increase on FOTO to demonstrate functional improvement in MCII and pt perceived function.      Goal status: INITIAL     LONG TERM GOALS: Target date: 05/09/2023  Pt  will become independent with final HEP in order to demonstrate synthesis of PT education.    Goal status: INITIAL   2.  Pt will be able to  demonstrate full AROM of the L hip in all planes in order to demonstrate functional improvement in LE function for self-care and house hold duties.   Goal status: INITIAL    3  Pt will score >/= 57 on FOTO to demonstrate improvement in perceived L hip function.             INITIAL   4.  Pt will be able to demonstrate ability to squat to parallel without pain in order to demonstrate functional improvement in LE function for self-care and house hold duties.    Goal status: INITIAL   5.  Pt will be able to demonstrate/report ability to walk >30 mins without pain in order to demonstrate functional improvement and tolerance to exercise and community mobility.    Goal status: INITIAL     PLAN: PT FREQUENCY: 1-2x/week   PT DURATION: 12 weeks   PLANNED INTERVENTIONS:  Continue to progress weight bearing and general strengthening per protocol    Dessie Coma, PT 05/09/2023, 11:52 AM

## 2023-05-13 DIAGNOSIS — R82998 Other abnormal findings in urine: Secondary | ICD-10-CM | POA: Diagnosis not present

## 2023-05-14 DIAGNOSIS — Z5181 Encounter for therapeutic drug level monitoring: Secondary | ICD-10-CM | POA: Diagnosis not present

## 2023-05-14 DIAGNOSIS — M810 Age-related osteoporosis without current pathological fracture: Secondary | ICD-10-CM | POA: Diagnosis not present

## 2023-05-14 DIAGNOSIS — K219 Gastro-esophageal reflux disease without esophagitis: Secondary | ICD-10-CM | POA: Diagnosis not present

## 2023-05-14 DIAGNOSIS — E039 Hypothyroidism, unspecified: Secondary | ICD-10-CM | POA: Diagnosis not present

## 2023-05-15 DIAGNOSIS — R911 Solitary pulmonary nodule: Secondary | ICD-10-CM | POA: Diagnosis not present

## 2023-05-15 DIAGNOSIS — E785 Hyperlipidemia, unspecified: Secondary | ICD-10-CM | POA: Diagnosis not present

## 2023-05-15 DIAGNOSIS — E039 Hypothyroidism, unspecified: Secondary | ICD-10-CM | POA: Diagnosis not present

## 2023-05-15 DIAGNOSIS — Z1331 Encounter for screening for depression: Secondary | ICD-10-CM | POA: Diagnosis not present

## 2023-05-15 DIAGNOSIS — I700 Atherosclerosis of aorta: Secondary | ICD-10-CM | POA: Diagnosis not present

## 2023-05-15 DIAGNOSIS — E041 Nontoxic single thyroid nodule: Secondary | ICD-10-CM | POA: Diagnosis not present

## 2023-05-15 DIAGNOSIS — M810 Age-related osteoporosis without current pathological fracture: Secondary | ICD-10-CM | POA: Diagnosis not present

## 2023-05-15 DIAGNOSIS — Z0000 Encounter for general adult medical examination without abnormal findings: Secondary | ICD-10-CM | POA: Diagnosis not present

## 2023-05-15 DIAGNOSIS — D126 Benign neoplasm of colon, unspecified: Secondary | ICD-10-CM | POA: Diagnosis not present

## 2023-05-15 DIAGNOSIS — M899 Disorder of bone, unspecified: Secondary | ICD-10-CM | POA: Diagnosis not present

## 2023-05-15 DIAGNOSIS — Z1339 Encounter for screening examination for other mental health and behavioral disorders: Secondary | ICD-10-CM | POA: Diagnosis not present

## 2023-05-15 DIAGNOSIS — R0683 Snoring: Secondary | ICD-10-CM | POA: Diagnosis not present

## 2023-05-17 ENCOUNTER — Ambulatory Visit (HOSPITAL_BASED_OUTPATIENT_CLINIC_OR_DEPARTMENT_OTHER): Payer: Medicare Other | Admitting: Physical Therapy

## 2023-05-17 ENCOUNTER — Encounter (HOSPITAL_BASED_OUTPATIENT_CLINIC_OR_DEPARTMENT_OTHER): Payer: Self-pay | Admitting: Physical Therapy

## 2023-05-17 DIAGNOSIS — M25552 Pain in left hip: Secondary | ICD-10-CM

## 2023-05-17 DIAGNOSIS — M6281 Muscle weakness (generalized): Secondary | ICD-10-CM

## 2023-05-17 DIAGNOSIS — R262 Difficulty in walking, not elsewhere classified: Secondary | ICD-10-CM

## 2023-05-17 NOTE — Therapy (Signed)
OUTPATIENT PHYSICAL THERAPY LOWER EXTREMITY Progess Note    Patient Name: Margaret Graham MRN: [id number] DOB:Nov 13, 1945, 77 y.o., female Today's Date: 05/17/2023  END OF SESSION:  PT End of Session - 05/17/23 0854     Visit Number 13    Number of Visits 21    Date for PT Re-Evaluation 05/15/23    Authorization Type MCR progress note at 10    PT Start Time 0850    PT Stop Time 0935    PT Time Calculation (min) 45 min    Activity Tolerance Patient tolerated treatment well    Behavior During Therapy [facility] for tasks assessed/performed              Past Medical History:  Diagnosis Date   Arthritis    GERD (gastroesophageal reflux disease)    Heart murmur    Hyperlipidemia    Hypothyroidism    Internal hemorrhoid    Schatzki's ring    Thyroid disease    Past Surgical History:  Procedure Laterality Date   ABDOMINAL HYSTERECTOMY     BREAST SURGERY     for dilated duct   GLUTEUS MINIMUS REPAIR Left 02/12/2023   Procedure: LEFT GLUTEUS MEDIUS REPAIR WITH POSSIBLE COLLAGEN PATCH PLACEMENT;  Surgeon: Huel Cote, MD;  Location: [facility];  Service: Orthopedics;  Laterality: Left;   MYRINGOTOMY WITH TUBE PLACEMENT Left 12/19/2022   Procedure: MYRINGOTOMY WITH TUBE PLACEMENT;  Surgeon: Laren Boom, DO;  Location: [facility];  Service: ENT;  Laterality: Left;   Patient Active Problem List   Diagnosis Date Noted   Tear of left gluteus medius tendon 02/12/2023   Strain of gluteus medius 02/06/2023   Chronic serous otitis media 12/19/2022   Osteopenia 01/23/2016   Dyslipidemia 01/18/2016   History of esophageal stricture 01/18/2016   Postoperative hypothyroidism 01/18/2016   Esophageal reflux 01/18/2016   Snoring 01/18/2016   Progress Note Reporting Period to4/18/2024 04/17/2023  See note below for Objective Data and Assessment of Progress/Goals.      Days since surgery: 26  PCP: Adrian Prince, MD   REFERRING PROVIDER: Huel Cote,  MD   REFERRING DIAG:  [id number] (ICD-10-CM) - Tear of left gluteus medius tendon, initial encounter      THERAPY DIAG:  Pain in left hip  Muscle weakness (generalized)  Difficulty walking  Rationale for Evaluation and Treatment: Rehabilitation  ONSET DATE: Days since surgery: Days since surgery: 94   PROCEDURE: 1. Left hip gluteus medius repair with collagen patch augmentation 2. Left hip trochanteric bursectomy  SUBJECTIVE:   SUBJECTIVE STATEMENT:  The back is improving but still painful. She has been walking without the cane.   Eval: Pt presents with spouse, [name].   Pt states she has had a long history with the L hip and back pain. Prior to surgery she had injections with some relief. Pt tried PT and TPDN at the time without resolution of pain.   Since surgery, pt has been using her rollator and has been compliant with PWB. Pt states that sitting still for long periods of time irritates the hip and makes it feel sore. Pt has been compliant with aspirin usage. Pt states that after surgery, the pain is very bad in the morning. Getting moving is hard. Pt has been using ice. Pt enjoys walking for exercise. Pt would walk about a mile at a time for exercise. Pt denies signs infection.   Pt is currently only 2 days post op.   PERTINENT HISTORY: LBP  PAIN: 6/10 with activity  Pain location: low back pain  Pain description: aching  Aggravating factors: moving WB Relieving factors: rest,   PRECAUTIONS: Other: Hip Glute Med Repair  WEIGHT BEARING RESTRICTIONS: Yes TTWB  FALLS:  Has patient fallen in last 6 months? No  LIVING ENVIRONMENT: Lives with: lives with their family and lives with their spouse Lives in: House/apartment Stairs: Yes, 2 story Has following equipment at home: Environmental consultant - 4 wheeled and Wheelchair (manual)  OCCUPATION: retired  PLOF: Independent  PATIENT GOALS: Return to exercise and walking w/o pain  NEXT MD VISIT: 2 wk f/u  OBJECTIVE:    DIAGNOSTIC FINDINGS:  IMPRESSION: 1. Moderate tendinosis and probable partial tearing of the left gluteus minimus tendon with associated muscular atrophy. Mild gluteus medius tendinosis, left greater than right. 2. Mild degenerative changes of both hips and sacroiliac joints. No acute osseous findings. 3. Chronic spondylosis in the visualized lower lumbar spine.    PATIENT SURVEYS:  FOTO 25 57 pts MCII 19 pts MCII   COGNITION: Overall cognitive status: Within functional limits for tasks assessed     SENSATION: WFL  POSTURE: rounded shoulders, increased thoracic kyphosis, and weight shift right  PALPATION: No TTP noted around the incision sight; aquacell bandage in place without signs of infection  LOWER EXTREMITY ROM:  Active ROM Right eval Left eval Left   Hip flexion WFL 90   Hip extension WFL 0   Hip abduction WFL N/A   Hip adduction WFL N/A   Hip internal rotation WFL N/A   Hip external rotation [facility] N/A   Knee flexion WFL 120   Knee extension WFL -5    (Blank rows = not tested)  LOWER EXTREMITY MMT: LOWER EXTREMITY MMT:    MMT Right eval Left eval  Hip flexion 15.5 13.5  Hip extension    Hip abduction 17.6 13.1  Hip adduction    Hip internal rotation    Hip external rotation    Knee flexion    Knee extension 23.3 28.2  Ankle dorsiflexion    Ankle plantarflexion    Ankle inversion    Ankle eversion     (Blank rows = not tested)   FUNCTIONAL TESTS:  FUNCTIONAL TESTS:  5 times sit to stand: single STS performed on single leg on L, UE support needed   GAIT: Distance walked: 4ft Assistive device utilized: able to walk RW Comments:  decreased L stance time, decreased R step length, limited hip extension bilat, step to pattern     TODAY'S TREATMENT:                                                                                                                              DATE:  7/18 Manual: Passive ROM within protocol limits. Per protocol  therapy began ER stretching today. Trigger point release to lateral glutes and IT band, trigger point release to the lumbar spine   Low march 2x10  Hip abduction  red 2x10   Standing slow march 2x10  Mini squat 2x10    7/11 Manual: Passive ROM within protocol limits. Per protocol therapy began ER stretching today. Trigger point release to lateral glutes and IT band, trigger point release to the lumbar spine   SAQ 2x15   LAQ 2x15 yellow band  Hip abdcution 3x10 yellow  Heel raise x20   Slow march     6/26 Manual: Passive ROM within protocol limits. Per protocol therapy began ER stretching today. Trigger point release to lateral glutes and IT band, trigger point release to the lumbar spine, reviewed self trigger point release to the lumbar spine.   PPT x20   SAQ 2x15   LAQ 2x10 yellow band  Hip abdcution 3x10 yellow  Step up 2 inch 2x10  Side step up 2 inch 2x10     6/20 Manual: Passive ROM within protocol limits. Per protocol therapy began ER stretching today. Trigger point release to lateral glutes and IT band, trigger point release to the lumbar spine, reviewed self trigger point release to the lumbar spine.   PPT x20  Ball stretch for lumbar fwd 5x 10 sec hold  Lateral 5x10sec hold bilateral   LAQ 2x10 yellow band  Hip abdcution 3x10 yellow  6/12 Manual: Passive ROM within protocol limits. Per protocol therapy began ER stretching today. Trigger point release to lateral glutes and IT band  PPT x20  Bridge 3x10   Step up 2 inch 2x10  Side step up 2 inch 2x10   LAQ 2x20 REP of 3 1.5 pounds will load more next visit       PATIENT EDUCATION:  Education details: precautions/protocol, AD usage, edema management, gait, cryotherapy, diagnosis, prognosis, anatomy, exercise progression, DOMS expectations, muscle firing,  envelope of function, HEP, POC   Person educated: Patient Education method: Explanation, Demonstration, Tactile cues, Verbal cues, and  Handouts Education comprehension: verbalized understanding, returned demonstration, verbal cues required, and tactile cues required   HOME EXERCISE PROGRAM:  Access Code: [email or web address] URL: [email or web address] Date: 02/14/2023 Prepared by: Zebedee Iba  ASSESSMENT:  ASSESSMENT:   CLINICAL IMPRESSION: The patient continues to progress. We reviewed squat technique today. She had good technique with minimal cuing . We held on steps today because she needs to go shopping after her treatment.   OBJECTIVE IMPAIRMENTS Abnormal gait, decreased activity tolerance, decreased endurance, decreased mobility, difficulty walking, decreased ROM, decreased strength, hypomobility, increased muscle spasms, impaired flexibility, improper body mechanics, postural dysfunction, and pain.    ACTIVITY LIMITATIONS carrying, lifting, bending, sitting, standing, squatting, sleeping, stairs, transfers, and locomotion level   PARTICIPATION LIMITATIONS: cleaning, laundry, interpersonal relationship, driving, shopping, community activity, occupation, and exercise   PERSONAL FACTORS Age, Fitness, Time since onset of injury/illness/exacerbation, and 3+ comorbidities:  are also affecting patient's functional outcome.    REHAB POTENTIAL: Fair     CLINICAL DECISION MAKING: Stable/uncomplicated   EVALUATION COMPLEXITY: Low     GOALS:     SHORT TERM GOALS: Target date: 03/28/2023        Pt will become independent with HEP in order to demonstrate synthesis of PT education.     Goal status: INITIAL   2.  Pt will be able to demonstrate full PROM on L hip in order to demonstrate functional improvement in LE function for progression to next phase of rehab.     Goal status: INITIAL   3.  Pt will score at least 19 pt increase on FOTO to demonstrate functional improvement in MCII and pt  perceived function.      Goal status: INITIAL     LONG TERM GOALS: Target date: 05/09/2023       Pt  will become  independent with final HEP in order to demonstrate synthesis of PT education.    Goal status: INITIAL   2.  Pt will be able to demonstrate full AROM of the L hip in all planes in order to demonstrate functional improvement in LE function for self-care and house hold duties.   Goal status: INITIAL    3  Pt will score >/= 57 on FOTO to demonstrate improvement in perceived L hip function.             INITIAL   4.  Pt will be able to demonstrate ability to squat to parallel without pain in order to demonstrate functional improvement in LE function for self-care and house hold duties.    Goal status: INITIAL   5.  Pt will be able to demonstrate/report ability to walk >30 mins without pain in order to demonstrate functional improvement and tolerance to exercise and community mobility.    Goal status: INITIAL     PLAN: PT FREQUENCY: 1-2x/week   PT DURATION: 12 weeks   PLANNED INTERVENTIONS:  Continue to progress weight bearing and general strengthening per protocol    Dessie Coma, PT 05/17/2023, 9:10 AM

## 2023-05-23 ENCOUNTER — Ambulatory Visit (HOSPITAL_BASED_OUTPATIENT_CLINIC_OR_DEPARTMENT_OTHER): Payer: Medicare Other | Admitting: Physical Therapy

## 2023-05-23 DIAGNOSIS — R262 Difficulty in walking, not elsewhere classified: Secondary | ICD-10-CM | POA: Diagnosis not present

## 2023-05-23 DIAGNOSIS — M25552 Pain in left hip: Secondary | ICD-10-CM

## 2023-05-23 DIAGNOSIS — M6281 Muscle weakness (generalized): Secondary | ICD-10-CM | POA: Diagnosis not present

## 2023-05-24 ENCOUNTER — Encounter (HOSPITAL_BASED_OUTPATIENT_CLINIC_OR_DEPARTMENT_OTHER): Payer: Self-pay | Admitting: Physical Therapy

## 2023-05-24 NOTE — Therapy (Signed)
OUTPATIENT PHYSICAL THERAPY LOWER EXTREMITY Progess Note    Patient Name: Margaret Graham MRN: [id number] DOB:10-12-1946, 77 y.o., female Today's Date: 05/24/2023  END OF SESSION:  PT End of Session - 05/24/23 1007     Visit Number 14    Number of Visits 21    Date for PT Re-Evaluation 07/19/23    Authorization Type MCR progress note at 10    Activity Tolerance Patient tolerated treatment well    Behavior During Therapy [facility] for tasks assessed/performed              Past Medical History:  Diagnosis Date   Arthritis    GERD (gastroesophageal reflux disease)    Heart murmur    Hyperlipidemia    Hypothyroidism    Internal hemorrhoid    Schatzki's ring    Thyroid disease    Past Surgical History:  Procedure Laterality Date   ABDOMINAL HYSTERECTOMY     BREAST SURGERY     for dilated duct   GLUTEUS MINIMUS REPAIR Left 02/12/2023   Procedure: LEFT GLUTEUS MEDIUS REPAIR WITH POSSIBLE COLLAGEN PATCH PLACEMENT;  Surgeon: Huel Cote, MD;  Location: [facility];  Service: Orthopedics;  Laterality: Left;   MYRINGOTOMY WITH TUBE PLACEMENT Left 12/19/2022   Procedure: MYRINGOTOMY WITH TUBE PLACEMENT;  Surgeon: Laren Boom, DO;  Location: [facility];  Service: ENT;  Laterality: Left;   Patient Active Problem List   Diagnosis Date Noted   Tear of left gluteus medius tendon 02/12/2023   Strain of gluteus medius 02/06/2023   Chronic serous otitis media 12/19/2022   Osteopenia 01/23/2016   Dyslipidemia 01/18/2016   History of esophageal stricture 01/18/2016   Postoperative hypothyroidism 01/18/2016   Esophageal reflux 01/18/2016   Snoring 01/18/2016   Progress Note Reporting Period to4/18/2024 04/17/2023  See note below for Objective Data and Assessment of Progress/Goals.      Days since surgery: 100  PCP: Adrian Prince, MD   REFERRING PROVIDER: Huel Cote, MD   REFERRING DIAG:  S76.012A (ICD-10-CM) - Tear of left gluteus medius  tendon, initial encounter      THERAPY DIAG:  No diagnosis found.  Rationale for Evaluation and Treatment: Rehabilitation  ONSET DATE: Days since surgery: Days since surgery: 100   PROCEDURE: 1. Left hip gluteus medius repair with collagen patch augmentation 2. Left hip trochanteric bursectomy  SUBJECTIVE:   SUBJECTIVE STATEMENT:  The patient continues to have back pain.  Her back at this point is a bigger issue than her hip.  She keeps a cane with her outside but for the most part she has been using her cane she is very little hip pain.  She has been working on her back some.  She has not done her exercises quite as much as she would like.  Eval: Pt presents with spouse, [name].   Pt states she has had a long history with the L hip and back pain. Prior to surgery she had injections with some relief. Pt tried PT and TPDN at the time without resolution of pain.   Since surgery, pt has been using her rollator and has been compliant with PWB. Pt states that sitting still for long periods of time irritates the hip and makes it feel sore. Pt has been compliant with aspirin usage. Pt states that after surgery, the pain is very bad in the morning. Getting moving is hard. Pt has been using ice. Pt enjoys walking for exercise. Pt would walk about a mile at a  time for exercise. Pt denies signs infection.   Pt is currently only 2 days post op.   PERTINENT HISTORY: LBP PAIN: 6/10 with activity  Pain location: low back pain  Pain description: aching  Aggravating factors: moving WB Relieving factors: rest,   PRECAUTIONS: Other: Hip Glute Med Repair  WEIGHT BEARING RESTRICTIONS: Yes TTWB  FALLS:  Has patient fallen in last 6 months? No  LIVING ENVIRONMENT: Lives with: lives with their family and lives with their spouse Lives in: House/apartment Stairs: Yes, 2 story Has following equipment at home: Environmental consultant - 4 wheeled and Wheelchair (manual)  OCCUPATION: retired  PLOF:  Independent  PATIENT GOALS: Return to exercise and walking w/o pain  NEXT MD VISIT: 2 wk f/u  OBJECTIVE:   DIAGNOSTIC FINDINGS:  IMPRESSION: 1. Moderate tendinosis and probable partial tearing of the left gluteus minimus tendon with associated muscular atrophy. Mild gluteus medius tendinosis, left greater than right. 2. Mild degenerative changes of both hips and sacroiliac joints. No acute osseous findings. 3. Chronic spondylosis in the visualized lower lumbar spine.    PATIENT SURVEYS:  FOTO 25 57 pts MCII 19 pts MCII   COGNITION: Overall cognitive status: Within functional limits for tasks assessed     SENSATION: WFL  POSTURE: rounded shoulders, increased thoracic kyphosis, and weight shift right  PALPATION: No TTP noted around the incision sight; aquacell bandage in place without signs of infection  LOWER EXTREMITY ROM:  Active ROM Right eval Left eval Left   Hip flexion WFL 90   Hip extension WFL 0   Hip abduction WFL N/A   Hip adduction WFL N/A   Hip internal rotation WFL N/A   Hip external rotation [facility] N/A   Knee flexion WFL 120   Knee extension WFL -5    (Blank rows = not tested)  LOWER EXTREMITY MMT: LOWER EXTREMITY MMT:    MMT Right eval Left eval  Hip flexion 15.5 13.5  Hip extension    Hip abduction 17.6 13.1  Hip adduction    Hip internal rotation    Hip external rotation    Knee flexion    Knee extension 23.3 28.2  Ankle dorsiflexion    Ankle plantarflexion    Ankle inversion    Ankle eversion     (Blank rows = not tested)   FUNCTIONAL TESTS:  FUNCTIONAL TESTS:  5 times sit to stand: single STS performed on single leg on L, UE support needed   GAIT: Distance walked: 45ft Assistive device utilized: able to walk RW Comments:  decreased L stance time, decreased R step length, limited hip extension bilat, step to pattern     TODAY'S TREATMENT:                                                                                                                               DATE:  7/26 Manual: Passive ROM within protocol limits. Per protocol therapy began ER stretching today. Trigger point  release to lateral glutes and IT band and lumbar spine, trigger point release to the lumbar spine   SAQ 2x15   LAQ 2x15 yellow band  Hip abdcution 3x10 yellow  Heel raise x20   Slow march 3x10    7/18 Manual: Passive ROM within protocol limits. Per protocol therapy began ER stretching today. Trigger point release to lateral glutes and IT band, trigger point release to the lumbar spine   Low march 2x10  Hip abduction red 2x10   Standing slow march 2x10  Mini squat 2x10    7/11 Manual: Passive ROM within protocol limits. Per protocol therapy began ER stretching today. Trigger point release to lateral glutes and IT band, trigger point release to the lumbar spine   SAQ 2x15   LAQ 2x15 yellow band  Hip abdcution 3x10 yellow  Heel raise x20   Slow march     6/26 Manual: Passive ROM within protocol limits. Per protocol therapy began ER stretching today. Trigger point release to lateral glutes and IT band, trigger point release to the lumbar spine, reviewed self trigger point release to the lumbar spine.   PPT x20   SAQ 2x15   LAQ 2x10 yellow band  Hip abdcution 3x10 yellow  Step up 2 inch 2x10  Side step up 2 inch 2x10     6/20 Manual: Passive ROM within protocol limits. Per protocol therapy began ER stretching today. Trigger point release to lateral glutes and IT band, trigger point release to the lumbar spine, reviewed self trigger point release to the lumbar spine.   PPT x20  Ball stretch for lumbar fwd 5x 10 sec hold  Lateral 5x10sec hold bilateral   LAQ 2x10 yellow band  Hip abdcution 3x10 yellow  6/12 Manual: Passive ROM within protocol limits. Per protocol therapy began ER stretching today. Trigger point release to lateral glutes and IT band  PPT x20  Bridge 3x10   Step up 2 inch 2x10  Side  step up 2 inch 2x10   LAQ 2x20 REP of 3 1.5 pounds will load more next visit       PATIENT EDUCATION:  Education details: precautions/protocol, AD usage, edema management, gait, cryotherapy, diagnosis, prognosis, anatomy, exercise progression, DOMS expectations, muscle firing,  envelope of function, HEP, POC   Person educated: Patient Education method: Explanation, Demonstration, Tactile cues, Verbal cues, and Handouts Education comprehension: verbalized understanding, returned demonstration, verbal cues required, and tactile cues required   HOME EXERCISE PROGRAM:  Access Code: [email or web address] URL: [email or web address] Date: 02/14/2023 Prepared by: Zebedee Iba  ASSESSMENT:  ASSESSMENT:   CLINICAL IMPRESSION: The patient continues to make progress. Her back pain has been the limiting factor.  She has had very little pain in her hip. Per her numbers from her progress note, her strength is improving and her ability to ambulate is improving. She would benefit from continued skilled therapy [name] Kitchen She tolerated there-ex well today. We focused on trigger point release to the lumbar spine.  OBJECTIVE IMPAIRMENTS Abnormal gait, decreased activity tolerance, decreased endurance, decreased mobility, difficulty walking, decreased ROM, decreased strength, hypomobility, increased muscle spasms, impaired flexibility, improper body mechanics, postural dysfunction, and pain.    ACTIVITY LIMITATIONS carrying, lifting, bending, sitting, standing, squatting, sleeping, stairs, transfers, and locomotion level   PARTICIPATION LIMITATIONS: cleaning, laundry, interpersonal relationship, driving, shopping, community activity, occupation, and exercise   PERSONAL FACTORS Age, Fitness, Time since onset of injury/illness/exacerbation, and 3+ comorbidities:  are also affecting patient's functional outcome.    REHAB POTENTIAL: Fair  CLINICAL DECISION MAKING: Stable/uncomplicated   EVALUATION  COMPLEXITY: Low     GOALS:     SHORT TERM GOALS: Target date: 03/28/2023        Pt will become independent with HEP in order to demonstrate synthesis of PT education.     Goal status: Patient independent with basic HEP at this time achieved 7/26   2.  Pt will be able to demonstrate full PROM on L hip in order to demonstrate functional improvement in LE function for progression to next phase of rehab.     Goal status: Mild limitations in left hip external rotation progressing 7/26   3.  Pt will score at least 19 pt increase on FOTO to demonstrate functional improvement in MCII and pt perceived function.      Goal status: INITIAL     LONG TERM GOALS: Target date: Foto goal achieved 7/26       Pt  will become independent with final HEP in order to demonstrate synthesis of PT education.    Goal status: Continue to expand HEP for   2.  Pt will be able to demonstrate full AROM of the L hip in all planes in order to demonstrate functional improvement in LE function for self-care and house hold duties.   Goal status: Still limitations in external rotation 7/26    3  Pt will score >/= 57 on FOTO to demonstrate improvement in perceived L hip function.           Reach Foto goal achieved   4.  Pt will be able to demonstrate ability to squat to parallel without pain in order to demonstrate functional improvement in LE function for self-care and house hold duties.    Goal status: Squatting improving progressing 7/26   5.  Pt will be able to demonstrate/report ability to walk >30 mins without pain in order to demonstrate functional improvement and tolerance to exercise and community mobility.    Goal status: Community distance ambulation improving 7/26     PLAN: PT FREQUENCY: 1-2x/week   PT DURATION: 12 weeks   PLANNED INTERVENTIONS:  Continue to progress weight bearing and general strengthening per protocol    Dessie Coma, PT 05/24/2023, 1:07 PM

## 2023-06-03 ENCOUNTER — Ambulatory Visit (INDEPENDENT_AMBULATORY_CARE_PROVIDER_SITE_OTHER): Payer: Medicare Other | Admitting: Orthopaedic Surgery

## 2023-06-03 DIAGNOSIS — S76012A Strain of muscle, fascia and tendon of left hip, initial encounter: Secondary | ICD-10-CM | POA: Diagnosis not present

## 2023-06-03 NOTE — Progress Notes (Signed)
Post Operative Evaluation    Procedure/Date of Surgery: Left gluteus medius repair 02/12/23  Interval History:    Presents today 3-1/2 months status post left hip gluteus medius repair.  She is making steady progress.  She has been having some back pain particularly on this left side.   PMH/PSH/Family History/Social History/Meds/Allergies:    Past Medical History:  Diagnosis Date   Arthritis    GERD (gastroesophageal reflux disease)    Heart murmur    Hyperlipidemia    Hypothyroidism    Internal hemorrhoid    Schatzki's ring    Thyroid disease    Past Surgical History:  Procedure Laterality Date   ABDOMINAL HYSTERECTOMY     BREAST SURGERY     for dilated duct   GLUTEUS MINIMUS REPAIR Left 02/12/2023   Procedure: LEFT GLUTEUS MEDIUS REPAIR WITH POSSIBLE COLLAGEN PATCH PLACEMENT;  Surgeon: Huel Cote, MD;  Location: [facility];  Service: Orthopedics;  Laterality: Left;   MYRINGOTOMY WITH TUBE PLACEMENT Left 12/19/2022   Procedure: MYRINGOTOMY WITH TUBE PLACEMENT;  Surgeon: Laren Boom, DO;  Location: [facility];  Service: ENT;  Laterality: Left;   Social History   Socioeconomic History   Marital status: Married    Spouse name: Not on file   Number of children: 2   Years of education: Not on file   Highest education level: Not on file  Occupational History   Not on file  Tobacco Use   Smoking status: Never   Smokeless tobacco: Never  Vaping Use   Vaping status: Never Used  Substance and Sexual Activity   Alcohol use: Yes    Alcohol/week: 1.0 standard drink of alcohol    Types: 1 Glasses of wine per week    Comment: 1 glass a day   Drug use: No   Sexual activity: Not Currently    Partners: Male  Other Topics Concern   Not on file  Social History Narrative   Retired from [employer].   Moved to [facility] 07/2015.   From [place].   Social Determinants of Health   Financial Resource Strain: Not on file  Food  Insecurity: Not on file  Transportation Needs: Not on file  Physical Activity: Not on file  Stress: Not on file  Social Connections: Not on file   Family History  Problem Relation Age of Onset   Arthritis Mother    Arthritis Father    Hyperlipidemia Father    Diabetes Son    Asthma Son    Heart disease Paternal Uncle    Colon cancer Neg Hx    Esophageal cancer Neg Hx    Rectal cancer Neg Hx    Allergies  Allergen Reactions   Declomycin [Demeclocycline]    Current Outpatient Medications  Medication Sig Dispense Refill   alendronate (FOSAMAX) 70 MG tablet Take 70 mg by mouth once a week. Take with a full glass of water on an empty stomach.     Ascorbic Acid (VITAMIN C) 1000 MG tablet Take 1,000 mg by mouth daily.     aspirin EC 325 MG tablet Take 1 tablet (325 mg total) by mouth daily. 30 tablet 0   Calcium Carb-Cholecalciferol (CALCIUM 600 + D PO) Take by mouth.     Carboxymethylcellulose Sodium (REFRESH PLUS OP) Place 1 drop into both eyes  daily. PF     Cholecalciferol (VITAMIN D3) 25 MCG (1000 UT) capsule Take 1,000 Units by mouth daily.     docusate sodium (COLACE) 100 MG capsule Take 200 mg by mouth at bedtime.     ezetimibe (ZETIA) 10 MG tablet Take 10 mg by mouth daily.     famotidine (PEPCID) 40 MG tablet Take 1 tablet (40 mg total) by mouth at bedtime. 90 tablet 0   fluticasone (FLONASE) 50 MCG/ACT nasal spray Place 2 sprays into both nostrils in the morning.     levothyroxine (SYNTHROID, LEVOTHROID) 100 MCG tablet Take 1 tablet (100 mcg total) by mouth every morning. 30 tablet 3   MAGNESIUM PO Take by mouth at bedtime.     Multiple Vitamin (MULTIVITAMIN) capsule Take 1 capsule by mouth daily.     Multiple Vitamins-Minerals (PRESERVISION AREDS 2) CAPS Take 1 tablet by mouth 2 (two) times daily.     ondansetron (ZOFRAN-ODT) 8 MG disintegrating tablet Take 1 tablet (8 mg total) by mouth every 8 (eight) hours as needed for nausea or vomiting. 10 tablet 0   oxyCODONE (OXY  IR/ROXICODONE) 5 MG immediate release tablet Take 1 tablet (5 mg total) by mouth every 4 (four) hours as needed (severe pain). 20 tablet 0   simvastatin (ZOCOR) 20 MG tablet Take 1 tablet (20 mg total) by mouth daily. 30 tablet 0   zinc gluconate 50 MG tablet Take 50 mg by mouth daily.     Current Facility-Administered Medications  Medication Dose Route Frequency Provider Last Rate Last Admin   0.9 %  sodium chloride infusion  500 mL Intravenous Once Charlie Pitter III, MD       No results found.  Review of Systems:   A ROS was performed including pertinent positives and negatives as documented in the HPI.   Musculoskeletal Exam:    There were no vitals taken for this visit.  Incision is well-appearing without erythema or drainage.  She has some tenderness about the quadriceps and the vastus lateralis.  No tenderness about the lateral trochanter.  There is some weakness with resisted abduction.  She is walking with a mildly antalgic gait.  Remainder of distal neurosensory exam is intact  Imaging:      I personally reviewed and interpreted the radiographs.   Assessment:   3.5 months status post left hip gluteus medius repair overall doing well.  At this time she has been having some soreness in the back and is somewhat plateauing and to that effect I do believe she would benefit greatly from aquatic therapy in the pool.  I like to see her back in 3 months for likely final check  Plan :    -Return to clinic in 12 weeks for reassessment      I personally saw and evaluated the patient, and participated in the management and treatment plan.  Huel Cote, MD Attending Physician, Orthopedic Surgery  This document was dictated using Dragon voice recognition software. A reasonable attempt at proof reading has been made to minimize errors.

## 2023-06-07 ENCOUNTER — Ambulatory Visit (HOSPITAL_BASED_OUTPATIENT_CLINIC_OR_DEPARTMENT_OTHER): Payer: Medicare Other | Attending: Orthopaedic Surgery | Admitting: Physical Therapy

## 2023-06-07 ENCOUNTER — Encounter (HOSPITAL_BASED_OUTPATIENT_CLINIC_OR_DEPARTMENT_OTHER): Payer: Self-pay | Admitting: Physical Therapy

## 2023-06-07 DIAGNOSIS — M25552 Pain in left hip: Secondary | ICD-10-CM | POA: Insufficient documentation

## 2023-06-07 DIAGNOSIS — R262 Difficulty in walking, not elsewhere classified: Secondary | ICD-10-CM | POA: Insufficient documentation

## 2023-06-07 DIAGNOSIS — M6281 Muscle weakness (generalized): Secondary | ICD-10-CM | POA: Diagnosis not present

## 2023-06-07 NOTE — Therapy (Signed)
OUTPATIENT PHYSICAL THERAPY LOWER EXTREMITY Progess Note    Patient Name: Margaret Graham MRN: [id number] DOB:[birth year], 77 y.o., female Today's Date: 06/07/2023  END OF SESSION:  PT End of Session - 06/07/23 1420     Visit Number 15    Number of Visits 21    Date for PT Re-Evaluation 07/19/23    Authorization Type MCR progress note at 10    PT Start Time 1345    PT Stop Time 1425    PT Time Calculation (min) 40 min    Activity Tolerance Patient tolerated treatment well    Behavior During Therapy WFL for tasks assessed/performed              Past Medical History:  Diagnosis Date   Arthritis    GERD (gastroesophageal reflux disease)    Heart murmur    Hyperlipidemia    Hypothyroidism    Internal hemorrhoid    Schatzki's ring    Thyroid disease    Past Surgical History:  Procedure Laterality Date   ABDOMINAL HYSTERECTOMY     BREAST SURGERY     for dilated duct   GLUTEUS MINIMUS REPAIR Left 02/12/2023   Procedure: LEFT GLUTEUS MEDIUS REPAIR WITH POSSIBLE COLLAGEN PATCH PLACEMENT;  Surgeon: Huel Cote, MD;  [facility];  Service: Orthopedics;  Laterality: Left;   MYRINGOTOMY WITH TUBE PLACEMENT Left 12/19/2022   Procedure: MYRINGOTOMY WITH TUBE PLACEMENT;  Surgeon: Laren Boom, DO;  Location: [facility];  Service: ENT;  Laterality: Left;   Patient Active Problem List   Diagnosis Date Noted   Tear of left gluteus medius tendon 02/12/2023   Strain of gluteus medius 02/06/2023   Chronic serous otitis media 12/19/2022   Osteopenia 01/23/2016   Dyslipidemia 01/18/2016   History of esophageal stricture 01/18/2016   Postoperative hypothyroidism 01/18/2016   Esophageal reflux 01/18/2016   Snoring 01/18/2016   Progress Note Reporting Period to4/18/2024 04/17/2023  See note below for Objective Data and Assessment of Progress/Goals.      Days since surgery: 115  PCP: Adrian Prince, MD   REFERRING PROVIDER: Huel Cote,  MD   REFERRING DIAG:  [phone] (ICD-10-CM) - Tear of left gluteus medius tendon, initial encounter      THERAPY DIAG:  Pain in left hip  Muscle weakness (generalized)  Difficulty walking  Rationale for Evaluation and Treatment: Rehabilitation  ONSET DATE: Days since surgery: Days since surgery: 115   PROCEDURE: 1. Left hip gluteus medius repair with collagen patch augmentation 2. Left hip trochanteric bursectomy  SUBJECTIVE:   SUBJECTIVE STATEMENT:  The patient continues to have back pain.  Her back at this point is a bigger issue than her hip.  She keeps a cane with her outside but for the most part she has been using her cane she is very little hip pain.  She has been working on her back some.  She has not done her exercises quite as much as she would like.  Eval: Pt presents with spouse, [name].   Pt states she has had a long history with the L hip and back pain. Prior to surgery she had injections with some relief. Pt tried PT and TPDN at the time without resolution of pain.   Since surgery, pt has been using her rollator and has been compliant with PWB. Pt states that sitting still for long periods of time irritates the hip and makes it feel sore. Pt has been compliant with aspirin usage. Pt states that after  surgery, the pain is very bad in the morning. Getting moving is hard. Pt has been using ice. Pt enjoys walking for exercise. Pt would walk about a mile at a time for exercise. Pt denies signs infection.   Pt is currently only 2 days post op.   PERTINENT HISTORY: LBP PAIN: 6/10 with activity  Pain location: low back pain  Pain description: aching  Aggravating factors: moving WB Relieving factors: rest,   PRECAUTIONS: Other: Hip Glute Med Repair  WEIGHT BEARING RESTRICTIONS: Yes TTWB  FALLS:  Has patient fallen in last 6 months? No  LIVING ENVIRONMENT: Lives with: lives with their family and lives with their spouse Lives in: House/apartment Stairs: Yes, 2  story Has following equipment at home: Environmental consultant - 4 wheeled and Wheelchair (manual)  OCCUPATION: retired  PLOF: Independent  PATIENT GOALS: Return to exercise and walking w/o pain  NEXT MD VISIT: 2 wk f/u  OBJECTIVE:   DIAGNOSTIC FINDINGS:  IMPRESSION: 1. Moderate tendinosis and probable partial tearing of the left gluteus minimus tendon with associated muscular atrophy. Mild gluteus medius tendinosis, left greater than right. 2. Mild degenerative changes of both hips and sacroiliac joints. No acute osseous findings. 3. Chronic spondylosis in the visualized lower lumbar spine.    PATIENT SURVEYS:  FOTO 25 57 pts MCII 19 pts MCII   COGNITION: Overall cognitive status: Within functional limits for tasks assessed     SENSATION: WFL  POSTURE: rounded shoulders, increased thoracic kyphosis, and weight shift right  PALPATION: No TTP noted around the incision sight; aquacell bandage in place without signs of infection  LOWER EXTREMITY ROM:  Active ROM Right eval Left eval Left   Hip flexion WFL 90   Hip extension WFL 0   Hip abduction WFL N/A   Hip adduction WFL N/A   Hip internal rotation WFL N/A   Hip external rotation [employer] N/A   Knee flexion WFL 120   Knee extension WFL -5    (Blank rows = not tested)  LOWER EXTREMITY MMT: LOWER EXTREMITY MMT:    MMT Right eval Left eval  Hip flexion 15.5 13.5  Hip extension    Hip abduction 17.6 13.1  Hip adduction    Hip internal rotation    Hip external rotation    Knee flexion    Knee extension 23.3 28.2  Ankle dorsiflexion    Ankle plantarflexion    Ankle inversion    Ankle eversion     (Blank rows = not tested)   FUNCTIONAL TESTS:  FUNCTIONAL TESTS:  5 times sit to stand: single STS performed on single leg on L, UE support needed   GAIT: Distance walked: 43ft Assistive device utilized: able to walk RW Comments:  decreased L stance time, decreased R step length, limited hip extension bilat, step to  pattern     TODAY'S TREATMENT:  DATE:  8/9 Manual: Passive ROM within protocol limits. Per protocol therapy began ER stretching today. Trigger point release to lateral glutes and IT band and lumbar spine, trigger point release to the lumbar spine   SAQ 2x15 2lbs  LAQ 2x15 2lbs  Standing hip abduction left 2x10  Standing hip extension left 2x10   Step up 4 inch 2x10    7/26 Manual: Passive ROM within protocol limits. Per protocol therapy began ER stretching today. Trigger point release to lateral glutes and IT band and lumbar spine, trigger point release to the lumbar spine   SAQ 2x15   LAQ 2x15 yellow band  Hip abdcution 3x10 yellow  Heel raise x20   Slow march 3x10    7/18 Manual: Passive ROM within protocol limits. Per protocol therapy began ER stretching today. Trigger point release to lateral glutes and IT band, trigger point release to the lumbar spine   Low march 2x10  Hip abduction red 2x10   Standing slow march 2x10  Mini squat 2x10    7/11 Manual: Passive ROM within protocol limits. Per protocol therapy began ER stretching today. Trigger point release to lateral glutes and IT band, trigger point release to the lumbar spine   SAQ 2x15   LAQ 2x15 yellow band  Hip abdcution 3x10 yellow  Heel raise x20   Slow march     6/26 Manual: Passive ROM within protocol limits. Per protocol therapy began ER stretching today. Trigger point release to lateral glutes and IT band, trigger point release to the lumbar spine, reviewed self trigger point release to the lumbar spine.   PPT x20   SAQ 2x15   LAQ 2x10 yellow band  Hip abdcution 3x10 yellow  Step up 2 inch 2x10  Side step up 2 inch 2x10     6/20 Manual: Passive ROM within protocol limits. Per protocol therapy began ER stretching today. Trigger point release to lateral glutes  and IT band, trigger point release to the lumbar spine, reviewed self trigger point release to the lumbar spine.   PPT x20  Ball stretch for lumbar fwd 5x 10 sec hold  Lateral 5x10sec hold bilateral   LAQ 2x10 yellow band  Hip abdcution 3x10 yellow  6/12 Manual: Passive ROM within protocol limits. Per protocol therapy began ER stretching today. Trigger point release to lateral glutes and IT band  PPT x20  Bridge 3x10   Step up 2 inch 2x10  Side step up 2 inch 2x10   LAQ 2x20 REP of 3 1.5 pounds will load more next visit       PATIENT EDUCATION:  Education details: precautions/protocol, AD usage, edema management, gait, cryotherapy, diagnosis, prognosis, anatomy, exercise progression, DOMS expectations, muscle firing,  envelope of function, HEP, POC   Person educated: Patient Education method: Explanation, Demonstration, Tactile cues, Verbal cues, and Handouts Education comprehension: verbalized understanding, returned demonstration, verbal cues required, and tactile cues required   HOME EXERCISE PROGRAM:  Access Code: [email or web address] URL: [email or web address] Date: 02/14/2023 Prepared by: Zebedee Iba  ASSESSMENT:  ASSESSMENT:   CLINICAL IMPRESSION: The patient tolerated treatment well. She feels like she is back tracking but she had much less spasming in her glut and low back today. We will work on getting her into the pool. We worked on a standing hip series with her today with the left but she had pain in her right. She was encouraged to continue her exercises at home.    OBJECTIVE IMPAIRMENTS Abnormal gait, decreased activity tolerance, decreased endurance,  decreased mobility, difficulty walking, decreased ROM, decreased strength, hypomobility, increased muscle spasms, impaired flexibility, improper body mechanics, postural dysfunction, and pain.    ACTIVITY LIMITATIONS carrying, lifting, bending, sitting, standing, squatting, sleeping, stairs, transfers, and  locomotion level   PARTICIPATION LIMITATIONS: cleaning, laundry, interpersonal relationship, driving, shopping, community activity, occupation, and exercise   PERSONAL FACTORS Age, Fitness, Time since onset of injury/illness/exacerbation, and 3+ comorbidities:  are also affecting patient's functional outcome.    REHAB POTENTIAL: Fair     CLINICAL DECISION MAKING: Stable/uncomplicated   EVALUATION COMPLEXITY: Low     GOALS:     SHORT TERM GOALS: Target date: 03/28/2023        Pt will become independent with HEP in order to demonstrate synthesis of PT education.     Goal status: Patient independent with basic HEP at this time achieved 7/26   2.  Pt will be able to demonstrate full PROM on L hip in order to demonstrate functional improvement in LE function for progression to next phase of rehab.     Goal status: Mild limitations in left hip external rotation progressing 7/26   3.  Pt will score at least 19 pt increase on FOTO to demonstrate functional improvement in MCII and pt perceived function.      Goal status: INITIAL     LONG TERM GOALS: Target date: Foto goal achieved 7/26       Pt  will become independent with final HEP in order to demonstrate synthesis of PT education.    Goal status: Continue to expand HEP for   2.  Pt will be able to demonstrate full AROM of the L hip in all planes in order to demonstrate functional improvement in LE function for self-care and house hold duties.   Goal status: Still limitations in external rotation 7/26    3  Pt will score >/= 57 on FOTO to demonstrate improvement in perceived L hip function.           Reach Foto goal achieved   4.  Pt will be able to demonstrate ability to squat to parallel without pain in order to demonstrate functional improvement in LE function for self-care and house hold duties.    Goal status: Squatting improving progressing 7/26   5.  Pt will be able to demonstrate/report ability to walk >30  mins without pain in order to demonstrate functional improvement and tolerance to exercise and community mobility.    Goal status: Community distance ambulation improving 7/26     PLAN: PT FREQUENCY: 1-2x/week   PT DURATION: 12 weeks   PLANNED INTERVENTIONS:  Continue to progress weight bearing and general strengthening per protocol    Dessie Coma, PT 06/07/2023, 3:03 PM

## 2023-06-08 ENCOUNTER — Encounter (HOSPITAL_BASED_OUTPATIENT_CLINIC_OR_DEPARTMENT_OTHER): Payer: Self-pay | Admitting: Physical Therapy

## 2023-06-13 ENCOUNTER — Ambulatory Visit (HOSPITAL_BASED_OUTPATIENT_CLINIC_OR_DEPARTMENT_OTHER): Payer: Medicare Other | Admitting: Physical Therapy

## 2023-06-13 DIAGNOSIS — M25552 Pain in left hip: Secondary | ICD-10-CM | POA: Diagnosis not present

## 2023-06-13 DIAGNOSIS — R262 Difficulty in walking, not elsewhere classified: Secondary | ICD-10-CM | POA: Diagnosis not present

## 2023-06-13 DIAGNOSIS — M6281 Muscle weakness (generalized): Secondary | ICD-10-CM

## 2023-06-13 NOTE — Therapy (Signed)
OUTPATIENT PHYSICAL THERAPY LOWER EXTREMITY TREATMENT NOTE   Patient Name: Margaret Graham MRN: [id number] DOB:1946-10-22, 77 y.o., female Today's Date: 06/14/2023  END OF SESSION:  PT End of Session - 06/13/23 1406     Visit Number 16    Number of Visits 21    Date for PT Re-Evaluation 07/19/23    PT Start Time 1404    PT Stop Time 1445    PT Time Calculation (min) 41 min    Activity Tolerance Patient tolerated treatment well    Behavior During Therapy WFL for tasks assessed/performed               Past Medical History:  Diagnosis Date   Arthritis    GERD (gastroesophageal reflux disease)    Heart murmur    Hyperlipidemia    Hypothyroidism    Internal hemorrhoid    Schatzki's ring    Thyroid disease    Past Surgical History:  Procedure Laterality Date   ABDOMINAL HYSTERECTOMY     BREAST SURGERY     for dilated duct   GLUTEUS MINIMUS REPAIR Left 02/12/2023   Procedure: LEFT GLUTEUS MEDIUS REPAIR WITH POSSIBLE COLLAGEN PATCH PLACEMENT;  Surgeon: Huel Cote, MD;  Location: [facility];  Service: Orthopedics;  Laterality: Left;   MYRINGOTOMY WITH TUBE PLACEMENT Left 12/19/2022   Procedure: MYRINGOTOMY WITH TUBE PLACEMENT;  Surgeon: Laren Boom, DO;  Location: [facility];  Service: ENT;  Laterality: Left;   Patient Active Problem List   Diagnosis Date Noted   Tear of left gluteus medius tendon 02/12/2023   Strain of gluteus medius 02/06/2023   Chronic serous otitis media 12/19/2022   Osteopenia 01/23/2016   Dyslipidemia 01/18/2016   History of esophageal stricture 01/18/2016   Postoperative hypothyroidism 01/18/2016   Esophageal reflux 01/18/2016   Snoring 01/18/2016     Days since surgery: 121  PCP: Adrian Prince, MD   REFERRING PROVIDER: Huel Cote, MD   REFERRING DIAG:  S76.012A (ICD-10-CM) - Tear of left gluteus medius tendon, initial encounter      THERAPY DIAG:  Pain in left hip  Muscle weakness  (generalized)  Difficulty walking  Rationale for Evaluation and Treatment: Rehabilitation  ONSET DATE: Days since surgery: Days since surgery: 121   PROCEDURE: 1. Left hip gluteus medius repair with collagen patch augmentation 2. Left hip trochanteric bursectomy  SUBJECTIVE:   SUBJECTIVE STATEMENT:  Pt is 17 weeks and 2 days post op.  Pt has been babysitting her grandson.  She was exhausted from babysitting though had no increased pain.  Pt has been walking more without her cane though has her cane with her just in case.  Pt denies any adverse effects after prior Rx.  She continues to have back pain.    PERTINENT HISTORY: LBP  PAIN: 0/10  pain in hip 0/10 pain in lumbar in sitting  Pain location: low back pain  Pain description: aching  Aggravating factors: moving WB Relieving factors: rest,   PRECAUTIONS: Other: Hip Glute Med Repair  WEIGHT BEARING RESTRICTIONS: Yes    FALLS:  Has patient fallen in last 6 months? No  LIVING ENVIRONMENT: Lives with: lives with their family and lives with their spouse Lives in: House/apartment Stairs: Yes, 2 story Has following equipment at home: Environmental consultant - 4 wheeled and Wheelchair (manual)  OCCUPATION: retired  PLOF: Independent  PATIENT GOALS: Return to exercise and walking w/o pain  NEXT MD VISIT: 2 wk f/u  OBJECTIVE:   DIAGNOSTIC FINDINGS:  IMPRESSION:  1. Moderate tendinosis and probable partial tearing of the left gluteus minimus tendon with associated muscular atrophy. Mild gluteus medius tendinosis, left greater than right. 2. Mild degenerative changes of both hips and sacroiliac joints. No acute osseous findings. 3. Chronic spondylosis in the visualized lower lumbar spine.    PATIENT SURVEYS:  FOTO 25 57 pts MCII 19 pts MCII   COGNITION: Overall cognitive status: Within functional limits for tasks assessed     SENSATION: WFL  POSTURE: rounded shoulders, increased thoracic kyphosis, and weight shift  right  PALPATION: No TTP noted around the incision sight; aquacell bandage in place without signs of infection  LOWER EXTREMITY ROM:  Active ROM Right eval Left eval Left   Hip flexion WFL 90   Hip extension WFL 0   Hip abduction WFL N/A   Hip adduction WFL N/A   Hip internal rotation WFL N/A   Hip external rotation [facility] N/A   Knee flexion WFL 120   Knee extension WFL -5    (Blank rows = not tested)  LOWER EXTREMITY MMT:    MMT Right eval Left eval  Hip flexion 15.5 13.5  Hip extension    Hip abduction 17.6 13.1  Hip adduction    Hip internal rotation    Hip external rotation    Knee flexion    Knee extension 23.3 28.2  Ankle dorsiflexion    Ankle plantarflexion    Ankle inversion    Ankle eversion     (Blank rows = not tested)   FUNCTIONAL TESTS:  FUNCTIONAL TESTS:  5 times sit to stand: single STS performed on single leg on L, UE support needed   GAIT: Distance walked: 65ft Assistive device utilized: able to walk RW Comments:  decreased L stance time, decreased R step length, limited hip extension bilat, step to pattern     TODAY'S TREATMENT:                                                                                                                              DATE:  8/15 Manual: Passive ROM within protocol limits in supine.  Trigger point release to lateral glutes and L sided lumbar paraspinals in R S/L'ing with pillow b/w knees.  Therapeutic Exercise:  SAQ 2x15 2lbs  LAQ 2x15 2lbs  Standing hip abduction left 2x10  Standing hip extension left 2x10   Step up 4 inch and 6 inch x10 each with rail  Sidestepping x 2 laps at rail Mini squats x 10 reps with UE support on rail   8/9 Manual: Passive ROM within protocol limits. Per protocol therapy began ER stretching today. Trigger point release to lateral glutes and IT band and lumbar spine, trigger point release to the lumbar spine    SAQ 2x15 2lbs   LAQ 2x15 2lbs   Standing hip abduction  left 2x10  Standing hip extension left 2x10    Step up 4 inch 2x10    7/26  Manual: Passive ROM within protocol limits. Per protocol therapy began ER stretching today. Trigger point release to lateral glutes and IT band and lumbar spine, trigger point release to the lumbar spine   SAQ 2x15   LAQ 2x15 yellow band  Hip abdcution 3x10 yellow  Heel raise x20   Slow march 3x10    7/18 Manual: Passive ROM within protocol limits. Per protocol therapy began ER stretching today. Trigger point release to lateral glutes and IT band, trigger point release to the lumbar spine   Low march 2x10  Hip abduction red 2x10   Standing slow march 2x10  Mini squat 2x10    7/11 Manual: Passive ROM within protocol limits. Per protocol therapy began ER stretching today. Trigger point release to lateral glutes and IT band, trigger point release to the lumbar spine   SAQ 2x15   LAQ 2x15 yellow band  Hip abdcution 3x10 yellow  Heel raise x20   Slow march     6/26 Manual: Passive ROM within protocol limits. Per protocol therapy began ER stretching today. Trigger point release to lateral glutes and IT band, trigger point release to the lumbar spine, reviewed self trigger point release to the lumbar spine.   PPT x20   SAQ 2x15   LAQ 2x10 yellow band  Hip abdcution 3x10 yellow  Step up 2 inch 2x10  Side step up 2 inch 2x10     6/20 Manual: Passive ROM within protocol limits. Per protocol therapy began ER stretching today. Trigger point release to lateral glutes and IT band, trigger point release to the lumbar spine, reviewed self trigger point release to the lumbar spine.   PPT x20  Ball stretch for lumbar fwd 5x 10 sec hold  Lateral 5x10sec hold bilateral   LAQ 2x10 yellow band  Hip abdcution 3x10 yellow  6/12 Manual: Passive ROM within protocol limits. Per protocol therapy began ER stretching today. Trigger point release to lateral glutes and IT band  PPT x20  Bridge 3x10    Step up 2 inch 2x10  Side step up 2 inch 2x10   LAQ 2x20 REP of 3 1.5 pounds will load more next visit       PATIENT EDUCATION:  Education details: precautions/protocol, AD usage, gait, cryotherapy, diagnosis, anatomy, exercise form, HEP, and POC.  Person educated: Patient Education method: Explanation, Demonstration, Tactile cues, Verbal cues, and Handouts Education comprehension: verbalized understanding, returned demonstration, verbal cues required, and tactile cues required   HOME EXERCISE PROGRAM:  Access Code: [email or web address] URL: [email or web address] Date: 02/14/2023 Prepared by: Zebedee Iba  ASSESSMENT:  ASSESSMENT:   CLINICAL IMPRESSION: Pt continues to have back pain.  PT performed STW including TPR to L sided lumbar paraspinals and lateral glutes.  Pt had trigger points in lumbar paraspinals and had tenderness at trigger points.  She tolerated manual therapy well.  Pt performed exercises well with cuing and instruction in correct form.  She had good tolerance with exercises.  Pt responded well to Rx having no increased pain and no c/o's after Rx.     OBJECTIVE IMPAIRMENTS Abnormal gait, decreased activity tolerance, decreased endurance, decreased mobility, difficulty walking, decreased ROM, decreased strength, hypomobility, increased muscle spasms, impaired flexibility, improper body mechanics, postural dysfunction, and pain.    ACTIVITY LIMITATIONS carrying, lifting, bending, sitting, standing, squatting, sleeping, stairs, transfers, and locomotion level   PARTICIPATION LIMITATIONS: cleaning, laundry, interpersonal relationship, driving, shopping, community activity, occupation, and exercise   PERSONAL FACTORS Age, Fitness, Time since onset of injury/illness/exacerbation, and 3+  comorbidities:  are also affecting patient's functional outcome.    REHAB POTENTIAL: Fair     CLINICAL DECISION MAKING: Stable/uncomplicated   EVALUATION COMPLEXITY: Low      GOALS:     SHORT TERM GOALS: Target date: 03/28/2023        Pt will become independent with HEP in order to demonstrate synthesis of PT education.     Goal status: Patient independent with basic HEP at this time achieved 7/26   2.  Pt will be able to demonstrate full PROM on L hip in order to demonstrate functional improvement in LE function for progression to next phase of rehab.     Goal status: Mild limitations in left hip external rotation progressing 7/26   3.  Pt will score at least 19 pt increase on FOTO to demonstrate functional improvement in MCII and pt perceived function.      Goal status: INITIAL     LONG TERM GOALS: Target date: Foto goal achieved 7/26       Pt  will become independent with final HEP in order to demonstrate synthesis of PT education.    Goal status: Continue to expand HEP for   2.  Pt will be able to demonstrate full AROM of the L hip in all planes in order to demonstrate functional improvement in LE function for self-care and house hold duties.   Goal status: Still limitations in external rotation 7/26    3  Pt will score >/= 57 on FOTO to demonstrate improvement in perceived L hip function.           Reach Foto goal achieved   4.  Pt will be able to demonstrate ability to squat to parallel without pain in order to demonstrate functional improvement in LE function for self-care and house hold duties.    Goal status: Squatting improving progressing 7/26   5.  Pt will be able to demonstrate/report ability to walk >30 mins without pain in order to demonstrate functional improvement and tolerance to exercise and community mobility.    Goal status: Community distance ambulation improving 7/26     PLAN: PT FREQUENCY: 1-2x/week   PT DURATION: 12 weeks   PLANNED INTERVENTIONS:  Continue to progress weight bearing and general strengthening per protocol    Audie Clear III PT, DPT 06/14/23 7:55 PM

## 2023-06-14 ENCOUNTER — Encounter (HOSPITAL_BASED_OUTPATIENT_CLINIC_OR_DEPARTMENT_OTHER): Payer: Self-pay | Admitting: Physical Therapy

## 2023-06-17 ENCOUNTER — Other Ambulatory Visit: Payer: Self-pay

## 2023-06-17 MED ORDER — FAMOTIDINE 40 MG PO TABS: 40.0000 mg | ORAL_TABLET | Freq: Every day | ORAL | 0 refills | Status: DC

## 2023-06-21 ENCOUNTER — Ambulatory Visit (HOSPITAL_BASED_OUTPATIENT_CLINIC_OR_DEPARTMENT_OTHER): Payer: Medicare Other | Admitting: Physical Therapy

## 2023-06-21 ENCOUNTER — Encounter (HOSPITAL_BASED_OUTPATIENT_CLINIC_OR_DEPARTMENT_OTHER): Payer: Self-pay | Admitting: Physical Therapy

## 2023-06-21 DIAGNOSIS — R262 Difficulty in walking, not elsewhere classified: Secondary | ICD-10-CM | POA: Diagnosis not present

## 2023-06-21 DIAGNOSIS — M6281 Muscle weakness (generalized): Secondary | ICD-10-CM

## 2023-06-21 DIAGNOSIS — M25552 Pain in left hip: Secondary | ICD-10-CM | POA: Diagnosis not present

## 2023-06-21 NOTE — Therapy (Signed)
OUTPATIENT PHYSICAL THERAPY LOWER EXTREMITY TREATMENT NOTE   Patient Name: Margaret Graham MRN: [id number] DOB:21-Aug-1946, 77 y.o., female Today's Date: 06/21/2023  END OF SESSION:  PT End of Session - 06/21/23 1353     Visit Number 17    Number of Visits 21    Date for PT Re-Evaluation 07/19/23    Authorization Type MCR progress note at 10    PT Start Time 1345    PT Stop Time 1428    PT Time Calculation (min) 43 min    Activity Tolerance Patient tolerated treatment well    Behavior During Therapy WFL for tasks assessed/performed               Past Medical History:  Diagnosis Date   Arthritis    GERD (gastroesophageal reflux disease)    Heart murmur    Hyperlipidemia    Hypothyroidism    Internal hemorrhoid    Schatzki's ring    Thyroid disease    Past Surgical History:  Procedure Laterality Date   ABDOMINAL HYSTERECTOMY     BREAST SURGERY     for dilated duct   GLUTEUS MINIMUS REPAIR Left 02/12/2023   Procedure: LEFT GLUTEUS MEDIUS REPAIR WITH POSSIBLE COLLAGEN PATCH PLACEMENT;  Surgeon: Huel Cote, MD;  Location: [facility];  Service: Orthopedics;  Laterality: Left;   MYRINGOTOMY WITH TUBE PLACEMENT Left 12/19/2022   Procedure: MYRINGOTOMY WITH TUBE PLACEMENT;  Surgeon: Laren Boom, DO;  Location: [facility];  Service: ENT;  Laterality: Left;   Patient Active Problem List   Diagnosis Date Noted   Tear of left gluteus medius tendon 02/12/2023   Strain of gluteus medius 02/06/2023   Chronic serous otitis media 12/19/2022   Osteopenia 01/23/2016   Dyslipidemia 01/18/2016   History of esophageal stricture 01/18/2016   Postoperative hypothyroidism 01/18/2016   Esophageal reflux 01/18/2016   Snoring 01/18/2016     Days since surgery: 129  PCP: Adrian Prince, MD   REFERRING PROVIDER: Huel Cote, MD   REFERRING DIAG:  S76.012A (ICD-10-CM) - Tear of left gluteus medius tendon, initial encounter      THERAPY DIAG:   Pain in left hip  Muscle weakness (generalized)  Difficulty walking  Rationale for Evaluation and Treatment: Rehabilitation  ONSET DATE: Days since surgery: Days since surgery: 129   PROCEDURE: 1. Left hip gluteus medius repair with collagen patch augmentation 2. Left hip trochanteric bursectomy  SUBJECTIVE:   SUBJECTIVE STATEMENT:  Pt is 17 weeks and 2 days post op.  Pt has been babysitting her grandson.  She was exhausted from babysitting though had no increased pain.  Pt has been walking more without her cane though has her cane with her just in case.  Pt denies any adverse effects after prior Rx.  She continues to have back pain.    PERTINENT HISTORY: LBP  PAIN: 0/10  pain in hip 0/10 pain in lumbar in sitting  Pain location: low back pain  Pain description: aching  Aggravating factors: moving WB Relieving factors: rest,   PRECAUTIONS: Other: Hip Glute Med Repair  WEIGHT BEARING RESTRICTIONS: Yes    FALLS:  Has patient fallen in last 6 months? No  LIVING ENVIRONMENT: Lives with: lives with their family and lives with their spouse Lives in: House/apartment Stairs: Yes, 2 story Has following equipment at home: Environmental consultant - 4 wheeled and Wheelchair (manual)  OCCUPATION: retired  PLOF: Independent  PATIENT GOALS: Return to exercise and walking w/o pain  NEXT MD VISIT: 2  wk f/u  OBJECTIVE:   DIAGNOSTIC FINDINGS:  IMPRESSION: 1. Moderate tendinosis and probable partial tearing of the left gluteus minimus tendon with associated muscular atrophy. Mild gluteus medius tendinosis, left greater than right. 2. Mild degenerative changes of both hips and sacroiliac joints. No acute osseous findings. 3. Chronic spondylosis in the visualized lower lumbar spine.    PATIENT SURVEYS:  FOTO 25 57 pts MCII 19 pts MCII   COGNITION: Overall cognitive status: Within functional limits for tasks assessed     SENSATION: WFL  POSTURE: rounded shoulders, increased  thoracic kyphosis, and weight shift right  PALPATION: No TTP noted around the incision sight; aquacell bandage in place without signs of infection  LOWER EXTREMITY ROM:  Active ROM Right eval Left eval Left   Hip flexion WFL 90   Hip extension WFL 0   Hip abduction WFL N/A   Hip adduction WFL N/A   Hip internal rotation WFL N/A   Hip external rotation [facility] N/A   Knee flexion WFL 120   Knee extension WFL -5    (Blank rows = not tested)  LOWER EXTREMITY MMT:    MMT Right eval Left eval  Hip flexion 15.5 13.5  Hip extension    Hip abduction 17.6 13.1  Hip adduction    Hip internal rotation    Hip external rotation    Knee flexion    Knee extension 23.3 28.2  Ankle dorsiflexion    Ankle plantarflexion    Ankle inversion    Ankle eversion     (Blank rows = not tested)   FUNCTIONAL TESTS:  FUNCTIONAL TESTS:  5 times sit to stand: single STS performed on single leg on L, UE support needed   GAIT: Distance walked: 60ft Assistive device utilized: able to walk RW Comments:  decreased L stance time, decreased R step length, limited hip extension bilat, step to pattern     TODAY'S TREATMENT:                                                                                                                              DATE:     8/23 Manual: Passive ROM within protocol limits in supine.  Trigger point release to lateral glutes and L sided lumbar paraspinals in R S/L'ing with pillow b/w knees.  LTRx20    Hip abduction 2x15 yellow 1s set red 2nd set  Supine March 2x10   Seated LAQ 2x15 red   Reviewed HEP and worked on grading exercises.    8/15 Manual: Passive ROM within protocol limits in supine.  Trigger point release to lateral glutes and L sided lumbar paraspinals in R S/L'ing with pillow b/w knees.  Therapeutic Exercise:  SAQ 2x15 2lbs  LAQ 2x15 2lbs  Standing hip abduction left 2x10  Standing hip extension left 2x10   Step up 4 inch and 6 inch x10  each with rail  Sidestepping x 2 laps at rail Mini squats x 10  reps with UE support on rail   8/9 Manual: Passive ROM within protocol limits. Per protocol therapy began ER stretching today. Trigger point release to lateral glutes and IT band and lumbar spine, trigger point release to the lumbar spine    SAQ 2x15 2lbs   LAQ 2x15 2lbs   Standing hip abduction left 2x10  Standing hip extension left 2x10    Step up 4 inch 2x10    7/26 Manual: Passive ROM within protocol limits. Per protocol therapy began ER stretching today. Trigger point release to lateral glutes and IT band and lumbar spine, trigger point release to the lumbar spine   SAQ 2x15   LAQ 2x15 yellow band  Hip abdcution 3x10 yellow  Heel raise x20   Slow march 3x10    7/18 Manual: Passive ROM within protocol limits. Per protocol therapy began ER stretching today. Trigger point release to lateral glutes and IT band, trigger point release to the lumbar spine   Low march 2x10  Hip abduction red 2x10   Standing slow march 2x10  Mini squat 2x10    7/11 Manual: Passive ROM within protocol limits. Per protocol therapy began ER stretching today. Trigger point release to lateral glutes and IT band, trigger point release to the lumbar spine   SAQ 2x15   LAQ 2x15 yellow band  Hip abdcution 3x10 yellow  Heel raise x20   Slow march         PATIENT EDUCATION:  Education details: precautions/protocol, AD usage, gait, cryotherapy, diagnosis, anatomy, exercise form, HEP, and POC.  Person educated: Patient Education method: Explanation, Demonstration, Tactile cues, Verbal cues, and Handouts Education comprehension: verbalized understanding, returned demonstration, verbal cues required, and tactile cues required   HOME EXERCISE PROGRAM:  Access Code: [email or web address] URL: [email or web address] Date: 02/14/2023 Prepared by: Zebedee Iba  ASSESSMENT:  ASSESSMENT:   CLINICAL IMPRESSION: The patient is  reported over the past few weeks she is noticing more bilateral trochanter pain.  Therapy looked at bilateral IT bands and found several trigger points.  She reports more pain when she rides for longer period of time.  She is advised to use a roller on these areas.  We reviewed her exercises.  She reports she needs to be doing them more consistently.  We talked a lot today about grading her exercises properly using RPE.  We advance her band with clamshells to red.  She was advised to work diligently on her soft tissue mobilization and her exercises over the next few weeks.  OBJECTIVE IMPAIRMENTS Abnormal gait, decreased activity tolerance, decreased endurance, decreased mobility, difficulty walking, decreased ROM, decreased strength, hypomobility, increased muscle spasms, impaired flexibility, improper body mechanics, postural dysfunction, and pain.    ACTIVITY LIMITATIONS carrying, lifting, bending, sitting, standing, squatting, sleeping, stairs, transfers, and locomotion level   PARTICIPATION LIMITATIONS: cleaning, laundry, interpersonal relationship, driving, shopping, community activity, occupation, and exercise   PERSONAL FACTORS Age, Fitness, Time since onset of injury/illness/exacerbation, and 3+ comorbidities:  are also affecting patient's functional outcome.    REHAB POTENTIAL: Fair     CLINICAL DECISION MAKING: Stable/uncomplicated   EVALUATION COMPLEXITY: Low     GOALS:     SHORT TERM GOALS: Target date: 03/28/2023        Pt will become independent with HEP in order to demonstrate synthesis of PT education.     Goal status: Patient independent with basic HEP at this time achieved 7/26   2.  Pt will be able to demonstrate full PROM on L hip  in order to demonstrate functional improvement in LE function for progression to next phase of rehab.     Goal status: Mild limitations in left hip external rotation progressing 7/26   3.  Pt will score at least 19 pt increase on FOTO  to demonstrate functional improvement in MCII and pt perceived function.      Goal status: INITIAL     LONG TERM GOALS: Target date: Foto goal achieved 7/26       Pt  will become independent with final HEP in order to demonstrate synthesis of PT education.    Goal status: Continue to expand HEP for   2.  Pt will be able to demonstrate full AROM of the L hip in all planes in order to demonstrate functional improvement in LE function for self-care and house hold duties.   Goal status: Still limitations in external rotation 7/26    3  Pt will score >/= 57 on FOTO to demonstrate improvement in perceived L hip function.           Reach Foto goal achieved   4.  Pt will be able to demonstrate ability to squat to parallel without pain in order to demonstrate functional improvement in LE function for self-care and house hold duties.    Goal status: Squatting improving progressing 7/26   5.  Pt will be able to demonstrate/report ability to walk >30 mins without pain in order to demonstrate functional improvement and tolerance to exercise and community mobility.    Goal status: Community distance ambulation improving 7/26     PLAN: PT FREQUENCY: 1-2x/week   PT DURATION: 12 weeks   PLANNED INTERVENTIONS:  Continue to progress weight bearing and general strengthening per protocol    Lorayne Bender PT DPT 06/21/23 1:58 PM

## 2023-06-26 DIAGNOSIS — H2512 Age-related nuclear cataract, left eye: Secondary | ICD-10-CM | POA: Diagnosis not present

## 2023-06-26 DIAGNOSIS — H25812 Combined forms of age-related cataract, left eye: Secondary | ICD-10-CM | POA: Diagnosis not present

## 2023-06-28 ENCOUNTER — Ambulatory Visit (HOSPITAL_BASED_OUTPATIENT_CLINIC_OR_DEPARTMENT_OTHER): Payer: Medicare Other | Admitting: Physical Therapy

## 2023-07-05 ENCOUNTER — Ambulatory Visit (HOSPITAL_BASED_OUTPATIENT_CLINIC_OR_DEPARTMENT_OTHER): Payer: Medicare Other | Attending: Orthopaedic Surgery | Admitting: Physical Therapy

## 2023-07-05 ENCOUNTER — Encounter (HOSPITAL_BASED_OUTPATIENT_CLINIC_OR_DEPARTMENT_OTHER): Payer: Self-pay | Admitting: Physical Therapy

## 2023-07-05 DIAGNOSIS — M6281 Muscle weakness (generalized): Secondary | ICD-10-CM | POA: Diagnosis not present

## 2023-07-05 DIAGNOSIS — R262 Difficulty in walking, not elsewhere classified: Secondary | ICD-10-CM | POA: Insufficient documentation

## 2023-07-05 DIAGNOSIS — M25552 Pain in left hip: Secondary | ICD-10-CM | POA: Diagnosis not present

## 2023-07-05 NOTE — Therapy (Signed)
OUTPATIENT PHYSICAL THERAPY LOWER EXTREMITY TREATMENT NOTE   Patient Name: Margaret Graham MRN: [id number] DOB:21-Aug-1946, 77 y.o., female Today's Date: 07/05/2023  END OF SESSION:  PT End of Session - 07/05/23 0849     Visit Number 18    Number of Visits 21    Date for PT Re-Evaluation 07/19/23    Authorization Type MCR progress note at 10    PT Start Time 0845    PT Stop Time 0927    PT Time Calculation (min) 42 min    Activity Tolerance Patient tolerated treatment well    Behavior During Therapy [facility] for tasks assessed/performed               Past Medical History:  Diagnosis Date   Arthritis    GERD (gastroesophageal reflux disease)    Heart murmur    Hyperlipidemia    Hypothyroidism    Internal hemorrhoid    Schatzki's ring    Thyroid disease    Past Surgical History:  Procedure Laterality Date   ABDOMINAL HYSTERECTOMY     BREAST SURGERY     for dilated duct   GLUTEUS MINIMUS REPAIR Left 02/12/2023   Procedure: LEFT GLUTEUS MEDIUS REPAIR WITH POSSIBLE COLLAGEN PATCH PLACEMENT;  Surgeon: Huel Cote, MD;  Location: [facility];  Service: Orthopedics;  Laterality: Left;   MYRINGOTOMY WITH TUBE PLACEMENT Left 12/19/2022   Procedure: MYRINGOTOMY WITH TUBE PLACEMENT;  Surgeon: Laren Boom, DO;  Location: [facility];  Service: ENT;  Laterality: Left;   Patient Active Problem List   Diagnosis Date Noted   Tear of left gluteus medius tendon 02/12/2023   Strain of gluteus medius 02/06/2023   Chronic serous otitis media 12/19/2022   Osteopenia 01/23/2016   Dyslipidemia 01/18/2016   History of esophageal stricture 01/18/2016   Postoperative hypothyroidism 01/18/2016   Esophageal reflux 01/18/2016   Snoring 01/18/2016     Days since surgery: 143  PCP: Adrian Prince, MD   REFERRING PROVIDER: Huel Cote, MD   REFERRING DIAG:  S76.012A (ICD-10-CM) - Tear of left gluteus medius tendon, initial encounter      THERAPY DIAG:   Pain in left hip  Muscle weakness (generalized)  Difficulty walking  Rationale for Evaluation and Treatment: Rehabilitation  ONSET DATE: Days since surgery: Days since surgery: 143   PROCEDURE: 1. Left hip gluteus medius repair with collagen patch augmentation 2. Left hip trochanteric bursectomy  SUBJECTIVE:   SUBJECTIVE STATEMENT:  The patient reports he has been doing pretty well.    PERTINENT HISTORY: LBP  PAIN: 0/10  pain in hip 0/10 pain in lumbar in sitting  Pain location: low back pain  Pain description: aching  Aggravating factors: moving WB Relieving factors: rest,   PRECAUTIONS: Other: Hip Glute Med Repair  WEIGHT BEARING RESTRICTIONS: Yes    FALLS:  Has patient fallen in last 6 months? No  LIVING ENVIRONMENT: Lives with: lives with their family and lives with their spouse Lives in: House/apartment Stairs: Yes, 2 story Has following equipment at home: Environmental consultant - 4 wheeled and Wheelchair (manual)  OCCUPATION: retired  PLOF: Independent  PATIENT GOALS: Return to exercise and walking w/o pain  NEXT MD VISIT: 2 wk f/u  OBJECTIVE:   DIAGNOSTIC FINDINGS:  IMPRESSION: 1. Moderate tendinosis and probable partial tearing of the left gluteus minimus tendon with associated muscular atrophy. Mild gluteus medius tendinosis, left greater than right. 2. Mild degenerative changes of both hips and sacroiliac joints. No acute osseous findings. 3. Chronic spondylosis  in the visualized lower lumbar spine.    PATIENT SURVEYS:  FOTO 25 57 pts MCII 19 pts MCII   COGNITION: Overall cognitive status: Within functional limits for tasks assessed     SENSATION: WFL  POSTURE: rounded shoulders, increased thoracic kyphosis, and weight shift right  PALPATION: No TTP noted around the incision sight; aquacell bandage in place without signs of infection  LOWER EXTREMITY ROM:  Active ROM Right eval Left eval Left   Hip flexion WFL 90   Hip extension WFL 0    Hip abduction WFL N/A   Hip adduction WFL N/A   Hip internal rotation WFL N/A   Hip external rotation [facility] N/A   Knee flexion WFL 120   Knee extension WFL -5    (Blank rows = not tested)  LOWER EXTREMITY MMT:    MMT Right eval Left eval  Hip flexion 15.5 13.5  Hip extension    Hip abduction 17.6 13.1  Hip adduction    Hip internal rotation    Hip external rotation    Knee flexion    Knee extension 23.3 28.2  Ankle dorsiflexion    Ankle plantarflexion    Ankle inversion    Ankle eversion     (Blank rows = not tested)   FUNCTIONAL TESTS:  FUNCTIONAL TESTS:  5 times sit to stand: single STS performed on single leg on L, UE support needed   GAIT: Distance walked: 3ft Assistive device utilized: able to walk RW Comments:  decreased L stance time, decreased R step length, limited hip extension bilat, step to pattern     TODAY'S TREATMENT:                                                                                                                              DATE:  9/6 LTRx20    Hip abduction 3x15 red Supine March 2x10     Seated LAQ 3x15 red   Standing heel/toe x20  Standing slow march 2x10   Reviewed HEP and worked on grading exercises.    8/23 Manual: Passive ROM within protocol limits in supine.  Trigger point release to lateral glutes and L sided lumbar paraspinals in R S/L'ing with pillow b/w knees.    8/15 Manual: Passive ROM within protocol limits in supine.  Trigger point release to lateral glutes and L sided lumbar paraspinals in R S/L'ing with pillow b/w knees.  Therapeutic Exercise:  SAQ 2x15 2lbs  LAQ 2x15 2lbs  Standing hip abduction left 2x10  Standing hip extension left 2x10   Step up 4 inch and 6 inch x10 each with rail  Sidestepping x 2 laps at rail Mini squats x 10 reps with UE support on rail   8/9 Manual: Passive ROM within protocol limits. Per protocol therapy began ER stretching today. Trigger point release to lateral  glutes and IT band and lumbar spine, trigger point release to the lumbar spine    SAQ 2x15  2lbs   LAQ 2x15 2lbs   Standing hip abduction left 2x10  Standing hip extension left 2x10    Step up 4 inch 2x10    7/26 Manual: Passive ROM within protocol limits. Per protocol therapy began ER stretching today. Trigger point release to lateral glutes and IT band and lumbar spine, trigger point release to the lumbar spine   SAQ 2x15   LAQ 2x15 yellow band  Hip abdcution 3x10 yellow  Heel raise x20   Slow march 3x10    7/18 Manual: Passive ROM within protocol limits. Per protocol therapy began ER stretching today. Trigger point release to lateral glutes and IT band, trigger point release to the lumbar spine   Low march 2x10  Hip abduction red 2x10   Standing slow march 2x10  Mini squat 2x10    7/11 Manual: Passive ROM within protocol limits. Per protocol therapy began ER stretching today. Trigger point release to lateral glutes and IT band, trigger point release to the lumbar spine   SAQ 2x15   LAQ 2x15 yellow band  Hip abdcution 3x10 yellow  Heel raise x20   Slow march         PATIENT EDUCATION:  Education details: precautions/protocol, AD usage, gait, cryotherapy, diagnosis, anatomy, exercise form, HEP, and POC.  Person educated: Patient Education method: Explanation, Demonstration, Tactile cues, Verbal cues, and Handouts Education comprehension: verbalized understanding, returned demonstration, verbal cues required, and tactile cues required   HOME EXERCISE PROGRAM:  Access Code: [email or web address] URL: [email or web address] Date: 02/14/2023 Prepared by: Zebedee Iba  ASSESSMENT:  ASSESSMENT:   CLINICAL IMPRESSION: The patient has been doing well. We reviewed her base exercise program today. She had minimal pain. She had the most difficulty with standing march. She was advised that is the most important one that she can do. We will work over the next 2  visits at finalizing her HEP.    OBJECTIVE IMPAIRMENTS Abnormal gait, decreased activity tolerance, decreased endurance, decreased mobility, difficulty walking, decreased ROM, decreased strength, hypomobility, increased muscle spasms, impaired flexibility, improper body mechanics, postural dysfunction, and pain.    ACTIVITY LIMITATIONS carrying, lifting, bending, sitting, standing, squatting, sleeping, stairs, transfers, and locomotion level   PARTICIPATION LIMITATIONS: cleaning, laundry, interpersonal relationship, driving, shopping, community activity, occupation, and exercise   PERSONAL FACTORS Age, Fitness, Time since onset of injury/illness/exacerbation, and 3+ comorbidities:  are also affecting patient's functional outcome.    REHAB POTENTIAL: Fair     CLINICAL DECISION MAKING: Stable/uncomplicated   EVALUATION COMPLEXITY: Low     GOALS:     SHORT TERM GOALS: Target date: 03/28/2023        Pt will become independent with HEP in order to demonstrate synthesis of PT education.     Goal status: Patient independent with basic HEP at this time achieved 7/26   2.  Pt will be able to demonstrate full PROM on L hip in order to demonstrate functional improvement in LE function for progression to next phase of rehab.     Goal status: Mild limitations in left hip external rotation progressing 7/26   3.  Pt will score at least 19 pt increase on FOTO to demonstrate functional improvement in MCII and pt perceived function.      Goal status: INITIAL     LONG TERM GOALS: Target date: Foto goal achieved 7/26       Pt  will become independent with final HEP in order to demonstrate synthesis of PT education.    Goal status: Continue  to expand HEP for   2.  Pt will be able to demonstrate full AROM of the L hip in all planes in order to demonstrate functional improvement in LE function for self-care and house hold duties.   Goal status: Still limitations in external rotation  7/26    3  Pt will score >/= 57 on FOTO to demonstrate improvement in perceived L hip function.           Reach Foto goal achieved   4.  Pt will be able to demonstrate ability to squat to parallel without pain in order to demonstrate functional improvement in LE function for self-care and house hold duties.    Goal status: Squatting improving progressing 7/26   5.  Pt will be able to demonstrate/report ability to walk >30 mins without pain in order to demonstrate functional improvement and tolerance to exercise and community mobility.    Goal status: Community distance ambulation improving 7/26     PLAN: PT FREQUENCY: 1-2x/week   PT DURATION: 12 weeks   PLANNED INTERVENTIONS:  Continue to progress weight bearing and general strengthening per protocol    Lorayne Bender PT DPT 07/05/23 9:28 AM

## 2023-07-10 ENCOUNTER — Ambulatory Visit (HOSPITAL_BASED_OUTPATIENT_CLINIC_OR_DEPARTMENT_OTHER): Payer: Medicare Other | Admitting: Physical Therapy

## 2023-07-10 DIAGNOSIS — H25811 Combined forms of age-related cataract, right eye: Secondary | ICD-10-CM | POA: Diagnosis not present

## 2023-07-10 DIAGNOSIS — H2511 Age-related nuclear cataract, right eye: Secondary | ICD-10-CM | POA: Diagnosis not present

## 2023-07-17 ENCOUNTER — Ambulatory Visit (HOSPITAL_BASED_OUTPATIENT_CLINIC_OR_DEPARTMENT_OTHER): Payer: Medicare Other | Admitting: Physical Therapy

## 2023-07-23 ENCOUNTER — Encounter: Payer: Self-pay | Admitting: *Deleted

## 2023-07-23 ENCOUNTER — Encounter: Payer: Self-pay | Admitting: Neurology

## 2023-07-23 ENCOUNTER — Ambulatory Visit (INDEPENDENT_AMBULATORY_CARE_PROVIDER_SITE_OTHER): Payer: Medicare Other | Admitting: Neurology

## 2023-07-23 VITALS — BP 135/73 | HR 83 | Ht 65.0 in | Wt 154.0 lb

## 2023-07-23 DIAGNOSIS — Z9189 Other specified personal risk factors, not elsewhere classified: Secondary | ICD-10-CM

## 2023-07-23 DIAGNOSIS — E663 Overweight: Secondary | ICD-10-CM | POA: Diagnosis not present

## 2023-07-23 DIAGNOSIS — Z820 Family history of epilepsy and other diseases of the nervous system: Secondary | ICD-10-CM | POA: Diagnosis not present

## 2023-07-23 DIAGNOSIS — R0683 Snoring: Secondary | ICD-10-CM

## 2023-07-23 DIAGNOSIS — R351 Nocturia: Secondary | ICD-10-CM

## 2023-07-23 NOTE — Progress Notes (Signed)
Subjective:    Patient ID: Margaret Graham is a 77 y.o. female.  HPI    Huston Foley, MD, PhD [facility] [address], [address] [address] [place], [place] [address]  Dear Dr. Evlyn Kanner,  I saw your patient, Margaret Graham in [place], upon your kind request in my sleep clinic today for initial consultation of her sleep disorder, in particular, concern for underlying obstructive sleep apnea.  The patient is accompanied by her husband today.  As you know, Margaret Graham is a 77 year old female with an underlying medical history of arthritis, reflux disease, hyperlipidemia, hypothyroidism, and heart murmur, who reports loud snoring for the past several years.  Her snoring disturbs her husband and they typically do not sleep in the same bedroom any longer.  Her Epworth sleepiness score is 2 out of 24, fatigue severity score is 8 out of 63.  She reports that her son had sleep apnea, he lived to be 61 years old.  She goes to bed around 8 PM and likes to read in bed on her phone.  She does not watch TV in her bedroom.  Rise time is around 8 AM.  She has no pets in the household.  She lives with her husband, they have 1 daughter in [place] and in fact, they are planning to move to [place], [place] area to be closer to their daughter, they are hoping to move before Christmas.  They have 2 grandchildren here in this area from her son, ages 37 and 25 and they have 2 grown daughters in [place].  Patient is retired, she is a non-smoker, she drinks alcohol daily or nearly daily, usually 1 glass of wine with dinner.  She drinks caffeine in the form of coffee, usually 1 mug per day.  She has nocturia about once per average night, denies recurrent nocturnal or morning headaches. I reviewed your office note from 05/15/2023.  Her Past Medical History Is Significant For: Past Medical History:  Diagnosis Date   Arthritis    GERD (gastroesophageal reflux disease)    Heart murmur    Hyperlipidemia     Hypothyroidism    Internal hemorrhoid    Osteopenia    Schatzki's ring    Thyroid disease    Thyroid nodule    right 1.7 cm   Tubular adenoma     Her Past Surgical History Is Significant For: Past Surgical History:  Procedure Laterality Date   ABDOMINAL HYSTERECTOMY     BREAST SURGERY     for dilated duct   CATARACT EXTRACTION Bilateral    GLUTEUS MINIMUS REPAIR Left 02/12/2023   Procedure: LEFT GLUTEUS MEDIUS REPAIR WITH POSSIBLE COLLAGEN PATCH PLACEMENT;  Surgeon: Huel Cote, MD;  Location: [facility];  Service: Orthopedics;  Laterality: Left;   left thyroid lobectomy  2004   MYRINGOTOMY WITH TUBE PLACEMENT Left 12/19/2022   Procedure: MYRINGOTOMY WITH TUBE PLACEMENT;  Surgeon: Laren Boom, DO;  Location: [facility];  Service: ENT;  Laterality: Left;    Her Family History Is Significant For: Family History  Problem Relation Age of Onset   Transient ischemic attack Mother    Arthritis Mother    Transient ischemic attack Father    Arthritis Father    Hyperlipidemia Father    Thyroid disease Sister    Diabetes Son    Asthma Son    Heart disease Paternal Uncle    Colon cancer Neg Hx    Esophageal cancer Neg Hx    Rectal  cancer Neg Hx    Sleep apnea Neg Hx     Her Social History Is Significant For: Social History   Socioeconomic History   Marital status: Married    Spouse name: Not on file   Number of children: 2   Years of education: Not on file   Highest education level: Not on file  Occupational History   Not on file  Tobacco Use   Smoking status: Never   Smokeless tobacco: Never  Vaping Use   Vaping status: Never Used  Substance and Sexual Activity   Alcohol use: Yes    Alcohol/week: 7.0 standard drinks of alcohol    Types: 7 Glasses of wine per week    Comment: 1 glass a day typically   Drug use: No   Sexual activity: Not Currently    Partners: Male  Other Topics Concern   Not on file  Social History Narrative   Retired  from [employer].   Moved to [facility] 07/2015.   From [place].   Social Determinants of Health   Financial Resource Strain: Not on file  Food Insecurity: Not on file  Transportation Needs: Not on file  Physical Activity: Not on file  Stress: Not on file  Social Connections: Not on file    Her Allergies Are:  Allergies  Allergen Reactions   Declomycin [Demeclocycline]   :   Her Current Medications Are:  Outpatient Encounter Medications as of 07/23/2023  Medication Sig   alendronate (FOSAMAX) 70 MG tablet Take 70 mg by mouth once a week. Take with a full glass of water on an empty stomach.   Ascorbic Acid (VITAMIN C) 1000 MG tablet Take 1,000 mg by mouth daily.   ASPIRIN 81 PO Take by mouth daily.   Calcium Carb-Cholecalciferol (CALCIUM 600 + D PO) Take by mouth.   Carboxymethylcellulose Sodium (REFRESH PLUS OP) Place 1 drop into both eyes daily. PF   ezetimibe (ZETIA) 10 MG tablet Take 10 mg by mouth daily.   famotidine (PEPCID) 40 MG tablet Take 1 tablet (40 mg total) by mouth at bedtime.   levothyroxine (SYNTHROID, LEVOTHROID) 100 MCG tablet Take 1 tablet (100 mcg total) by mouth every morning.   Multiple Vitamin (MULTIVITAMIN) capsule Take 1 capsule by mouth daily.   Multiple Vitamins-Minerals (PRESERVISION AREDS 2) CAPS Take 1 tablet by mouth 2 (two) times daily.   simvastatin (ZOCOR) 20 MG tablet Take 1 tablet (20 mg total) by mouth daily.   [DISCONTINUED] docusate sodium (COLACE) 100 MG capsule Take 200 mg by mouth at bedtime.   [DISCONTINUED] aspirin EC 325 MG tablet Take 1 tablet (325 mg total) by mouth daily.   [DISCONTINUED] Cholecalciferol (VITAMIN D3) 25 MCG (1000 UT) capsule Take 1,000 Units by mouth daily.   [DISCONTINUED] fluticasone (FLONASE) 50 MCG/ACT nasal spray Place 2 sprays into both nostrils in the morning.   [DISCONTINUED] MAGNESIUM PO Take by mouth at bedtime.   [DISCONTINUED] ondansetron (ZOFRAN-ODT) 8 MG disintegrating tablet Take 1 tablet (8 mg total) by mouth  every 8 (eight) hours as needed for nausea or vomiting.   [DISCONTINUED] oxyCODONE (OXY IR/ROXICODONE) 5 MG immediate release tablet Take 1 tablet (5 mg total) by mouth every 4 (four) hours as needed (severe pain).   [DISCONTINUED] zinc gluconate 50 MG tablet Take 50 mg by mouth daily.   Facility-Administered Encounter Medications as of 07/23/2023  Medication   0.9 %  sodium chloride infusion  :   Review of Systems:  Out of a complete 14 point  review of systems, all are reviewed and negative with the exception of these symptoms as listed below:  Review of Systems  Neurological:        Patient is here with her husband for sleep evaluation. Patient reports she snores. She has woken herself up snoring before. Her husband cannot sleep in the same room as her anymore due to the snoring. She states she sleeps on her sides or prone but never on her back. She has never had a sleep study. Husband denies any witnessed apnea episodes. ESS 2 FSS 14    Objective:  Neurological Exam  Physical Exam Physical Examination:   Vitals:   07/23/23 1015  BP: 135/73  Pulse: 83    General Examination: The patient is a very pleasant 77 y.o. female in no acute distress. She appears well-developed and well-nourished and well groomed.   HEENT: Normocephalic, atraumatic, pupils are equal, round and reactive to light, extraocular tracking is good without limitation to gaze excursion or nystagmus noted. Hearing is grossly intact. Face is symmetric with normal facial animation. Speech is clear with no dysarthria noted. There is no hypophonia. There is no lip, neck/head, jaw or voice tremor. Neck is supple with full range of passive and active motion. There are no carotid bruits on auscultation. Oropharynx exam reveals: mild mouth dryness, good dental hygiene and mild airway crowding, due to small airway entry.  Mallampati class II, tonsils on the smaller side, slightly wider uvula.  Neck circumference 15-1/2 inches,  mild to moderate overbite noted.  Tongue protrudes centrally and palate elevates symmetrically.  Chest: Clear to auscultation without wheezing, rhonchi or crackles noted.  Heart: S1+S2+0, regular and normal without murmurs, rubs or gallops noted.   Abdomen: Soft, non-tender and non-distended.  Extremities: There is no pitting edema in the distal lower extremities bilaterally.   Skin: Warm and dry without trophic changes noted.   Musculoskeletal: exam reveals no obvious joint deformities.   Neurologically:  Mental status: The patient is awake, alert and oriented in all 4 spheres. Her immediate and remote memory, attention, language skills and fund of knowledge are appropriate. There is no evidence of aphasia, agnosia, apraxia or anomia. Speech is clear with normal prosody and enunciation. Thought process is linear. Mood is normal and affect is normal.  Cranial nerves II - XII are as described above under HEENT exam.  Motor exam: Normal bulk, strength and tone is noted. There is no obvious action or resting tremor.  Fine motor skills and coordination: grossly intact.  Cerebellar testing: No dysmetria or intention tremor. There is no truncal or gait ataxia.  Sensory exam: intact to light touch in the upper and lower extremities.  Gait, station and balance: She stands easily. No veering to one side is noted. No leaning to one side is noted. Posture is age-appropriate and stance is narrow based. Gait shows normal stride length and normal pace. No problems turning are noted.   Assessment and Plan:  In summary, Margaret Graham is a very pleasant 77 y.o.-year old female with an underlying medical history of arthritis, reflux disease, hyperlipidemia, hypothyroidism, and heart murmur, whose history and physical exam are concerning for sleep disordered breathing, particularly obstructive sleep apnea (OSA). A laboratory attended sleep study is typically considered "gold standard" for evaluation of  sleep disordered breathing.   I had a long chat with the patient and her husband about my findings and the diagnosis of sleep apnea, particularly OSA, its prognosis and treatment options. We talked about  medical/conservative treatments, surgical interventions and non-pharmacological approaches for symptom control. I explained, in particular, the risks and ramifications of untreated moderate to severe OSA, especially with respect to developing cardiovascular disease down the road, including congestive heart failure (CHF), difficult to treat hypertension, cardiac arrhythmias (particularly A-fib), neurovascular complications including TIA, stroke and dementia. Even type 2 diabetes has, in part, been linked to untreated OSA. Symptoms of untreated OSA may include (but may not be limited to) daytime sleepiness, nocturia (i.e. frequent nighttime urination), memory problems, mood irritability and suboptimally controlled or worsening mood disorder such as depression and/or anxiety, lack of energy, lack of motivation, physical discomfort, as well as recurrent headaches, especially morning or nocturnal headaches. We talked about the importance of maintaining a healthy lifestyle and striving for healthy weight. In addition, we talked about the importance of striving for and maintaining good sleep hygiene. I recommended a sleep study at this time. I outlined the differences between a laboratory attended sleep study which is considered more comprehensive and accurate over the option of a home sleep test (HST); the latter may lead to underestimation of sleep disordered breathing in some instances and does not help with diagnosing upper airway resistance syndrome and is not accurate enough to diagnose primary central sleep apnea typically. I outlined possible surgical and non-surgical treatment options of OSA, including the use of a positive airway pressure (PAP) device (i.e. CPAP, AutoPAP/APAP or BiPAP in certain  circumstances), a custom-made dental device (aka oral appliance, which would require a referral to a specialist dentist or orthodontist typically, and is generally speaking not considered for patients with full dentures or edentulous state), upper airway surgical options, such as traditional UPPP (which is not considered a first-line treatment) or the Inspire device (hypoglossal nerve stimulator, which would involve a referral for consultation with an ENT surgeon, after careful selection, following inclusion criteria - also not first-line treatment). I explained the PAP treatment option to the patient in detail, as this is generally considered first-line treatment.  The patient indicated that she would be willing to try PAP therapy, if the need arises. I explained the importance of being compliant with PAP treatment, not only for insurance purposes but primarily to improve patient's symptoms symptoms, and for the patient's long term health benefit, including to reduce Her cardiovascular risks longer-term.    We will pick up our discussion about the next steps and treatment options after testing.  We will keep them posted as to the test results by phone call and/or MyChart messaging where possible.  We will plan to follow-up in sleep clinic accordingly as well.  I answered all their questions today and the patient and her husband were in agreement.   I encouraged her to call with any interim questions, concerns, problems or updates or email [place] through MyChart.  Generally speaking, sleep test authorizations may take up to 2 weeks, sometimes less, sometimes longer, the patient is encouraged to get in touch with [place] if they do not hear back from the sleep lab staff directly within the next 2 weeks.  Thank you very much for allowing me to participate in the care of this nice patient. If I can be of any further assistance to you please do not hesitate to call me at [phone].  Sincerely,   Huston Foley, MD,  PhD

## 2023-07-23 NOTE — Patient Instructions (Signed)

## 2023-07-25 DIAGNOSIS — Z23 Encounter for immunization: Secondary | ICD-10-CM | POA: Diagnosis not present

## 2023-07-29 ENCOUNTER — Ambulatory Visit (HOSPITAL_BASED_OUTPATIENT_CLINIC_OR_DEPARTMENT_OTHER): Payer: Medicare Other | Admitting: Physical Therapy

## 2023-07-29 ENCOUNTER — Encounter (HOSPITAL_BASED_OUTPATIENT_CLINIC_OR_DEPARTMENT_OTHER): Payer: Self-pay

## 2023-08-01 DIAGNOSIS — M810 Age-related osteoporosis without current pathological fracture: Secondary | ICD-10-CM | POA: Diagnosis not present

## 2023-08-01 DIAGNOSIS — E039 Hypothyroidism, unspecified: Secondary | ICD-10-CM | POA: Diagnosis not present

## 2023-08-01 DIAGNOSIS — M899 Disorder of bone, unspecified: Secondary | ICD-10-CM | POA: Diagnosis not present

## 2023-08-02 ENCOUNTER — Encounter (HOSPITAL_BASED_OUTPATIENT_CLINIC_OR_DEPARTMENT_OTHER): Payer: Self-pay | Admitting: Physical Therapy

## 2023-08-09 ENCOUNTER — Ambulatory Visit (HOSPITAL_BASED_OUTPATIENT_CLINIC_OR_DEPARTMENT_OTHER): Payer: Medicare Other | Attending: Orthopaedic Surgery | Admitting: Physical Therapy

## 2023-08-09 DIAGNOSIS — R262 Difficulty in walking, not elsewhere classified: Secondary | ICD-10-CM

## 2023-08-09 DIAGNOSIS — M6281 Muscle weakness (generalized): Secondary | ICD-10-CM | POA: Diagnosis not present

## 2023-08-09 DIAGNOSIS — M25552 Pain in left hip: Secondary | ICD-10-CM | POA: Diagnosis not present

## 2023-08-09 NOTE — Therapy (Signed)
OUTPATIENT PHYSICAL THERAPY LOWER EXTREMITY TREATMENT NOTE   Patient Name: Margaret Graham MRN: [id number] DOB:11/17/1945, 77 y.o., female Today's Date: 08/09/2023  END OF SESSION:  PT End of Session - 08/09/23 1318     Visit Number 19    Number of Visits 21    Date for PT Re-Evaluation 09/06/23    PT Start Time 1315    PT Stop Time 1358    PT Time Calculation (min) 43 min    Activity Tolerance Patient tolerated treatment well    Behavior During Therapy WFL for tasks assessed/performed                Past Medical History:  Diagnosis Date   Arthritis    GERD (gastroesophageal reflux disease)    Heart murmur    Hyperlipidemia    Hypothyroidism    Internal hemorrhoid    Osteopenia    Schatzki's ring    Thyroid disease    Thyroid nodule    right 1.7 cm   Tubular adenoma    Past Surgical History:  Procedure Laterality Date   ABDOMINAL HYSTERECTOMY     BREAST SURGERY     for dilated duct   CATARACT EXTRACTION Bilateral    GLUTEUS MINIMUS REPAIR Left 02/12/2023   Procedure: LEFT GLUTEUS MEDIUS REPAIR WITH POSSIBLE COLLAGEN PATCH PLACEMENT;  Surgeon: Huel Cote, MD;  Location: [facility];  Service: Orthopedics;  Laterality: Left;   left thyroid lobectomy  2004   MYRINGOTOMY WITH TUBE PLACEMENT Left 12/19/2022   Procedure: MYRINGOTOMY WITH TUBE PLACEMENT;  Surgeon: Laren Boom, DO;  Location: [facility];  Service: ENT;  Laterality: Left;   Patient Active Problem List   Diagnosis Date Noted   Tear of left gluteus medius tendon 02/12/2023   Strain of gluteus medius 02/06/2023   Chronic serous otitis media 12/19/2022   Osteopenia 01/23/2016   Dyslipidemia 01/18/2016   History of esophageal stricture 01/18/2016   Postoperative hypothyroidism 01/18/2016   Esophageal reflux 01/18/2016   Snoring 01/18/2016     Days since surgery: 178  PCP: Adrian Prince, MD   REFERRING PROVIDER: Huel Cote, MD   REFERRING DIAG:  S76.012A  (ICD-10-CM) - Tear of left gluteus medius tendon, initial encounter      THERAPY DIAG:  Pain in left hip  Muscle weakness (generalized)  Difficulty walking  Rationale for Evaluation and Treatment: Rehabilitation  ONSET DATE: Days since surgery: Days since surgery: 178   PROCEDURE: 1. Left hip gluteus medius repair with collagen patch augmentation 2. Left hip trochanteric bursectomy  SUBJECTIVE:   SUBJECTIVE STATEMENT: Patient comes in today reporting that her gluteal area is doing pretty well.  She does not have pain bilaterally when she stands and does too much activity or when she sits too long in the car she reports after about 15 minutes of sitting she has increased tightness in her hips and pain down her bilateral hips she reports it is no worse on surgical side.  She does continue to have intermittent issues with her low back.  She has intermittent pain in her low back which affects her general mobility.   PERTINENT HISTORY: LBP  PAIN: 0/10  pain in hip 0/10 pain in lumbar in sitting  Pain location: low back pain  Pain description: aching  Aggravating factors: moving WB Relieving factors: rest,   PRECAUTIONS: Other: Hip Glute Med Repair  WEIGHT BEARING RESTRICTIONS: Yes    FALLS:  Has patient fallen in last 6 months? No  LIVING ENVIRONMENT: Lives with: lives with their family and lives with their spouse Lives in: House/apartment Stairs: Yes, 2 story Has following equipment at home: Environmental consultant - 4 wheeled and Wheelchair (manual)  OCCUPATION: retired  PLOF: Independent  PATIENT GOALS: Return to exercise and walking w/o pain  NEXT MD VISIT: 2 wk f/u  OBJECTIVE:   DIAGNOSTIC FINDINGS:  IMPRESSION: 1. Moderate tendinosis and probable partial tearing of the left gluteus minimus tendon with associated muscular atrophy. Mild gluteus medius tendinosis, left greater than right. 2. Mild degenerative changes of both hips and sacroiliac joints. No acute osseous  findings. 3. Chronic spondylosis in the visualized lower lumbar spine.    PATIENT SURVEYS:  FOTO 25 57 pts MCII 19 pts MCII   COGNITION: Overall cognitive status: Within functional limits for tasks assessed     SENSATION: WFL  POSTURE: rounded shoulders, increased thoracic kyphosis, and weight shift right  PALPATION: No TTP noted around the incision sight; aquacell bandage in place without signs of infection  LOWER EXTREMITY ROM:  Active ROM Right eval Left eval Left   Hip flexion WFL 90   Hip extension WFL 0   Hip abduction WFL N/A   Hip adduction WFL N/A   Hip internal rotation WFL N/A   Hip external rotation [facility] N/A   Knee flexion WFL 120   Knee extension WFL -5    (Blank rows = not tested)  LOWER EXTREMITY MMT:    MMT Right eval Left eval  Hip flexion 15.5 13.5  Hip extension    Hip abduction 17.6 13.1  Hip adduction    Hip internal rotation    Hip external rotation    Knee flexion    Knee extension 23.3 28.2  Ankle dorsiflexion    Ankle plantarflexion    Ankle inversion    Ankle eversion     (Blank rows = not tested)   FUNCTIONAL TESTS:  FUNCTIONAL TESTS:  5 times sit to stand: single STS performed on single leg on L, UE support needed   GAIT: Distance walked: 45ft Assistive device utilized: able to walk RW Comments:  decreased L stance time, decreased R step length, limited hip extension bilat, step to pattern     TODAY'S TREATMENT:                                                                                                                              DATE:  10/11  Nu-step: 5 minutes level 3 Reviewed entire HEP and how to use. Reviewed Foto  Seated bilateral hip abduction 3 x 10 red Seated long arc quad 2 x 15 red  Row red 2 x 15 Shoulder extension red 2 x 15 Showed how to put in a door.  All exercises performed with at the   9/6 LTRx20    Hip abduction 3x15 red Supine March 2x10     Seated LAQ 3x15 red   Standing  heel/toe x20  Standing slow  march 2x10   Reviewed HEP and worked on grading exercises.    8/23 Manual: Passive ROM within protocol limits in supine.  Trigger point release to lateral glutes and L sided lumbar paraspinals in R S/L'ing with pillow b/w knees.    8/15 Manual: Passive ROM within protocol limits in supine.  Trigger point release to lateral glutes and L sided lumbar paraspinals in R S/L'ing with pillow b/w knees.  Therapeutic Exercise:  SAQ 2x15 2lbs  LAQ 2x15 2lbs  Standing hip abduction left 2x10  Standing hip extension left 2x10   Step up 4 inch and 6 inch x10 each with rail  Sidestepping x 2 laps at rail Mini squats x 10 reps with UE support on rail   8/9 Manual: Passive ROM within protocol limits. Per protocol therapy began ER stretching today. Trigger point release to lateral glutes and IT band and lumbar spine, trigger point release to the lumbar spine    SAQ 2x15 2lbs   LAQ 2x15 2lbs   Standing hip abduction left 2x10  Standing hip extension left 2x10    Step up 4 inch 2x10    7/26 Manual: Passive ROM within protocol limits. Per protocol therapy began ER stretching today. Trigger point release to lateral glutes and IT band and lumbar spine, trigger point release to the lumbar spine   SAQ 2x15   LAQ 2x15 yellow band  Hip abdcution 3x10 yellow  Heel raise x20   Slow march 3x10    7/18 Manual: Passive ROM within protocol limits. Per protocol therapy began ER stretching today. Trigger point release to lateral glutes and IT band, trigger point release to the lumbar spine   Low march 2x10  Hip abduction red 2x10   Standing slow march 2x10  Mini squat 2x10    7/11 Manual: Passive ROM within protocol limits. Per protocol therapy began ER stretching today. Trigger point release to lateral glutes and IT band, trigger point release to the lumbar spine   SAQ 2x15   LAQ 2x15 yellow band  Hip abdcution 3x10 yellow  Heel raise x20   Slow  march         PATIENT EDUCATION:  Education details: precautions/protocol, AD usage, gait, cryotherapy, diagnosis, anatomy, exercise form, HEP, and POC.  Person educated: Patient Education method: Explanation, Demonstration, Tactile cues, Verbal cues, and Handouts Education comprehension: verbalized understanding, returned demonstration, verbal cues required, and tactile cues required   HOME EXERCISE PROGRAM:  Access Code: [email or web address] URL: [email or web address] Date: 02/14/2023 Prepared by: Zebedee Iba  ASSESSMENT:  ASSESSMENT:   CLINICAL IMPRESSION: The patient has made progress. She been packing to move. She does have pain in her back at times. She has been workibng on her exercises as much as she can. She has met her FOTO goal. Today we reviewed HEP. We reviewed how to use her exercises. We reviewed stretching for the lumbar spine and UE core stability exercises. She was given those so she has some variety in her exercises. We will see her for 1-2 more follow ups to finalize HEP.   OBJECTIVE IMPAIRMENTS Abnormal gait, decreased activity tolerance, decreased endurance, decreased mobility, difficulty walking, decreased ROM, decreased strength, hypomobility, increased muscle spasms, impaired flexibility, improper body mechanics, postural dysfunction, and pain.    ACTIVITY LIMITATIONS carrying, lifting, bending, sitting, standing, squatting, sleeping, stairs, transfers, and locomotion level   PARTICIPATION LIMITATIONS: cleaning, laundry, interpersonal relationship, driving, shopping, community activity, occupation, and exercise   PERSONAL FACTORS Age, Fitness, Time since onset of injury/illness/exacerbation, and  3+ comorbidities:  are also affecting patient's functional outcome.    REHAB POTENTIAL: Fair     CLINICAL DECISION MAKING: Stable/uncomplicated   EVALUATION COMPLEXITY: Low     GOALS:     SHORT TERM GOALS: Target date: 03/28/2023        Pt will  become independent with HEP in order to demonstrate synthesis of PT education.     Goal status: Patient independent with basic HEP at this time achieved 7/26   2.  Pt will be able to demonstrate full PROM on L hip in order to demonstrate functional improvement in LE function for progression to next phase of rehab.     Goal status: Mild limitations in left hip external rotation progressing 7/26   3.  Pt will score at least 19 pt increase on FOTO to demonstrate functional improvement in MCII and pt perceived function.      Goal status: INITIAL     LONG TERM GOALS: Target date: Foto goal achieved 7/26       Pt  will become independent with final HEP in order to demonstrate synthesis of PT education.    Goal status: Continue to expand HEP for   2.  Pt will be able to demonstrate full AROM of the L hip in all planes in order to demonstrate functional improvement in LE function for self-care and house hold duties.   Goal status: Still limitations in external rotation 7/26    3  Pt will score >/= 57 on FOTO to demonstrate improvement in perceived L hip function.           Reach Foto goal achieved   4.  Pt will be able to demonstrate ability to squat to parallel without pain in order to demonstrate functional improvement in LE function for self-care and house hold duties.    Goal status: Squatting improving progressing 7/26   5.  Pt will be able to demonstrate/report ability to walk >30 mins without pain in order to demonstrate functional improvement and tolerance to exercise and community mobility.    Goal status: Community distance ambulation improving 7/26     PLAN: PT FREQUENCY: 1-2x/week   PT DURATION: 12 weeks   PLANNED INTERVENTIONS:  Continue to progress weight bearing and general strengthening per protocol    Lorayne Bender PT DPT 08/09/23 1:30 PM

## 2023-08-16 ENCOUNTER — Ambulatory Visit (HOSPITAL_BASED_OUTPATIENT_CLINIC_OR_DEPARTMENT_OTHER): Payer: Medicare Other | Admitting: Physical Therapy

## 2023-08-16 ENCOUNTER — Encounter (HOSPITAL_BASED_OUTPATIENT_CLINIC_OR_DEPARTMENT_OTHER): Payer: Self-pay | Admitting: Physical Therapy

## 2023-08-16 DIAGNOSIS — M25552 Pain in left hip: Secondary | ICD-10-CM

## 2023-08-16 DIAGNOSIS — R262 Difficulty in walking, not elsewhere classified: Secondary | ICD-10-CM | POA: Diagnosis not present

## 2023-08-16 DIAGNOSIS — M6281 Muscle weakness (generalized): Secondary | ICD-10-CM | POA: Diagnosis not present

## 2023-08-16 NOTE — Therapy (Unsigned)
OUTPATIENT PHYSICAL THERAPY LOWER EXTREMITY TREATMENT NOTE   Patient Name: Margaret Graham MRN: [id number] DOB:1945-12-05, 77 y.o., female Today's Date: 08/16/2023  END OF SESSION:  PT End of Session - 08/16/23 1001     Visit Number 20    Number of Visits 21    Date for PT Re-Evaluation 09/06/23    Authorization Type MCR progress note at 10    PT Start Time 0845    PT Stop Time 0937    PT Time Calculation (min) 52 min    Activity Tolerance Patient tolerated treatment well    Behavior During Therapy WFL for tasks assessed/performed                 Past Medical History:  Diagnosis Date   Arthritis    GERD (gastroesophageal reflux disease)    Heart murmur    Hyperlipidemia    Hypothyroidism    Internal hemorrhoid    Osteopenia    Schatzki's ring    Thyroid disease    Thyroid nodule    right 1.7 cm   Tubular adenoma    Past Surgical History:  Procedure Laterality Date   ABDOMINAL HYSTERECTOMY     BREAST SURGERY     for dilated duct   CATARACT EXTRACTION Bilateral    GLUTEUS MINIMUS REPAIR Left 02/12/2023   Procedure: LEFT GLUTEUS MEDIUS REPAIR WITH POSSIBLE COLLAGEN PATCH PLACEMENT;  Surgeon: Huel Cote, MD;  Location: [facility];  Service: Orthopedics;  Laterality: Left;   left thyroid lobectomy  2004   MYRINGOTOMY WITH TUBE PLACEMENT Left 12/19/2022   Procedure: MYRINGOTOMY WITH TUBE PLACEMENT;  Surgeon: Laren Boom, DO;  Location: [facility];  Service: ENT;  Laterality: Left;   Patient Active Problem List   Diagnosis Date Noted   Tear of left gluteus medius tendon 02/12/2023   Strain of gluteus medius 02/06/2023   Chronic serous otitis media 12/19/2022   Osteopenia 01/23/2016   Dyslipidemia 01/18/2016   History of esophageal stricture 01/18/2016   Postoperative hypothyroidism 01/18/2016   Esophageal reflux 01/18/2016   Snoring 01/18/2016     Days since surgery: 185  PCP: Adrian Prince, MD   REFERRING PROVIDER:  Huel Cote, MD   REFERRING DIAG:  S76.012A (ICD-10-CM) - Tear of left gluteus medius tendon, initial encounter      THERAPY DIAG:  Pain in left hip  Muscle weakness (generalized)  Difficulty walking  Rationale for Evaluation and Treatment: Rehabilitation  ONSET DATE: Days since surgery: Days since surgery: 185   PROCEDURE: 1. Left hip gluteus medius repair with collagen patch augmentation 2. Left hip trochanteric bursectomy  SUBJECTIVE:   SUBJECTIVE STATEMENT: Patient continues to have intermittent pain when she ambulates in bilateral hips and lower back.  She reports is improved.  She has more difficulty with her lower back.  PERTINENT HISTORY: LBP  PAIN: 0/10  pain in hip 0/10 pain in lumbar in sitting  Pain location: low back pain  Pain description: aching  Aggravating factors: moving WB Relieving factors: rest,   PRECAUTIONS: Other: Hip Glute Med Repair  WEIGHT BEARING RESTRICTIONS: Yes    FALLS:  Has patient fallen in last 6 months? No  LIVING ENVIRONMENT: Lives with: lives with their family and lives with their spouse Lives in: House/apartment Stairs: Yes, 2 story Has following equipment at home: Environmental consultant - 4 wheeled and Wheelchair (manual)  OCCUPATION: retired  PLOF: Independent  PATIENT GOALS: Return to exercise and walking w/o pain  NEXT MD VISIT: 2 wk  f/u  OBJECTIVE:   DIAGNOSTIC FINDINGS:  IMPRESSION: 1. Moderate tendinosis and probable partial tearing of the left gluteus minimus tendon with associated muscular atrophy. Mild gluteus medius tendinosis, left greater than right. 2. Mild degenerative changes of both hips and sacroiliac joints. No acute osseous findings. 3. Chronic spondylosis in the visualized lower lumbar spine.    PATIENT SURVEYS:  FOTO 25 57 pts MCII 19 pts MCII   COGNITION: Overall cognitive status: Within functional limits for tasks assessed     SENSATION: WFL  POSTURE: rounded shoulders, increased  thoracic kyphosis, and weight shift right  PALPATION: No TTP noted around the incision sight; aquacell bandage in place without signs of infection  LOWER EXTREMITY ROM:  Active ROM Right eval Left eval Left   Hip flexion WFL 90   Hip extension WFL 0   Hip abduction WFL N/A   Hip adduction WFL N/A   Hip internal rotation WFL N/A   Hip external rotation [facility] N/A   Knee flexion WFL 120   Knee extension WFL -5    (Blank rows = not tested)  LOWER EXTREMITY MMT:    MMT Right eval Left eval  Hip flexion 15.5 13.5  Hip extension    Hip abduction 17.6 13.1  Hip adduction    Hip internal rotation    Hip external rotation    Knee flexion    Knee extension 23.3 28.2  Ankle dorsiflexion    Ankle plantarflexion    Ankle inversion    Ankle eversion     (Blank rows = not tested)   FUNCTIONAL TESTS:  FUNCTIONAL TESTS:  5 times sit to stand: single STS performed on single leg on L, UE support needed   GAIT: Distance walked: 35ft Assistive device utilized: able to walk RW Comments:  decreased L stance time, decreased R step length, limited hip extension bilat, step to pattern     TODAY'S TREATMENT:                                                                                                                              DATE:  10/18 Reviewed final HEP and how to use at home. Reviewed overall goals.  Supine march 2 x 10 Supine ball squeeze with abdominal breathing 2 x 10 LTR x 20 LAQ 2 x 10  Standing heel raise x 20 Standing low march with upper extremity support 2 x 10  10/11  Nu-step: 5 minutes level 3 Reviewed entire HEP and how to use. Reviewed Foto  Seated bilateral hip abduction 3 x 10 red Seated long arc quad 2 x 15 red  Row red 2 x 15 Shoulder extension red 2 x 15 Showed how to put in a door.  All exercises performed with at the   9/6 LTRx20    Hip abduction 3x15 red Supine March 2x10     Seated LAQ 3x15 red   Standing heel/toe x20   Standing slow march 2x10  Reviewed HEP and worked on grading exercises.    8/23 Manual: Passive ROM within protocol limits in supine.  Trigger point release to lateral glutes and L sided lumbar paraspinals in R S/L'ing with pillow b/w knees.            PATIENT EDUCATION:  Education details: precautions/protocol, AD usage, gait, cryotherapy, diagnosis, anatomy, exercise form, HEP, and POC.  Person educated: Patient Education method: Explanation, Demonstration, Tactile cues, Verbal cues, and Handouts Education comprehension: verbalized understanding, returned demonstration, verbal cues required, and tactile cues required   HOME EXERCISE PROGRAM:  Access Code: [email or web address] URL: [email or web address] Date: 02/14/2023 Prepared by: Zebedee Iba  ASSESSMENT:  ASSESSMENT:   CLINICAL IMPRESSION: The patient has reached max benefit for physical therapy at this time.  She continues to have some pain when walking distances and performing functional tasks for greater than 15 to 20 minutes.  The patient has good range of motion.  Her strength is progressing.  Her exercise program is somewhat limited by her low back.  She has things to work on neck and help both her low back and her hips.  She will be moving to [facility] soon.  When she gets at at [facility] she will find the lumbar spine doctor.  OBJECTIVE IMPAIRMENTS Abnormal gait, decreased activity tolerance, decreased endurance, decreased mobility, difficulty walking, decreased ROM, decreased strength, hypomobility, increased muscle spasms, impaired flexibility, improper body mechanics, postural dysfunction, and pain.    ACTIVITY LIMITATIONS carrying, lifting, bending, sitting, standing, squatting, sleeping, stairs, transfers, and locomotion level   PARTICIPATION LIMITATIONS: cleaning, laundry, interpersonal relationship, driving, shopping, community activity, occupation, and exercise   PERSONAL FACTORS Age, Fitness, Time since  onset of injury/illness/exacerbation, and 3+ comorbidities:  are also affecting patient's functional outcome.    REHAB POTENTIAL: Fair     CLINICAL DECISION MAKING: Stable/uncomplicated   EVALUATION COMPLEXITY: Low     GOALS:     SHORT TERM GOALS: Target date: 03/28/2023        Pt will become independent with HEP in order to demonstrate synthesis of PT education.     Goal status: Patient independent with basic HEP at this time achieved 10/15   2.  Pt will be able to demonstrate full PROM on L hip in order to demonstrate functional improvement in LE function for progression to next phase of rehab.     Goal status: Mild limitations in left hip external rotation progressing    3.  Pt will score at least 19 pt increase on FOTO to demonstrate functional improvement in MCII and pt perceived function.      Goal status: Achieved 10/16     LONG TERM GOALS: Target date: Foto goal achieved 7/26       Pt  will become independent with final HEP in order to demonstrate synthesis of PT education.    Goal status: Continue to expand HEP for   2.  Pt will be able to demonstrate full AROM of the L hip in all planes in order to demonstrate functional improvement in LE function for self-care and house hold duties.   Goal status: Still limitations in external rotation achieved to 10/16    3  Pt will score >/= 57 on FOTO to demonstrate improvement in perceived L hip function.           Reach Foto goal achieved achieved 10/16   4.  Pt will be able to demonstrate ability to squat to parallel without pain in order to demonstrate functional improvement in  LE function for self-care and house hold duties.    Goal status:  can squat with mild pain achieved 10/16   5.  Pt will be able to demonstrate/report ability to walk >30 mins without pain in order to demonstrate functional improvement and tolerance to exercise and community mobility.    Goal status: Community distance ambulation  improving 7/26     PLAN: PT FREQUENCY: 1-2x/week   PT DURATION: 12 weeks   PLANNED INTERVENTIONS:  Continue to progress weight bearing and general strengthening per protocol    Lorayne Bender PT DPT 08/16/23 10:04 AM

## 2023-08-18 ENCOUNTER — Encounter (HOSPITAL_BASED_OUTPATIENT_CLINIC_OR_DEPARTMENT_OTHER): Payer: Self-pay | Admitting: Physical Therapy

## 2023-08-19 ENCOUNTER — Telehealth: Payer: Self-pay

## 2023-08-19 DIAGNOSIS — Z820 Family history of epilepsy and other diseases of the nervous system: Secondary | ICD-10-CM

## 2023-08-19 DIAGNOSIS — R351 Nocturia: Secondary | ICD-10-CM

## 2023-08-19 DIAGNOSIS — Z9189 Other specified personal risk factors, not elsewhere classified: Secondary | ICD-10-CM

## 2023-08-19 DIAGNOSIS — R0683 Snoring: Secondary | ICD-10-CM

## 2023-08-19 DIAGNOSIS — E663 Overweight: Secondary | ICD-10-CM

## 2023-08-19 NOTE — Addendum Note (Signed)
Addended by: Bertram Savin on: 08/19/2023 03:23 PM   Modules accepted: Orders

## 2023-08-19 NOTE — Telephone Encounter (Signed)
HST ordered.

## 2023-08-19 NOTE — Telephone Encounter (Signed)
No inlab appt available before patient moves to [place]. Patient is okay with having an HST. Please get an HST order from Dr. Peggye Ley.   Pt is scheduled to pick up HST on 11/6 at 3:30pm

## 2023-09-04 ENCOUNTER — Ambulatory Visit (INDEPENDENT_AMBULATORY_CARE_PROVIDER_SITE_OTHER): Payer: Medicare Other | Admitting: Neurology

## 2023-09-04 ENCOUNTER — Ambulatory Visit (HOSPITAL_BASED_OUTPATIENT_CLINIC_OR_DEPARTMENT_OTHER): Payer: Medicare Other | Admitting: Orthopaedic Surgery

## 2023-09-04 DIAGNOSIS — M7061 Trochanteric bursitis, right hip: Secondary | ICD-10-CM | POA: Diagnosis not present

## 2023-09-04 DIAGNOSIS — G4733 Obstructive sleep apnea (adult) (pediatric): Secondary | ICD-10-CM | POA: Diagnosis not present

## 2023-09-04 DIAGNOSIS — Z9189 Other specified personal risk factors, not elsewhere classified: Secondary | ICD-10-CM

## 2023-09-04 DIAGNOSIS — R351 Nocturia: Secondary | ICD-10-CM

## 2023-09-04 DIAGNOSIS — Z820 Family history of epilepsy and other diseases of the nervous system: Secondary | ICD-10-CM

## 2023-09-04 DIAGNOSIS — E663 Overweight: Secondary | ICD-10-CM

## 2023-09-04 DIAGNOSIS — M25551 Pain in right hip: Secondary | ICD-10-CM | POA: Diagnosis not present

## 2023-09-04 DIAGNOSIS — R0683 Snoring: Secondary | ICD-10-CM

## 2023-09-04 MED ORDER — LIDOCAINE HCL 1 % IJ SOLN
4.0000 mL | INTRAMUSCULAR | Status: AC | PRN
Start: 2023-09-04 — End: 2023-09-04
  Administered 2023-09-04: 4 mL

## 2023-09-04 MED ORDER — TRIAMCINOLONE ACETONIDE 40 MG/ML IJ SUSP
80.0000 mg | INTRAMUSCULAR | Status: AC | PRN
Start: 2023-09-04 — End: 2023-09-04
  Administered 2023-09-04: 80 mg via INTRA_ARTICULAR

## 2023-09-04 NOTE — Progress Notes (Signed)
Post Operative Evaluation    Procedure/Date of Surgery: Left gluteus medius repair 02/12/23  Interval History:    Presents today for follow-up of the left hip.  She is slowly making progress although she feels she has plateaued.  She has previously been seeing Dr. Yetta Barre who has recommended lumbar decompression in the setting of the spondylolisthesis with subsequent neuroforaminal stenosis.  She is not ready for spine surgery at this time as she does have plans to eventually move to the [place] area.  She is having soreness about the left as well as the right hip particularly going from sitting for a while to standing.   PMH/PSH/Family History/Social History/Meds/Allergies:    Past Medical History:  Diagnosis Date  . Arthritis   . GERD (gastroesophageal reflux disease)   . Heart murmur   . Hyperlipidemia   . Hypothyroidism   . Internal hemorrhoid   . Osteopenia   . Schatzki's ring   . Thyroid disease   . Thyroid nodule    right 1.7 cm  . Tubular adenoma    Past Surgical History:  Procedure Laterality Date  . ABDOMINAL HYSTERECTOMY    . BREAST SURGERY     for dilated duct  . CATARACT EXTRACTION Bilateral   . GLUTEUS MINIMUS REPAIR Left 02/12/2023   Procedure: LEFT GLUTEUS MEDIUS REPAIR WITH POSSIBLE COLLAGEN PATCH PLACEMENT;  Surgeon: Huel Cote, MD;  Location: [facility];  Service: Orthopedics;  Laterality: Left;  . left thyroid lobectomy  2004  . MYRINGOTOMY WITH TUBE PLACEMENT Left 12/19/2022   Procedure: MYRINGOTOMY WITH TUBE PLACEMENT;  Surgeon: Laren Boom, DO;  Location: [facility] OR;  Service: ENT;  Laterality: Left;   Social History   Socioeconomic History  . Marital status: Married    Spouse name: Not on file  . Number of children: 2  . Years of education: Not on file  . Highest education level: Not on file  Occupational History  . Not on file  Tobacco Use  . Smoking status: Never  . Smokeless  tobacco: Never  Vaping Use  . Vaping status: Never Used  Substance and Sexual Activity  . Alcohol use: Yes    Alcohol/week: 7.0 standard drinks of alcohol    Types: 7 Glasses of wine per week    Comment: 1 glass a day typically  . Drug use: No  . Sexual activity: Not Currently    Partners: Male  Other Topics Concern  . Not on file  Social History Narrative   Retired from [employer].   Moved to [facility] 07/2015.   From [place].   Social Determinants of Health   Financial Resource Strain: Not on file  Food Insecurity: Not on file  Transportation Needs: Not on file  Physical Activity: Not on file  Stress: Not on file  Social Connections: Not on file   Family History  Problem Relation Age of Onset  . Transient ischemic attack Mother   . Arthritis Mother   . Transient ischemic attack Father   . Arthritis Father   . Hyperlipidemia Father   . Thyroid disease Sister   . Diabetes Son   . Asthma Son   . Heart disease Paternal Uncle   . Colon cancer Neg Hx   . Esophageal cancer Neg Hx   . Rectal cancer Neg Hx   .  Sleep apnea Neg Hx    Allergies  Allergen Reactions  . Declomycin [Demeclocycline]    Current Outpatient Medications  Medication Sig Dispense Refill  . alendronate (FOSAMAX) 70 MG tablet Take 70 mg by mouth once a week. Take with a full glass of water on an empty stomach.    . Ascorbic Acid (VITAMIN C) 1000 MG tablet Take 1,000 mg by mouth daily.    . ASPIRIN 81 PO Take by mouth daily.    . Calcium Carb-Cholecalciferol (CALCIUM 600 + D PO) Take by mouth.    . Carboxymethylcellulose Sodium (REFRESH PLUS OP) Place 1 drop into both eyes daily. PF    . ezetimibe (ZETIA) 10 MG tablet Take 10 mg by mouth daily.    . famotidine (PEPCID) 40 MG tablet Take 1 tablet (40 mg total) by mouth at bedtime. 90 tablet 0  . levothyroxine (SYNTHROID, LEVOTHROID) 100 MCG tablet Take 1 tablet (100 mcg total) by mouth every morning. 30 tablet 3  . Multiple Vitamin (MULTIVITAMIN) capsule Take 1  capsule by mouth daily.    . Multiple Vitamins-Minerals (PRESERVISION AREDS 2) CAPS Take 1 tablet by mouth 2 (two) times daily.    . simvastatin (ZOCOR) 20 MG tablet Take 1 tablet (20 mg total) by mouth daily. 30 tablet 0   Current Facility-Administered Medications  Medication Dose Route Frequency Provider Last Rate Last Admin  . 0.9 %  sodium chloride infusion  500 mL Intravenous Once Sherrilyn Rist, MD       No results found.  Review of Systems:   A ROS was performed including pertinent positives and negatives as documented in the HPI.   Musculoskeletal Exam:    There were no vitals taken for this visit.  Incision is well-appearing without erythema or drainage.  She has some tenderness about the quadriceps and the vastus lateralis.  No tenderness about the lateral trochanter.  There is some weakness with resisted abduction.  She is walking with a mildly antalgic gait.  Remainder of distal neurosensory exam is intact  Imaging:      I personally reviewed and interpreted the radiographs.   Assessment:   6 months status post left hip gluteus medius repair with soreness of the left as well as the right.  At today's visit I did recommend an ultrasound-guided injection of the right hip.  I do believe that given the fact that she is not currently able to undergo surgical decompression with Dr. Yetta Barre for her lumbar spine that I do believe she would benefit from a lumbar epidural injection.  I will plan to refer her to Dr. Alvester Morin for this.  I did also offer a right ultrasound-guided gluteus injection which she is elected for today.  I will plan to see her back in 3 months  Plan :    -Right hip ultrasound-guided injection provided verbal consent obtained     Procedure Note  Patient: Margaret Graham             Date of Birth: Mar 27, 1946           MRN: [id number]             Visit Date: 09/04/2023  Procedures: Visit Diagnoses:  1. Trochanteric bursitis, right hip      Large Joint Inj: R hip joint on 09/04/2023 5:28 PM Indications: pain Details: 22 G 3.5 in needle, ultrasound-guided anterolateral approach  Arthrogram: No  Medications: 4 mL lidocaine 1 %; 80 mg triamcinolone acetonide 40 MG/ML Outcome:  tolerated well, no immediate complications Procedure, treatment alternatives, risks and benefits explained, specific risks discussed. Consent was given by the patient. Immediately prior to procedure a time out was called to verify the correct patient, procedure, equipment, support staff and site/side marked as required. Patient was prepped and draped in the usual sterile fashion.        I personally saw and evaluated the patient, and participated in the management and treatment plan.  Huel Cote, MD Attending Physician, Orthopedic Surgery  This document was dictated using Dragon voice recognition software. A reasonable attempt at proof reading has been made to minimize errors.

## 2023-09-05 NOTE — Progress Notes (Signed)
See procedure note.

## 2023-09-09 NOTE — Procedures (Signed)
   [facility]  HOME SLEEP TEST (Watch PAT) REPORT  STUDY DATE: 09/04/2023  DOB: 10/31/1945  MRN: [id number]  ORDERING CLINICIAN: Huston Foley, MD, PhD   REFERRING CLINICIAN: Adrian Prince, MD   CLINICAL INFORMATION/HISTORY:  77 year old female with an underlying medical history of arthritis, reflux disease, hyperlipidemia, hypothyroidism, and heart murmur, who reports loud snoring for the past several years.    Epworth sleepiness score: 2/24.  BMI: 25.6 kg/m  FINDINGS:   Sleep Summary:   Total Recording Time (hours, min): 8 hours, 21 min  Total Sleep Time (hours, min):  7 hours, 0 min  Percent REM (%):    32.3%   Respiratory Indices:   Calculated pAHI (per hour):  19.5/hour (8.5/h, utilizing the 4% desaturation criteria for hypopneas per Medicare guidelines)         REM pAHI:    24.1/hour       NREM pAHI: 17.3/hour  Central pAHI: 4.3/hour  Oxygen Saturation Statistics:    Oxygen Saturation (%) Mean: 93%   Minimum oxygen saturation (%):                 88%   O2 Saturation Range (%): 88 - 99%    O2 Saturation (minutes) <=88%: 0 min  Pulse Rate Statistics:   Pulse Mean (bpm):    64/min    Pulse Range (51 - 111/min)   IMPRESSION: OSA (obstructive sleep apnea)   RECOMMENDATION:  This home sleep test demonstrates moderate obstructive sleep apnea with a total AHI of 19.5/hour and O2 nadir of 88%.  Variable snoring was detected, ranging from mild to louder, at times mild and intermittent. Treatment with a positive airway pressure (PAP) device is recommended. The patient will be advised to proceed with an autoPAP titration/trial at home for now. A full night titration study may be considered to optimize treatment settings, monitor proper oxygen saturations and aid with improvement of tolerance and adherence, if needed down the road. Alternative treatment options may include a dental device through dentistry or orthodontics in selected patients or  Inspire (hypoglossal nerve stimulator) in carefully selected patients (meeting inclusion criteria).  Concomitant weight loss is recommended (where clinically appropriate). Please note that untreated obstructive sleep apnea may carry additional perioperative morbidity. Patients with significant obstructive sleep apnea should receive perioperative PAP therapy and the surgeons and particularly the anesthesiologist should be informed of the diagnosis and the severity of the sleep disordered breathing. The patient should be cautioned not to drive, work at heights, or operate dangerous or heavy equipment when tired or sleepy. Review and reiteration of good sleep hygiene measures should be pursued with any patient. Other causes of the patient's symptoms, including circadian rhythm disturbances, an underlying mood disorder, medication effect and/or an underlying medical problem cannot be ruled out based on this test. Clinical correlation is recommended.  The patient and her referring provider will be notified of the test results. The patient will be seen in follow up in sleep clinic at [facility].  I certify that I have reviewed the raw data recording prior to the issuance of this report in accordance with the standards of the American Academy of Sleep Medicine (AASM).    INTERPRETING PHYSICIAN:   Huston Foley, MD, PhD Medical Director, [facility]) Diplomat, ABPN (Neurology and Sleep)   [facility] [address] [place], [place] [address] [phone]

## 2023-09-09 NOTE — Addendum Note (Signed)
Addended by: Huston Foley on: 09/09/2023 04:34 PM   Modules accepted: Orders

## 2023-09-11 ENCOUNTER — Telehealth: Payer: Self-pay | Admitting: *Deleted

## 2023-09-11 ENCOUNTER — Encounter: Payer: Medicare Other | Admitting: Physical Medicine and Rehabilitation

## 2023-09-11 NOTE — Telephone Encounter (Signed)
-----   Message from Huston Foley sent at 09/09/2023  4:34 PM EST ----- Patient referred by PCP, seen by me on 07/23/2023, patient had a HST on 09/04/2023.    Please call and notify the patient that the recent home sleep test showed obstructive sleep apnea in the moderate range. I recommend treatment in the form of autoPAP, which means, that we don't have to bring her in for a sleep study with CPAP, but will let her start using a so called autoPAP machine at home, which is a CPAP-like machine with self-adjusting pressures. We will send the order to a local DME company (of her choice, or as per insurance requirement). The DME representative will fit her with a mask, educate her on how to use the machine, how to put the mask on, etc. I have placed an order in the chart. Please send the order, talk to patient, send report to referring MD. We will need a FU in sleep clinic for 10 weeks post-PAP set up, please arrange that with me or one of our NPs. Also reinforce the need for compliance with treatment. Thanks,   Huston Foley, MD, PhD [facility])

## 2023-09-11 NOTE — Telephone Encounter (Signed)
I called the patient and discussed her sleep study results.  The patient is amenable to proceeding with AutoPap but she did say that she will be moving to [place] in 2 to 3 months.  She is wondering if she should go ahead and try to get set up or is it best for her to wait until she is in [place] and can establish with a new doctor who can get her AutoPap.  She said if she gets set up on AutoPap and then moves she would be able to come back to [facility] for her initial follow-up appointment.   Spoke with Dr. Frances Furbish and also the patient. Per Dr Frances Furbish, should be ok to wait but if patient would like to be setup now we can do that.  The ultimate decision was made by the patient to wait until she gets settled in [place] with another doctor who can order her autopap. She will plan to get her records from our office when they are available for pickup from medical records. Patient was appreciative and her questions were answered.

## 2023-09-11 NOTE — Telephone Encounter (Signed)
I agree, that would probably be easiest and is clinically acceptable at this time.  At least her sleep test did not show any significant/serious desaturations, if she had more evidence of severe desaturations or evidence of nocturnal hypoxemia I would probably push for her to get started on treatment sooner than later.

## 2023-09-12 ENCOUNTER — Other Ambulatory Visit: Payer: Self-pay

## 2023-09-12 MED ORDER — FAMOTIDINE 40 MG PO TABS: 40.0000 mg | ORAL_TABLET | Freq: Every day | ORAL | 0 refills | Status: DC

## 2023-09-16 ENCOUNTER — Other Ambulatory Visit: Payer: Self-pay

## 2023-09-16 ENCOUNTER — Ambulatory Visit: Payer: Medicare Other | Admitting: Physical Medicine and Rehabilitation

## 2023-09-16 DIAGNOSIS — M5416 Radiculopathy, lumbar region: Secondary | ICD-10-CM

## 2023-09-16 MED ORDER — METHYLPREDNISOLONE ACETATE 40 MG/ML IJ SUSP
40.0000 mg | Freq: Once | INTRAMUSCULAR | Status: AC
Start: 2023-09-16 — End: 2023-09-16
  Administered 2023-09-16: 40 mg

## 2023-09-16 NOTE — Patient Instructions (Signed)

## 2023-09-18 ENCOUNTER — Encounter (HOSPITAL_BASED_OUTPATIENT_CLINIC_OR_DEPARTMENT_OTHER): Payer: Self-pay | Admitting: Orthopaedic Surgery

## 2023-09-23 DIAGNOSIS — Z23 Encounter for immunization: Secondary | ICD-10-CM | POA: Diagnosis not present

## 2023-09-23 NOTE — Procedures (Signed)
Lumbar Epidural Steroid Injection - Interlaminar Approach with Fluoroscopic Guidance  Patient: Margaret Graham      Date of Birth: [birth year] MRN: [id number] PCP: Adrian Prince, MD      Visit Date: 09/16/2023   Universal Protocol:     Consent Given By: the patient  Position: PRONE  Additional Comments: Vital signs were monitored before and after the procedure. Patient was prepped and draped in the usual sterile fashion. The correct patient, procedure, and site was verified.   Injection Procedure Details:   Procedure diagnoses: Lumbar radiculopathy [M54.16]   Meds Administered:  Meds ordered this encounter  Medications   methylPREDNISolone acetate (DEPO-MEDROL) injection 40 mg     Laterality: Left  Location/Site:  L4-5  Needle: 3.5 in., 20 ga. Tuohy  Needle Placement: Paramedian epidural  Findings:   -Comments: Excellent flow of contrast into the epidural space.  Procedure Details: Using a paramedian approach from the side mentioned above, the region overlying the inferior lamina was localized under fluoroscopic visualization and the soft tissues overlying this structure were infiltrated with 4 ml. of 1% Lidocaine without Epinephrine. The Tuohy needle was inserted into the epidural space using a paramedian approach.   The epidural space was localized using loss of resistance along with counter oblique bi-planar fluoroscopic views.  After negative aspirate for air, blood, and CSF, a 2 ml. volume of Isovue-250 was injected into the epidural space and the flow of contrast was observed. Radiographs were obtained for documentation purposes.    The injectate was administered into the level noted above.   Additional Comments:  No complications occurred Dressing: 2 x 2 sterile gauze and Band-Aid    Post-procedure details: Patient was observed during the procedure. Post-procedure instructions were reviewed.  Patient left the clinic in stable condition.

## 2023-09-23 NOTE — Progress Notes (Signed)
Margaret Graham - 77 y.o. female MRN [id number]  Date of birth: 1945-12-17  Office Visit Note: Visit Date: 09/16/2023 PCP: Adrian Prince, MD Referred by: Adrian Prince, MD  Subjective: Chief Complaint  Patient presents with   Lower Back - Pain   HPI:  Margaret Graham is a 77 y.o. female who comes in today at the request of Dr. Huel Cote for planned Left L4-5 Lumbar Interlaminar epidural steroid injection with fluoroscopic guidance.  The patient has failed conservative care including home exercise, medications, time and activity modification.  This injection will be diagnostic and hopefully therapeutic.  Please see requesting physician notes for further details and justification.   ROS Otherwise per HPI.  Assessment & Plan: Visit Diagnoses:    ICD-10-CM   1. Lumbar radiculopathy  M54.16 XR C-ARM NO REPORT    Epidural Steroid injection    methylPREDNISolone acetate (DEPO-MEDROL) injection 40 mg      Plan: No additional findings.   Meds & Orders:  Meds ordered this encounter  Medications   methylPREDNISolone acetate (DEPO-MEDROL) injection 40 mg    Orders Placed This Encounter  Procedures   XR C-ARM NO REPORT   Epidural Steroid injection    Follow-up: Return for visit to requesting provider as needed.   Procedures: No procedures performed  Lumbar Epidural Steroid Injection - Interlaminar Approach with Fluoroscopic Guidance  Patient: Margaret Graham      Date of Birth: [birth year] MRN: [id number] PCP: Adrian Prince, MD      Visit Date: 09/16/2023   Universal Protocol:     Consent Given By: the patient  Position: PRONE  Additional Comments: Vital signs were monitored before and after the procedure. Patient was prepped and draped in the usual sterile fashion. The correct patient, procedure, and site was verified.   Injection Procedure Details:   Procedure diagnoses: Lumbar radiculopathy [M54.16]   Meds Administered:  Meds ordered  this encounter  Medications   methylPREDNISolone acetate (DEPO-MEDROL) injection 40 mg     Laterality: Left  Location/Site:  L4-5  Needle: 3.5 in., 20 ga. Tuohy  Needle Placement: Paramedian epidural  Findings:   -Comments: Excellent flow of contrast into the epidural space.  Procedure Details: Using a paramedian approach from the side mentioned above, the region overlying the inferior lamina was localized under fluoroscopic visualization and the soft tissues overlying this structure were infiltrated with 4 ml. of 1% Lidocaine without Epinephrine. The Tuohy needle was inserted into the epidural space using a paramedian approach.   The epidural space was localized using loss of resistance along with counter oblique bi-planar fluoroscopic views.  After negative aspirate for air, blood, and CSF, a 2 ml. volume of Isovue-250 was injected into the epidural space and the flow of contrast was observed. Radiographs were obtained for documentation purposes.    The injectate was administered into the level noted above.   Additional Comments:  No complications occurred Dressing: 2 x 2 sterile gauze and Band-Aid    Post-procedure details: Patient was observed during the procedure. Post-procedure instructions were reviewed.  Patient left the clinic in stable condition.   Clinical History: Lumbar MRI June 2022, [facility], By my view shows DDD with grade I listhesis of L3 on L4 and mild to moderate central and lateral recess stenosis at L4-5     Objective:  VS:  HT:    WT:   BMI:     BP:   HR: bpm  TEMP: ( )  RESP:  Physical Exam  Vitals and nursing note reviewed.  Constitutional:      General: She is not in acute distress.    Appearance: Normal appearance. She is well-developed. She is not ill-appearing.  HENT:     Head: Normocephalic and atraumatic.  Eyes:     Conjunctiva/sclera: Conjunctivae normal.     Pupils: Pupils are equal, round, and reactive to light.   Cardiovascular:     Rate and Rhythm: Normal rate.     Pulses: Normal pulses.  Pulmonary:     Effort: Pulmonary effort is normal.  Musculoskeletal:     Right lower leg: No edema.     Left lower leg: No edema.  Skin:    General: Skin is warm and dry.     Findings: No erythema or rash.  Neurological:     General: No focal deficit present.     Mental Status: She is alert and oriented to person, place, and time.     Sensory: No sensory deficit.     Motor: No abnormal muscle tone.     Coordination: Coordination normal.     Gait: Gait normal.  Psychiatric:        Mood and Affect: Mood normal.        Behavior: Behavior normal.      Imaging: No results found.

## 2023-10-02 ENCOUNTER — Ambulatory Visit (HOSPITAL_BASED_OUTPATIENT_CLINIC_OR_DEPARTMENT_OTHER): Payer: Medicare Other | Admitting: Orthopaedic Surgery

## 2023-10-02 DIAGNOSIS — M7061 Trochanteric bursitis, right hip: Secondary | ICD-10-CM | POA: Diagnosis not present

## 2023-10-02 NOTE — Progress Notes (Signed)
Post Operative Evaluation    Procedure/Date of Surgery: Left gluteus medius repair 02/12/23  Interval History:   10/02/2023: Presents today for further discussion of her right hip.  She has pending treatment of her lumbar spine as well.  If currently decided to stay in [place] for the time being.  She has pain about the lateral aspect of the hip with weightbearing.  This does occasionally radiate down the leg as well.   PMH/PSH/Family History/Social History/Meds/Allergies:    Past Medical History:  Diagnosis Date   Arthritis    GERD (gastroesophageal reflux disease)    Heart murmur    Hyperlipidemia    Hypothyroidism    Internal hemorrhoid    Osteopenia    Schatzki's ring    Thyroid disease    Thyroid nodule    right 1.7 cm   Tubular adenoma    Past Surgical History:  Procedure Laterality Date   ABDOMINAL HYSTERECTOMY     BREAST SURGERY     for dilated duct   CATARACT EXTRACTION Bilateral    GLUTEUS MINIMUS REPAIR Left 02/12/2023   Procedure: LEFT GLUTEUS MEDIUS REPAIR WITH POSSIBLE COLLAGEN PATCH PLACEMENT;  Surgeon: Huel Cote, MD;  Location: [facility];  Service: Orthopedics;  Laterality: Left;   left thyroid lobectomy  2004   MYRINGOTOMY WITH TUBE PLACEMENT Left 12/19/2022   Procedure: MYRINGOTOMY WITH TUBE PLACEMENT;  Surgeon: Laren Boom, DO;  Location: [facility];  Service: ENT;  Laterality: Left;   Social History   Socioeconomic History   Marital status: Married    Spouse name: Not on file   Number of children: 2   Years of education: Not on file   Highest education level: Not on file  Occupational History   Not on file  Tobacco Use   Smoking status: Never   Smokeless tobacco: Never  Vaping Use   Vaping status: Never Used  Substance and Sexual Activity   Alcohol use: Yes    Alcohol/week: 7.0 standard drinks of alcohol    Types: 7 Glasses of wine per week    Comment: 1 glass a day  typically   Drug use: No   Sexual activity: Not Currently    Partners: Male  Other Topics Concern   Not on file  Social History Narrative   Retired from [employer].   Moved to [facility] 07/2015.   From [place].   Social Determinants of Health   Financial Resource Strain: Not on file  Food Insecurity: Not on file  Transportation Needs: Not on file  Physical Activity: Not on file  Stress: Not on file  Social Connections: Not on file   Family History  Problem Relation Age of Onset   Transient ischemic attack Mother    Arthritis Mother    Transient ischemic attack Father    Arthritis Father    Hyperlipidemia Father    Thyroid disease Sister    Diabetes Son    Asthma Son    Heart disease Paternal Uncle    Colon cancer Neg Hx    Esophageal cancer Neg Hx    Rectal cancer Neg Hx    Sleep apnea Neg Hx    Allergies  Allergen Reactions   Declomycin [Demeclocycline]    Current Outpatient Medications  Medication Sig Dispense Refill   alendronate (FOSAMAX) 70  MG tablet Take 70 mg by mouth once a week. Take with a full glass of water on an empty stomach.     Ascorbic Acid (VITAMIN C) 1000 MG tablet Take 1,000 mg by mouth daily.     ASPIRIN 81 PO Take by mouth daily.     Calcium Carb-Cholecalciferol (CALCIUM 600 + D PO) Take by mouth.     Carboxymethylcellulose Sodium (REFRESH PLUS OP) Place 1 drop into both eyes daily. PF     ezetimibe (ZETIA) 10 MG tablet Take 10 mg by mouth daily.     famotidine (PEPCID) 40 MG tablet Take 1 tablet (40 mg total) by mouth at bedtime. 90 tablet 0   levothyroxine (SYNTHROID, LEVOTHROID) 100 MCG tablet Take 1 tablet (100 mcg total) by mouth every morning. 30 tablet 3   Multiple Vitamin (MULTIVITAMIN) capsule Take 1 capsule by mouth daily.     Multiple Vitamins-Minerals (PRESERVISION AREDS 2) CAPS Take 1 tablet by mouth 2 (two) times daily.     simvastatin (ZOCOR) 20 MG tablet Take 1 tablet (20 mg total) by mouth daily. 30 tablet 0   Current  Facility-Administered Medications  Medication Dose Route Frequency Provider Last Rate Last Admin   0.9 %  sodium chloride infusion  500 mL Intravenous Once Charlie Pitter III, MD       No results found.  Review of Systems:   A ROS was performed including pertinent positives and negatives as documented in the HPI.   Musculoskeletal Exam:    There were no vitals taken for this visit.  Incision is well-appearing without erythema or drainage.  She has some tenderness about the quadriceps and the vastus lateralis.  No tenderness about the lateral trochanter.  There is some weakness with resisted abduction.  She is walking with a mildly antalgic gait.  Remainder of distal neurosensory exam is intact  Imaging:      I personally reviewed and interpreted the radiographs.   Assessment:   Status post left hip gluteus medius repair with soreness of the left as well as the right.  We did have a discussion regarding the right hip at today's visit.  Overall given the fact that she is requiring a lumbar procedure I do ultimately believe that she would be best served doing this first.  I believe this would allow her to optimize any type of recovery following her right hip gluteus medius repair.  At this time she will plan to pursue that.  I will plan to see her back following Plan :    -Return to clinic following lumbar intervention   I personally saw and evaluated the patient, and participated in the management and treatment plan.  Huel Cote, MD Attending Physician, Orthopedic Surgery  This document was dictated using Dragon voice recognition software. A reasonable attempt at proof reading has been made to minimize errors.

## 2023-10-04 ENCOUNTER — Other Ambulatory Visit (HOSPITAL_COMMUNITY): Payer: Self-pay

## 2023-10-07 ENCOUNTER — Encounter (HOSPITAL_COMMUNITY): Payer: Medicare Other

## 2023-10-30 HISTORY — PX: OTHER SURGICAL HISTORY: SHX169

## 2023-11-07 ENCOUNTER — Telehealth: Payer: Self-pay | Admitting: Gastroenterology

## 2023-11-07 NOTE — Telephone Encounter (Signed)
 Returned call to patient. Pt has been advised that she titrate Miralax up tot 3 x a day as needed for constipation. Pt verbalized understanding and had no concerns at the end of the call.

## 2023-11-07 NOTE — Telephone Encounter (Signed)
 Requesting to speak with a nurse to inquire about taking Murelax twice a week.

## 2023-11-12 DIAGNOSIS — M4316 Spondylolisthesis, lumbar region: Secondary | ICD-10-CM | POA: Diagnosis not present

## 2023-11-12 DIAGNOSIS — Z6825 Body mass index (BMI) 25.0-25.9, adult: Secondary | ICD-10-CM | POA: Diagnosis not present

## 2023-11-15 ENCOUNTER — Encounter (HOSPITAL_BASED_OUTPATIENT_CLINIC_OR_DEPARTMENT_OTHER): Payer: Self-pay | Admitting: Orthopaedic Surgery

## 2023-11-22 ENCOUNTER — Ambulatory Visit (HOSPITAL_BASED_OUTPATIENT_CLINIC_OR_DEPARTMENT_OTHER): Payer: Medicare Other | Admitting: Orthopaedic Surgery

## 2023-11-22 DIAGNOSIS — S76012A Strain of muscle, fascia and tendon of left hip, initial encounter: Secondary | ICD-10-CM | POA: Diagnosis not present

## 2023-11-22 NOTE — Progress Notes (Signed)
Post Operative Evaluation    Procedure/Date of Surgery: Left gluteus medius repair 02/12/23  Interval History:   11/22/2023: Presents today for follow-up of her left hip.  At this point she is still experiencing radiating pain to the lateral aspect of the hip.  This is making it very difficult to rehab and recover.  She is pending possible lumbar revision surgery   PMH/PSH/Family History/Social History/Meds/Allergies:    Past Medical History:  Diagnosis Date   Arthritis    GERD (gastroesophageal reflux disease)    Heart murmur    Hyperlipidemia    Hypothyroidism    Internal hemorrhoid    Osteopenia    Schatzki's ring    Thyroid disease    Thyroid nodule    right 1.7 cm   Tubular adenoma    Past Surgical History:  Procedure Laterality Date   ABDOMINAL HYSTERECTOMY     BREAST SURGERY     for dilated duct   CATARACT EXTRACTION Bilateral    GLUTEUS MINIMUS REPAIR Left 02/12/2023   Procedure: LEFT GLUTEUS MEDIUS REPAIR WITH POSSIBLE COLLAGEN PATCH PLACEMENT;  Surgeon: Huel Cote, MD;  Location: [facility];  Service: Orthopedics;  Laterality: Left;   left thyroid lobectomy  2004   MYRINGOTOMY WITH TUBE PLACEMENT Left 12/19/2022   Procedure: MYRINGOTOMY WITH TUBE PLACEMENT;  Surgeon: Laren Boom, DO;  Location: [facility];  Service: ENT;  Laterality: Left;   Social History   Socioeconomic History   Marital status: Married    Spouse name: Not on file   Number of children: 2   Years of education: Not on file   Highest education level: Not on file  Occupational History   Not on file  Tobacco Use   Smoking status: Never   Smokeless tobacco: Never  Vaping Use   Vaping status: Never Used  Substance and Sexual Activity   Alcohol use: Yes    Alcohol/week: 7.0 standard drinks of alcohol    Types: 7 Glasses of wine per week    Comment: 1 glass a day typically   Drug use: No   Sexual activity: Not Currently     Partners: Male  Other Topics Concern   Not on file  Social History Narrative   Retired from [employer].   Moved to [facility] 07/2015.   From [place].   Social Drivers of Corporate investment banker Strain: Not on file  Food Insecurity: Not on file  Transportation Needs: Not on file  Physical Activity: Not on file  Stress: Not on file  Social Connections: Not on file   Family History  Problem Relation Age of Onset   Transient ischemic attack Mother    Arthritis Mother    Transient ischemic attack Father    Arthritis Father    Hyperlipidemia Father    Thyroid disease Sister    Diabetes Son    Asthma Son    Heart disease Paternal Uncle    Colon cancer Neg Hx    Esophageal cancer Neg Hx    Rectal cancer Neg Hx    Sleep apnea Neg Hx    Allergies  Allergen Reactions   Declomycin [Demeclocycline]    Current Outpatient Medications  Medication Sig Dispense Refill   alendronate (FOSAMAX) 70 MG tablet Take 70 mg by mouth once a week. Take with  a full glass of water on an empty stomach.     Ascorbic Acid (VITAMIN C) 1000 MG tablet Take 1,000 mg by mouth daily.     ASPIRIN 81 PO Take by mouth daily.     Calcium Carb-Cholecalciferol (CALCIUM 600 + D PO) Take by mouth.     Carboxymethylcellulose Sodium (REFRESH PLUS OP) Place 1 drop into both eyes daily. PF     ezetimibe (ZETIA) 10 MG tablet Take 10 mg by mouth daily.     famotidine (PEPCID) 40 MG tablet Take 1 tablet (40 mg total) by mouth at bedtime. 90 tablet 0   levothyroxine (SYNTHROID, LEVOTHROID) 100 MCG tablet Take 1 tablet (100 mcg total) by mouth every morning. 30 tablet 3   Multiple Vitamin (MULTIVITAMIN) capsule Take 1 capsule by mouth daily.     Multiple Vitamins-Minerals (PRESERVISION AREDS 2) CAPS Take 1 tablet by mouth 2 (two) times daily.     simvastatin (ZOCOR) 20 MG tablet Take 1 tablet (20 mg total) by mouth daily. 30 tablet 0   Current Facility-Administered Medications  Medication Dose Route Frequency Provider Last  Rate Last Admin   0.9 %  sodium chloride infusion  500 mL Intravenous Once Charlie Pitter III, MD       No results found.  Review of Systems:   A ROS was performed including pertinent positives and negatives as documented in the HPI.   Musculoskeletal Exam:    There were no vitals taken for this visit.  Incision is well-appearing without erythema or drainage.  She has some tenderness about the quadriceps and the vastus lateralis.  No tenderness about the lateral trochanter.  There is some weakness with resisted abduction.  She is walking with a mildly antalgic gait.  Remainder of distal neurosensory exam is intact  Imaging:      I personally reviewed and interpreted the radiographs.   Assessment:   Status post left hip gluteus medius repair with soreness of the left as well as the right.  We did have a discussion regarding the right hip as well as her left hip.  She is pending lumbar revision surgery at this time.  Overall I do believe that she may have some fatty atrophy of the gluteus muscle which may be limiting her ability rehab and recovery.  At this time I would like to obtain an MRI of the left hip to see if there has been Progression of this.  I will plan to send her for an MRI of the lumbar spine as well as she has this pending I would like to have both done at the same time. Plan :    -To clinic following MRI left hip   I personally saw and evaluated the patient, and participated in the management and treatment plan.  Huel Cote, MD Attending Physician, Orthopedic Surgery  This document was dictated using Dragon voice recognition software. A reasonable attempt at proof reading has been made to minimize errors.

## 2023-12-09 ENCOUNTER — Ambulatory Visit
Admission: RE | Admit: 2023-12-09 | Discharge: 2023-12-09 | Disposition: A | Discharge status: Home / Self Care | Payer: Medicare Other | Source: Ambulatory Visit | Attending: Orthopaedic Surgery | Admitting: Orthopaedic Surgery

## 2023-12-09 DIAGNOSIS — M1612 Unilateral primary osteoarthritis, left hip: Secondary | ICD-10-CM | POA: Diagnosis not present

## 2023-12-09 DIAGNOSIS — M48061 Spinal stenosis, lumbar region without neurogenic claudication: Secondary | ICD-10-CM | POA: Diagnosis not present

## 2023-12-09 DIAGNOSIS — M7602 Gluteal tendinitis, left hip: Secondary | ICD-10-CM | POA: Diagnosis not present

## 2023-12-09 DIAGNOSIS — S76012A Strain of muscle, fascia and tendon of left hip, initial encounter: Secondary | ICD-10-CM

## 2023-12-17 DIAGNOSIS — Z6825 Body mass index (BMI) 25.0-25.9, adult: Secondary | ICD-10-CM | POA: Diagnosis not present

## 2023-12-17 DIAGNOSIS — M4316 Spondylolisthesis, lumbar region: Secondary | ICD-10-CM | POA: Diagnosis not present

## 2023-12-18 ENCOUNTER — Ambulatory Visit (HOSPITAL_BASED_OUTPATIENT_CLINIC_OR_DEPARTMENT_OTHER): Payer: Medicare Other | Admitting: Orthopaedic Surgery

## 2023-12-18 ENCOUNTER — Ambulatory Visit (INDEPENDENT_AMBULATORY_CARE_PROVIDER_SITE_OTHER): Payer: Medicare Other | Admitting: Orthopaedic Surgery

## 2023-12-18 ENCOUNTER — Encounter (HOSPITAL_BASED_OUTPATIENT_CLINIC_OR_DEPARTMENT_OTHER): Payer: Self-pay | Admitting: Orthopaedic Surgery

## 2023-12-18 DIAGNOSIS — S76012A Strain of muscle, fascia and tendon of left hip, initial encounter: Secondary | ICD-10-CM

## 2023-12-18 NOTE — Progress Notes (Signed)
 Post Operative Evaluation    Procedure/Date of Surgery: Left gluteus medius repair 02/12/23  Interval History:   12/18/2023: Presents today for follow-up of her left hip.  She is here today for MRI discussion.   PMH/PSH/Family History/Social History/Meds/Allergies:    Past Medical History:  Diagnosis Date   Arthritis    GERD (gastroesophageal reflux disease)    Heart murmur    Hyperlipidemia    Hypothyroidism    Internal hemorrhoid    Osteopenia    Schatzki's ring    Thyroid disease    Thyroid nodule    right 1.7 cm   Tubular adenoma    Past Surgical History:  Procedure Laterality Date   ABDOMINAL HYSTERECTOMY     BREAST SURGERY     for dilated duct   CATARACT EXTRACTION Bilateral    GLUTEUS MINIMUS REPAIR Left 02/12/2023   Procedure: LEFT GLUTEUS MEDIUS REPAIR WITH POSSIBLE COLLAGEN PATCH PLACEMENT;  Surgeon: Huel Cote, MD;  Location: [facility];  Service: Orthopedics;  Laterality: Left;   left thyroid lobectomy  2004   MYRINGOTOMY WITH TUBE PLACEMENT Left 12/19/2022   Procedure: MYRINGOTOMY WITH TUBE PLACEMENT;  Surgeon: Laren Boom, DO;  Location: [facility];  Service: ENT;  Laterality: Left;   Social History   Socioeconomic History   Marital status: Married    Spouse name: Not on file   Number of children: 2   Years of education: Not on file   Highest education level: Not on file  Occupational History   Not on file  Tobacco Use   Smoking status: Never   Smokeless tobacco: Never  Vaping Use   Vaping status: Never Used  Substance and Sexual Activity   Alcohol use: Yes    Alcohol/week: 7.0 standard drinks of alcohol    Types: 7 Glasses of wine per week    Comment: 1 glass a day typically   Drug use: No   Sexual activity: Not Currently    Partners: Male  Other Topics Concern   Not on file  Social History Narrative   Retired from [employer].   Moved to [facility] 07/2015.   From [place].   Social  Drivers of Corporate investment banker Strain: Not on file  Food Insecurity: Not on file  Transportation Needs: Not on file  Physical Activity: Not on file  Stress: Not on file  Social Connections: Not on file   Family History  Problem Relation Age of Onset   Transient ischemic attack Mother    Arthritis Mother    Transient ischemic attack Father    Arthritis Father    Hyperlipidemia Father    Thyroid disease Sister    Diabetes Son    Asthma Son    Heart disease Paternal Uncle    Colon cancer Neg Hx    Esophageal cancer Neg Hx    Rectal cancer Neg Hx    Sleep apnea Neg Hx    Allergies  Allergen Reactions   Declomycin [Demeclocycline]    Current Outpatient Medications  Medication Sig Dispense Refill   alendronate (FOSAMAX) 70 MG tablet Take 70 mg by mouth once a week. Take with a full glass of water on an empty stomach.     Ascorbic Acid (VITAMIN C) 1000 MG tablet Take 1,000 mg by mouth daily.  ASPIRIN 81 PO Take by mouth daily.     Calcium Carb-Cholecalciferol (CALCIUM 600 + D PO) Take by mouth.     Carboxymethylcellulose Sodium (REFRESH PLUS OP) Place 1 drop into both eyes daily. PF     ezetimibe (ZETIA) 10 MG tablet Take 10 mg by mouth daily.     famotidine (PEPCID) 40 MG tablet Take 1 tablet (40 mg total) by mouth at bedtime. 90 tablet 0   levothyroxine (SYNTHROID, LEVOTHROID) 100 MCG tablet Take 1 tablet (100 mcg total) by mouth every morning. 30 tablet 3   Multiple Vitamin (MULTIVITAMIN) capsule Take 1 capsule by mouth daily.     Multiple Vitamins-Minerals (PRESERVISION AREDS 2) CAPS Take 1 tablet by mouth 2 (two) times daily.     simvastatin (ZOCOR) 20 MG tablet Take 1 tablet (20 mg total) by mouth daily. 30 tablet 0   Current Facility-Administered Medications  Medication Dose Route Frequency Provider Last Rate Last Admin   0.9 %  sodium chloride infusion  500 mL Intravenous Once Charlie Pitter III, MD       No results found.  Review of Systems:   A ROS was  performed including pertinent positives and negatives as documented in the HPI.   Musculoskeletal Exam:    There were no vitals taken for this visit.  Incision is well-appearing without erythema or drainage.  She has some tenderness about the quadriceps and the vastus lateralis.  No tenderness about the lateral trochanter.  There is some weakness with resisted abduction.  She is walking with a mildly antalgic gait.  Remainder of distal neurosensory exam is intact  Imaging:    MRI left hip: There does appear to be recurrent tear at the musculotendinous junction with significant T1 atrophy of the gluteus medius and minimus  I personally reviewed and interpreted the radiographs.   Assessment:   Status post left hip gluteus medius repair with soreness of the left as well as the right.  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  This is resulted in significant atrophy on T1.  Given this and her inability to stay active I have ultimately discussed the possibility of gluteus maximus tendon transfer.  She does have a pending revision lumbar surgery.  I did discuss that ideally we would proceed with this following her lumbar spine intervention.  I did discuss the risks and limitations.  She will further consider options Plan :    -Plan for possible left gluteus maximus tendon transfer   I personally saw and evaluated the patient, and participated in the management and treatment plan.  Huel Cote, MD Attending Physician, Orthopedic Surgery  This document was dictated using Dragon voice recognition software. A reasonable attempt at proof reading has been made to minimize errors.

## 2023-12-20 ENCOUNTER — Other Ambulatory Visit: Payer: Self-pay | Admitting: Neurological Surgery

## 2024-01-08 NOTE — Progress Notes (Signed)
 Surgical Instructions   Your procedure is scheduled on Friday January 17, 2024. Report to [facility] Main Entrance "A" at 11:15 A.M., then check in with the Admitting office. Any questions or running late day of surgery: call [phone]  Questions prior to your surgery date: call [phone], Monday-Friday, 8am-4pm. If you experience any cold or flu symptoms such as cough, fever, chills, shortness of breath, etc. between now and your scheduled surgery, please notify us at the above number.     Remember:  Do not eat or drink after midnight the night before your surgery   Take these medicines the morning of surgery with A SIP OF WATER  Carboxymethylcellulose Sodium (REFRESH PLUS OP)  ezetimibe (ZETIA)  levothyroxine (SYNTHROID, LEVOTHROID)  simvastatin (ZOCOR)   Follow your surgeon's instructions on when to stop Asprin.  If no instructions were given by your surgeon then you will need to call the office to get those instructions.     One week prior to surgery, STOP taking any Aleve, Naproxen, Ibuprofen, Motrin, Advil, Goody's, BC's, all herbal medications, fish oil, and non-prescription vitamins.                     Do NOT Smoke (Tobacco/Vaping) for 24 hours prior to your procedure.  If you use a CPAP at night, you may bring your mask/headgear for your overnight stay.   You will be asked to remove any contacts, glasses, piercing's, hearing aid's, dentures/partials prior to surgery. Please bring cases for these items if needed.    Patients discharged the day of surgery will not be allowed to drive home, and someone needs to stay with them for 24 hours.  SURGICAL WAITING ROOM VISITATION Patients may have no more than 2 support people in the waiting area - these visitors may rotate.   Pre-op nurse will coordinate an appropriate time for 1 ADULT support person, who may not rotate, to accompany patient in pre-op.  Children under the age of 36 must have an adult with them who is not the  patient and must remain in the main waiting area with an adult.  If the patient needs to stay at the hospital during part of their recovery, the visitor guidelines for inpatient rooms apply.  Please refer to the [facility] website for the visitor guidelines for any additional information.   If you received a COVID test during your pre-op visit  it is requested that you wear a mask when out in public, stay away from anyone that may not be feeling well and notify your surgeon if you develop symptoms. If you have been in contact with anyone that has tested positive in the last 10 days please notify you surgeon.      Pre-operative 5 CHG Bathing Instructions   You can play a key role in reducing the risk of infection after surgery. Your skin needs to be as free of germs as possible. You can reduce the number of germs on your skin by washing with CHG (chlorhexidine gluconate) soap before surgery. CHG is an antiseptic soap that kills germs and continues to kill germs even after washing.   DO NOT use if you have an allergy to chlorhexidine/CHG or antibacterial soaps. If your skin becomes reddened or irritated, stop using the CHG and notify one of our RNs at [phone].   Please shower with the CHG soap starting 4 days before surgery using the following schedule:     Please keep in mind the following:  DO NOT shave, including legs and underarms, starting the day of your first shower.   You may shave your face at any point before/day of surgery.  Place clean sheets on your bed the day you start using CHG soap. Use a clean washcloth (not used since being washed) for each shower. DO NOT sleep with pets once you start using the CHG.   CHG Shower Instructions:  Wash your face and private area with normal soap. If you choose to wash your hair, wash first with your normal shampoo.  After you use shampoo/soap, rinse your hair and body thoroughly to remove shampoo/soap residue.  Turn the water OFF  and apply about 3 tablespoons (45 ml) of CHG soap to a CLEAN washcloth.  Apply CHG soap ONLY FROM YOUR NECK DOWN TO YOUR TOES (washing for 3-5 minutes)  DO NOT use CHG soap on face, private areas, open wounds, or sores.  Pay special attention to the area where your surgery is being performed.  If you are having back surgery, having someone wash your back for you may be helpful. Wait 2 minutes after CHG soap is applied, then you may rinse off the CHG soap.  Pat dry with a clean towel  Put on clean clothes/pajamas   If you choose to wear lotion, please use ONLY the CHG-compatible lotions that are listed below.  Additional instructions for the day of surgery: DO NOT APPLY any lotions, deodorants or perfumes.   Do not bring valuables to the hospital. [facility] is not responsible for any belongings/valuables. Do not wear nail polish, gel polish, artificial nails, or any other type of covering on natural nails (fingers and toes) Do not wear jewelry or makeup Put on clean/comfortable clothes.  Please brush your teeth.  Ask your nurse before applying any prescription medications to the skin.     CHG Compatible Lotions   Aveeno Moisturizing lotion  Cetaphil Moisturizing Cream  Cetaphil Moisturizing Lotion  Clairol Herbal Essence Moisturizing Lotion, Dry Skin  Clairol Herbal Essence Moisturizing Lotion, Extra Dry Skin  Clairol Herbal Essence Moisturizing Lotion, Normal Skin  Curel Age Defying Therapeutic Moisturizing Lotion with Alpha Hydroxy  Curel Extreme Care Body Lotion  Curel Soothing Hands Moisturizing Hand Lotion  Curel Therapeutic Moisturizing Cream, Fragrance-Free  Curel Therapeutic Moisturizing Lotion, Fragrance-Free  Curel Therapeutic Moisturizing Lotion, Original Formula  Eucerin Daily Replenishing Lotion  Eucerin Dry Skin Therapy Plus Alpha Hydroxy Crme  Eucerin Dry Skin Therapy Plus Alpha Hydroxy Lotion  Eucerin Original Crme  Eucerin Original Lotion  Eucerin Plus  Crme Eucerin Plus Lotion  Eucerin TriLipid Replenishing Lotion  Keri Anti-Bacterial Hand Lotion  Keri Deep Conditioning Original Lotion Dry Skin Formula Softly Scented  Keri Deep Conditioning Original Lotion, Fragrance Free Sensitive Skin Formula  Keri Lotion Fast Absorbing Fragrance Free Sensitive Skin Formula  Keri Lotion Fast Absorbing Softly Scented Dry Skin Formula  Keri Original Lotion  Keri Skin Renewal Lotion Keri Silky Smooth Lotion  Keri Silky Smooth Sensitive Skin Lotion  Nivea Body Creamy Conditioning Oil  Nivea Body Extra Enriched Lotion  Nivea Body Original Lotion  Nivea Body Sheer Moisturizing Lotion Nivea Crme  Nivea Skin Firming Lotion  NutraDerm 30 Skin Lotion  NutraDerm Skin Lotion  NutraDerm Therapeutic Skin Cream  NutraDerm Therapeutic Skin Lotion  ProShield Protective Hand Cream  Provon moisturizing lotion  Please read over the following fact sheets that you were given.

## 2024-01-09 ENCOUNTER — Encounter (HOSPITAL_COMMUNITY): Payer: Self-pay

## 2024-01-09 ENCOUNTER — Other Ambulatory Visit: Payer: Self-pay

## 2024-01-09 ENCOUNTER — Encounter (HOSPITAL_COMMUNITY)
Admission: RE | Admit: 2024-01-09 | Discharge: 2024-01-09 | Disposition: A | Discharge status: Home / Self Care | Source: Ambulatory Visit | Attending: Neurological Surgery | Admitting: Neurological Surgery

## 2024-01-09 VITALS — BP 127/70 | HR 77 | Temp 97.8°F | Resp 17 | Ht 65.0 in | Wt 155.8 lb

## 2024-01-09 DIAGNOSIS — Z01818 Encounter for other preprocedural examination: Secondary | ICD-10-CM

## 2024-01-09 DIAGNOSIS — M1612 Unilateral primary osteoarthritis, left hip: Secondary | ICD-10-CM | POA: Insufficient documentation

## 2024-01-09 DIAGNOSIS — E785 Hyperlipidemia, unspecified: Secondary | ICD-10-CM | POA: Diagnosis not present

## 2024-01-09 DIAGNOSIS — M48061 Spinal stenosis, lumbar region without neurogenic claudication: Secondary | ICD-10-CM | POA: Insufficient documentation

## 2024-01-09 DIAGNOSIS — E890 Postprocedural hypothyroidism: Secondary | ICD-10-CM | POA: Diagnosis not present

## 2024-01-09 DIAGNOSIS — I2510 Atherosclerotic heart disease of native coronary artery without angina pectoris: Secondary | ICD-10-CM | POA: Diagnosis not present

## 2024-01-09 DIAGNOSIS — G4733 Obstructive sleep apnea (adult) (pediatric): Secondary | ICD-10-CM | POA: Insufficient documentation

## 2024-01-09 DIAGNOSIS — M4316 Spondylolisthesis, lumbar region: Secondary | ICD-10-CM | POA: Diagnosis not present

## 2024-01-09 DIAGNOSIS — Z01812 Encounter for preprocedural laboratory examination: Secondary | ICD-10-CM | POA: Insufficient documentation

## 2024-01-09 HISTORY — DX: Sleep apnea, unspecified: G47.30

## 2024-01-09 LAB — CBC
HCT: 41.8 % (ref 36.0–46.0)
Hemoglobin: 13.4 g/dL (ref 12.0–15.0)
MCH: 29.4 pg (ref 26.0–34.0)
MCHC: 32.1 g/dL (ref 30.0–36.0)
MCV: 91.7 fL (ref 80.0–100.0)
Platelets: 406 10*3/uL — ABNORMAL HIGH (ref 150–400)
RBC: 4.56 MIL/uL (ref 3.87–5.11)
RDW: 13.3 % (ref 11.5–15.5)
WBC: 8.9 10*3/uL (ref 4.0–10.5)
nRBC: 0 % (ref 0.0–0.2)

## 2024-01-09 LAB — PROTIME-INR
INR: 1 (ref 0.8–1.2)
Prothrombin Time: 13.4 s (ref 11.4–15.2)

## 2024-01-09 LAB — TYPE AND SCREEN
ABO/RH(D): O POS
Antibody Screen: NEGATIVE

## 2024-01-09 LAB — SURGICAL PCR SCREEN
MRSA, PCR: NEGATIVE
Staphylococcus aureus: POSITIVE — AB

## 2024-01-09 NOTE — Progress Notes (Signed)
 PCP - Dr. Adrian Prince Cardiologist - Denies  PPM/ICD - Denies Device Orders - N/A Rep Notified - N/A  Chest x-ray - N/A EKG - N/A Stress Test - Denies ECHO - Denies Cardiac Cath - Denies  Sleep Study - Recently diagnoses on 07/23/23.  CPAP - Has not signed up for one yet.  Plans on moving to [place], [place] and will set up there.  NON-Diabetic  Last dose of GLP1 agonist-  Denies GLP1 instructions: N/A  Blood Thinner Instructions: Denies Aspirin Instructions: N/A  ERAS Protcol - NPO PRE-SURGERY Ensure or G2- none  COVID TEST- No   Anesthesia review: Yes, hx of heart murmur.  Has never seen cardiologist for it, managed by primary.    Patient denies shortness of breath, fever, cough and chest pain at PAT appointment. Denies any respiratory issues at this time.    All instructions explained to the patient, with a verbal understanding of the material. Patient agrees to go over the instructions while at home for a better understanding. Patient also instructed to self quarantine after being tested for COVID-19. The opportunity to ask questions was provided.

## 2024-01-10 NOTE — Progress Notes (Signed)
 Anesthesia Chart Review:  Case: [id number] Date/Time: 01/17/24 1303   Procedure: PLIF - L3-L4 - L4-L5 - Posterior Lateral and Interbody fusion (Back)   Anesthesia type: General   Pre-op diagnosis: Spondylolisthesis   [facility] RO[facility] / [facility]   Surgeons: Arman Bogus, MD       DISCUSSION: Patient is a 78 year old female scheduled for the above procedure.   History includes never smoker, HLD, hypothyroidism, OSA (moderate OSA 08/2023 HST; does not have CPAP yet), GERD, arthritis, hysterectomy (~ 1996), left thyroidectomy (~ 2004), murmur (MVP is listed notes dating back to 2011, although denied prior echo; "no murmurs" documented in 08/28/22 note by PCP Dr. Evlyn Kanner), left myringotomy tube (12/19/22), left hip gluteus medius repair & trochanteric bursectomy (02/12/23).    Chart forwarded for Anesthesia APP review after patient had left her PAT visit. She had 2 procedures under anesthesia at [facility] in 2024. She moved from [place] to [place] around 2017, a few old primary care notes are scanned under Media tab. Her current PCP is [place], Jeannett Senior, MD with [facility]). Apparently, she is planning to relocate to [facility] in the near future.  She denied chest pain, SOB, cough, fever at PAT RN visit. Anesthesia team to evaluate on the day of surgery.    VS: BP 127/70   Pulse 77   Temp 36.6 C   Resp 17   Ht 5\' 5"  (1.651 m)   Wt 70.7 kg   SpO2 100%   BMI 25.93 kg/m   PROVIDERS: Adrian Prince, MD is PCP  Huston Foley, MD is neurologist (OSA)   LABS: Labs reviewed: Acceptable for surgery. (all labs ordered are listed, but only abnormal results are displayed)  Labs Reviewed  SURGICAL PCR SCREEN - Abnormal; Notable for the following components:      Result Value   Staphylococcus aureus POSITIVE (*)    All other components within normal limits  CBC - Abnormal; Notable for the following components:   Platelets 406 (*)    All other  components within normal limits  PROTIME-INR  TYPE AND SCREEN    Home Sleep Study 09/04/23: IMPRESSION: OSA (obstructive sleep apnea)  RECOMMENDATION:  This home sleep test demonstrates moderate obstructive sleep apnea with a total AHI of 19.5/hour and O2 nadir of 88%.  Variable snoring was detected, ranging from mild to louder, at times mild and intermittent. Treatment with a positive airway pressure (PAP) device is recommended. The patient will be advised to proceed with an autoPAP titration/trial at home for now....   IMAGES: MRI L-spine 12/09/23: IMPRESSION: 1. Unchanged severe right L5-S1 neural foraminal stenosis. 2. Unchanged narrowing of both lateral recesses at L3-4 and L4-5, which could affect the descending L4 and L5 nerve roots, respectively. 3. Unchanged mild right L4-5 neural foraminal stenosis.   MRI Left Hip 12/09/23: IMPRESSION: 1. No hip fracture, dislocation or avascular necrosis. 2. Mild-moderate osteoarthritis of the left hip. 3. Moderate tendinosis of the left gluteus medius tendon. 4. Partial-thickness tear of the right gluteus medius and minimus tendons.    EKG: EKG 05/24/17: Per Narrative in [facility] Everywhere: Sinus rhythm.  Nonspecific ST-T changes.    CV: N/A. (Of note, [facility] PMHx notes mention a 2019 stress test followed by a cardiac cath on 07/29/18 showing "borderline angiographic lesion on the medial segment of the LAD which was not significant by FFR. otherwise minimal irregularities with nonocclusive CAD" and a normal stress test on 7/7/212021.These are reportedly  erroneous entries. There are no result reports in [facility] or Care Everywhere indicating she ever had these tests either.)   Past Medical History:  Diagnosis Date   Arthritis    GERD (gastroesophageal reflux disease)    Heart murmur    Hyperlipidemia    Hypothyroidism    Internal hemorrhoid    Osteopenia    Schatzki's ring    Sleep apnea    does not use a cpap machine    Thyroid disease    Thyroid nodule    right 1.7 cm   Tubular adenoma     Past Surgical History:  Procedure Laterality Date   ABDOMINAL HYSTERECTOMY     BREAST SURGERY     for dilated duct   CATARACT EXTRACTION Bilateral    GLUTEUS MINIMUS REPAIR Left 02/12/2023   Procedure: LEFT GLUTEUS MEDIUS REPAIR WITH POSSIBLE COLLAGEN PATCH PLACEMENT;  Surgeon: Huel Cote, MD;  Location:  SURGERY CENTER;  Service: Orthopedics;  Laterality: Left;   left thyroid lobectomy  2004   MYRINGOTOMY WITH TUBE PLACEMENT Left 12/19/2022   Procedure: MYRINGOTOMY WITH TUBE PLACEMENT;  Surgeon: Cheron Schaumann A, DO;  Location: [facility];  Service: ENT;  Laterality: Left;    MEDICATIONS:  alendronate (FOSAMAX) 70 MG tablet   Ascorbic Acid (VITAMIN C) 1000 MG tablet   ASPIRIN 81 PO   Calcium Carb-Cholecalciferol (CALCIUM 600 + D PO)   Carboxymethylcellulose Sodium (REFRESH PLUS OP)   ezetimibe (ZETIA) 10 MG tablet   famotidine (PEPCID) 40 MG tablet   levothyroxine (SYNTHROID, LEVOTHROID) 100 MCG tablet   Multiple Vitamin (MULTIVITAMIN) capsule   Multiple Vitamins-Minerals (PRESERVISION AREDS 2) CAPS   Phenyleph-Doxylamine-DM-APAP (NYQUIL SEVERE COLD/FLU) 5-6.25-10-325 MG/15ML LIQD   simvastatin (ZOCOR) 20 MG tablet    0.9 %  sodium chloride infusion  Advised to follow surgeon recommendations regarding perioperative ASA instructions.    Shonna Chock, PA-C Surgical Short Stay/Anesthesiology [facility] Phone [phone] [facility] Phone [phone] 01/10/2024 3:46 PM

## 2024-01-10 NOTE — Anesthesia Preprocedure Evaluation (Addendum)
 Anesthesia Evaluation  Patient identified by MRN, date of birth, ID band Patient awake    Reviewed: Allergy & Precautions, NPO status , Patient's Chart, lab work & pertinent test results  Airway Mallampati: II  TM Distance: >3 FB Neck ROM: Full    Dental no notable dental hx. (+) Dental Advisory Given   Pulmonary sleep apnea (does not use CPAP)    Pulmonary exam normal breath sounds clear to auscultation       Cardiovascular negative cardio ROS Normal cardiovascular exam Rhythm:Regular Rate:Normal     Neuro/Psych negative neurological ROS  negative psych ROS   GI/Hepatic Neg liver ROS,GERD  ,,  Endo/Other  Hypothyroidism    Renal/GU negative Renal ROS  negative genitourinary   Musculoskeletal  (+) Arthritis ,    Abdominal   Peds  Hematology negative hematology ROS (+)   Anesthesia Other Findings   Reproductive/Obstetrics                             Anesthesia Physical Anesthesia Plan  ASA: 2  Anesthesia Plan: General   Post-op Pain Management: Tylenol PO (pre-op)*   Induction: Intravenous  PONV Risk Score and Plan: 3 and Dexamethasone, Ondansetron and Treatment may vary due to age or medical condition  Airway Management Planned: Oral ETT  Additional Equipment:   Intra-op Plan:   Post-operative Plan: Extubation in OR  Informed Consent: I have reviewed the patients History and Physical, chart, labs and discussed the procedure including the risks, benefits and alternatives for the proposed anesthesia with the patient or authorized representative who has indicated his/her understanding and acceptance.     Dental advisory given  Plan Discussed with: CRNA  Anesthesia Plan Comments: (PAT note written 01/10/2024 by Shonna Chock, PA-C.  )       Anesthesia Quick Evaluation

## 2024-01-17 ENCOUNTER — Observation Stay (HOSPITAL_COMMUNITY)
Admission: RE | Admit: 2024-01-17 | Discharge: 2024-01-18 | Disposition: A | Discharge status: Home / Self Care | Payer: Medicare Other | Attending: Neurological Surgery | Admitting: Neurological Surgery

## 2024-01-17 ENCOUNTER — Other Ambulatory Visit: Payer: Self-pay

## 2024-01-17 ENCOUNTER — Inpatient Hospital Stay (HOSPITAL_BASED_OUTPATIENT_CLINIC_OR_DEPARTMENT_OTHER): Payer: Self-pay

## 2024-01-17 ENCOUNTER — Inpatient Hospital Stay (HOSPITAL_COMMUNITY)

## 2024-01-17 ENCOUNTER — Ambulatory Visit (HOSPITAL_COMMUNITY)
Admission: RE | Disposition: A | Discharge status: Home / Self Care | Payer: Self-pay | Source: Home / Self Care | Attending: Neurological Surgery

## 2024-01-17 ENCOUNTER — Encounter (HOSPITAL_COMMUNITY): Payer: Self-pay | Admitting: Neurological Surgery

## 2024-01-17 ENCOUNTER — Inpatient Hospital Stay (HOSPITAL_COMMUNITY): Payer: Self-pay | Admitting: Physician Assistant

## 2024-01-17 DIAGNOSIS — Z7982 Long term (current) use of aspirin: Secondary | ICD-10-CM | POA: Insufficient documentation

## 2024-01-17 DIAGNOSIS — Z79899 Other long term (current) drug therapy: Secondary | ICD-10-CM | POA: Diagnosis not present

## 2024-01-17 DIAGNOSIS — M5416 Radiculopathy, lumbar region: Secondary | ICD-10-CM | POA: Insufficient documentation

## 2024-01-17 DIAGNOSIS — M48061 Spinal stenosis, lumbar region without neurogenic claudication: Secondary | ICD-10-CM

## 2024-01-17 DIAGNOSIS — M4316 Spondylolisthesis, lumbar region: Secondary | ICD-10-CM

## 2024-01-17 DIAGNOSIS — E039 Hypothyroidism, unspecified: Secondary | ICD-10-CM | POA: Diagnosis not present

## 2024-01-17 DIAGNOSIS — M5116 Intervertebral disc disorders with radiculopathy, lumbar region: Secondary | ICD-10-CM | POA: Diagnosis not present

## 2024-01-17 DIAGNOSIS — Z981 Arthrodesis status: Principal | ICD-10-CM

## 2024-01-17 LAB — ABO/RH: ABO/RH(D): O POS

## 2024-01-17 SURGERY — POSTERIOR LUMBAR FUSION 2 LEVEL
Anesthesia: General | Site: Back

## 2024-01-17 MED ORDER — ADULT MULTIVITAMIN W/MINERALS CH
1.0000 | ORAL_TABLET | Freq: Every day | ORAL | Status: DC
  Filled 2024-01-17: qty 1

## 2024-01-17 MED ORDER — LIDOCAINE 2% (20 MG/ML) 5 ML SYRINGE
INTRAMUSCULAR | Status: AC
  Filled 2024-01-17: qty 5

## 2024-01-17 MED ORDER — THROMBIN 5000 UNITS EX KIT
PACK | CUTANEOUS | Status: AC
  Filled 2024-01-17: qty 1

## 2024-01-17 MED ORDER — BUPIVACAINE HCL (PF) 0.25 % IJ SOLN
INTRAMUSCULAR | Status: DC | PRN
  Administered 2024-01-17: 8 mL

## 2024-01-17 MED ORDER — LIDOCAINE 2% (20 MG/ML) 5 ML SYRINGE
INTRAMUSCULAR | Status: DC | PRN
  Administered 2024-01-17 (×4): 10 mg via INTRAVENOUS
  Administered 2024-01-17: 60 mg via INTRAVENOUS

## 2024-01-17 MED ORDER — VITAMIN C 500 MG PO TABS
2000.0000 mg | ORAL_TABLET | Freq: Every day | ORAL | Status: DC
  Filled 2024-01-17: qty 4

## 2024-01-17 MED ORDER — METHOCARBAMOL 1000 MG/10ML IJ SOLN: 500.0000 mg | Freq: Four times a day (QID) | INTRAMUSCULAR | Status: DC | PRN

## 2024-01-17 MED ORDER — HYDROMORPHONE HCL 1 MG/ML IJ SOLN
INTRAMUSCULAR | Status: AC
  Filled 2024-01-17: qty 1

## 2024-01-17 MED ORDER — CEFAZOLIN SODIUM-DEXTROSE 2-4 GM/100ML-% IV SOLN
2.0000 g | INTRAVENOUS | Status: AC
  Administered 2024-01-17: 2 g via INTRAVENOUS

## 2024-01-17 MED ORDER — THROMBIN 20000 UNITS EX SOLR
CUTANEOUS | Status: DC | PRN
  Administered 2024-01-17: 20 mL via TOPICAL

## 2024-01-17 MED ORDER — SODIUM CHLORIDE 0.9% FLUSH: 3.0000 mL | INTRAVENOUS | Status: DC | PRN

## 2024-01-17 MED ORDER — LEVOTHYROXINE SODIUM 100 MCG PO TABS
100.0000 ug | ORAL_TABLET | ORAL | Status: DC
  Administered 2024-01-18: 100 ug via ORAL
  Filled 2024-01-17: qty 1

## 2024-01-17 MED ORDER — 0.9 % SODIUM CHLORIDE (POUR BTL) OPTIME
TOPICAL | Status: DC | PRN
  Administered 2024-01-17: 1000 mL

## 2024-01-17 MED ORDER — ORAL CARE MOUTH RINSE: 15.0000 mL | Freq: Once | OROMUCOSAL | Status: AC

## 2024-01-17 MED ORDER — CHLORHEXIDINE GLUCONATE 0.12 % MT SOLN: 15.0000 mL | Freq: Once | OROMUCOSAL | Status: AC

## 2024-01-17 MED ORDER — SODIUM CHLORIDE 0.9% FLUSH
3.0000 mL | Freq: Two times a day (BID) | INTRAVENOUS | Status: DC
Start: 2024-01-17 — End: 2024-01-18
  Administered 2024-01-17: 3 mL via INTRAVENOUS

## 2024-01-17 MED ORDER — CELECOXIB 200 MG PO CAPS
200.0000 mg | ORAL_CAPSULE | Freq: Two times a day (BID) | ORAL | Status: DC
  Administered 2024-01-17: 200 mg via ORAL
  Filled 2024-01-17 (×2): qty 1

## 2024-01-17 MED ORDER — DEXAMETHASONE SODIUM PHOSPHATE 10 MG/ML IJ SOLN
INTRAMUSCULAR | Status: DC | PRN
  Administered 2024-01-17: 10 mg via INTRAVENOUS

## 2024-01-17 MED ORDER — BUPIVACAINE HCL (PF) 0.25 % IJ SOLN
INTRAMUSCULAR | Status: AC
  Filled 2024-01-17: qty 30

## 2024-01-17 MED ORDER — METHOCARBAMOL 1000 MG/10ML IJ SOLN
INTRAMUSCULAR | Status: DC | PRN
  Administered 2024-01-17 (×2): 100 mg via INTRAVENOUS
  Administered 2024-01-17: 50 mg via INTRAVENOUS

## 2024-01-17 MED ORDER — CHLORHEXIDINE GLUCONATE 0.12 % MT SOLN
OROMUCOSAL | Status: AC
  Administered 2024-01-17: 15 mL via OROMUCOSAL
  Filled 2024-01-17: qty 15

## 2024-01-17 MED ORDER — SODIUM CHLORIDE 0.9% FLUSH
3.0000 mL | Freq: Two times a day (BID) | INTRAVENOUS | Status: DC
  Administered 2024-01-17: 3 mL via INTRAVENOUS

## 2024-01-17 MED ORDER — FAMOTIDINE 20 MG PO TABS: 40.0000 mg | ORAL_TABLET | Freq: Every day | ORAL | Status: DC

## 2024-01-17 MED ORDER — PHENOL 1.4 % MT LIQD: 1.0000 | OROMUCOSAL | Status: DC | PRN

## 2024-01-17 MED ORDER — THROMBIN 5000 UNITS EX SOLR
OROMUCOSAL | Status: DC | PRN
  Administered 2024-01-17 (×2): 5 mL via TOPICAL

## 2024-01-17 MED ORDER — CHLORHEXIDINE GLUCONATE CLOTH 2 % EX PADS: 6.0000 | MEDICATED_PAD | Freq: Once | CUTANEOUS | Status: DC

## 2024-01-17 MED ORDER — DEXAMETHASONE SODIUM PHOSPHATE 4 MG/ML IJ SOLN
4.0000 mg | Freq: Four times a day (QID) | INTRAMUSCULAR | Status: DC
Start: 2024-01-17 — End: 2024-01-18

## 2024-01-17 MED ORDER — ONDANSETRON HCL 4 MG/2ML IJ SOLN: 4.0000 mg | Freq: Four times a day (QID) | INTRAMUSCULAR | Status: DC | PRN

## 2024-01-17 MED ORDER — SUGAMMADEX SODIUM 200 MG/2ML IV SOLN
INTRAVENOUS | Status: AC
  Filled 2024-01-17: qty 2

## 2024-01-17 MED ORDER — ACETAMINOPHEN 500 MG PO TABS: 1000.0000 mg | ORAL_TABLET | ORAL | Status: AC

## 2024-01-17 MED ORDER — HYDROMORPHONE HCL 1 MG/ML IJ SOLN
0.2500 mg | INTRAMUSCULAR | Status: DC | PRN
Start: 2024-01-17 — End: 2024-01-17
  Administered 2024-01-17 (×2): 0.5 mg via INTRAVENOUS
  Administered 2024-01-17: 0.25 mg via INTRAVENOUS
  Administered 2024-01-17: 0.5 mg via INTRAVENOUS
  Administered 2024-01-17: 0.25 mg via INTRAVENOUS

## 2024-01-17 MED ORDER — SODIUM CHLORIDE 0.9 % IV SOLN: 250.0000 mL | INTRAVENOUS | Status: DC

## 2024-01-17 MED ORDER — DEXAMETHASONE SODIUM PHOSPHATE 10 MG/ML IJ SOLN
INTRAMUSCULAR | Status: AC
  Filled 2024-01-17: qty 1

## 2024-01-17 MED ORDER — ROCURONIUM BROMIDE 10 MG/ML (PF) SYRINGE
PREFILLED_SYRINGE | INTRAVENOUS | Status: AC
  Filled 2024-01-17: qty 10

## 2024-01-17 MED ORDER — GABAPENTIN 300 MG PO CAPS
ORAL_CAPSULE | ORAL | Status: AC
  Administered 2024-01-17: 300 mg via ORAL
  Filled 2024-01-17: qty 1

## 2024-01-17 MED ORDER — ONDANSETRON HCL 4 MG/2ML IJ SOLN
INTRAMUSCULAR | Status: AC
  Filled 2024-01-17: qty 2

## 2024-01-17 MED ORDER — GABAPENTIN 300 MG PO CAPS: 300.0000 mg | ORAL_CAPSULE | ORAL | Status: AC

## 2024-01-17 MED ORDER — OXYCODONE HCL 5 MG PO TABS
10.0000 mg | ORAL_TABLET | ORAL | Status: DC | PRN
  Administered 2024-01-17 – 2024-01-18 (×4): 10 mg via ORAL
  Filled 2024-01-17 (×4): qty 2

## 2024-01-17 MED ORDER — ASPIRIN 81 MG PO CHEW
81.0000 mg | CHEWABLE_TABLET | Freq: Every day | ORAL | Status: DC
  Filled 2024-01-17: qty 1

## 2024-01-17 MED ORDER — THROMBIN 20000 UNITS EX SOLR
CUTANEOUS | Status: AC
  Filled 2024-01-17: qty 20000

## 2024-01-17 MED ORDER — MUPIROCIN 2 % EX OINT: 1.0000 | TOPICAL_OINTMENT | Freq: Two times a day (BID) | CUTANEOUS | 0 refills | Status: AC

## 2024-01-17 MED ORDER — MORPHINE SULFATE (PF) 2 MG/ML IV SOLN: 2.0000 mg | INTRAVENOUS | Status: DC | PRN

## 2024-01-17 MED ORDER — EZETIMIBE 10 MG PO TABS
10.0000 mg | ORAL_TABLET | Freq: Every day | ORAL | Status: DC
  Filled 2024-01-17: qty 1

## 2024-01-17 MED ORDER — CEFAZOLIN SODIUM-DEXTROSE 2-4 GM/100ML-% IV SOLN
INTRAVENOUS | Status: AC
  Filled 2024-01-17: qty 100

## 2024-01-17 MED ORDER — CEFAZOLIN SODIUM-DEXTROSE 2-4 GM/100ML-% IV SOLN
2.0000 g | Freq: Three times a day (TID) | INTRAVENOUS | Status: AC
  Administered 2024-01-17 – 2024-01-18 (×2): 2 g via INTRAVENOUS
  Filled 2024-01-17 (×2): qty 100

## 2024-01-17 MED ORDER — FENTANYL CITRATE (PF) 250 MCG/5ML IJ SOLN
INTRAMUSCULAR | Status: AC
  Filled 2024-01-17: qty 5

## 2024-01-17 MED ORDER — LACTATED RINGERS IV SOLN: INTRAVENOUS | Status: DC

## 2024-01-17 MED ORDER — CHLORHEXIDINE GLUCONATE CLOTH 2 % EX PADS
6.0000 | MEDICATED_PAD | Freq: Once | CUTANEOUS | Status: DC
Start: 2024-01-17 — End: 2024-01-17

## 2024-01-17 MED ORDER — PROPOFOL 10 MG/ML IV BOLUS
INTRAVENOUS | Status: DC | PRN
  Administered 2024-01-17: 20 mg via INTRAVENOUS
  Administered 2024-01-17: 120 mg via INTRAVENOUS

## 2024-01-17 MED ORDER — SENNA 8.6 MG PO TABS
1.0000 | ORAL_TABLET | Freq: Two times a day (BID) | ORAL | Status: DC
  Administered 2024-01-17: 8.6 mg via ORAL
  Filled 2024-01-17 (×2): qty 1

## 2024-01-17 MED ORDER — METHOCARBAMOL 500 MG PO TABS
500.0000 mg | ORAL_TABLET | Freq: Four times a day (QID) | ORAL | Status: DC | PRN
  Administered 2024-01-17 – 2024-01-18 (×2): 500 mg via ORAL
  Filled 2024-01-17 (×2): qty 1

## 2024-01-17 MED ORDER — MENTHOL 3 MG MT LOZG: 1.0000 | LOZENGE | OROMUCOSAL | Status: DC | PRN

## 2024-01-17 MED ORDER — VASHE WOUND IRRIGATION OPTIME
TOPICAL | Status: DC | PRN
  Administered 2024-01-17: 34 [oz_av] via TOPICAL

## 2024-01-17 MED ORDER — CHLORHEXIDINE GLUCONATE 4 % EX SOLN
1.0000 | CUTANEOUS | 1 refills | Status: DC
Start: 2024-01-17 — End: 2024-02-25

## 2024-01-17 MED ORDER — METHOCARBAMOL 1000 MG/10ML IJ SOLN
INTRAMUSCULAR | Status: AC
  Filled 2024-01-17: qty 10

## 2024-01-17 MED ORDER — ACETAMINOPHEN 500 MG PO TABS
ORAL_TABLET | ORAL | Status: AC
Start: 2024-01-17 — End: 2024-01-17
  Administered 2024-01-17: 1000 mg via ORAL
  Filled 2024-01-17: qty 2

## 2024-01-17 MED ORDER — ROCURONIUM BROMIDE 10 MG/ML (PF) SYRINGE
PREFILLED_SYRINGE | INTRAVENOUS | Status: DC | PRN
  Administered 2024-01-17: 50 mg via INTRAVENOUS
  Administered 2024-01-17: 20 mg via INTRAVENOUS
  Administered 2024-01-17 (×3): 10 mg via INTRAVENOUS

## 2024-01-17 MED ORDER — ONDANSETRON HCL 4 MG PO TABS: 4.0000 mg | ORAL_TABLET | Freq: Four times a day (QID) | ORAL | Status: DC | PRN

## 2024-01-17 MED ORDER — SUGAMMADEX SODIUM 200 MG/2ML IV SOLN
INTRAVENOUS | Status: DC | PRN
  Administered 2024-01-17 (×2): 50 mg via INTRAVENOUS
  Administered 2024-01-17: 100 mg via INTRAVENOUS

## 2024-01-17 MED ORDER — PROPOFOL 10 MG/ML IV BOLUS
INTRAVENOUS | Status: AC
  Filled 2024-01-17: qty 20

## 2024-01-17 MED ORDER — PHENYLEPHRINE 80 MCG/ML (10ML) SYRINGE FOR IV PUSH (FOR BLOOD PRESSURE SUPPORT)
PREFILLED_SYRINGE | INTRAVENOUS | Status: DC | PRN
  Administered 2024-01-17: 80 ug via INTRAVENOUS
  Administered 2024-01-17: 160 ug via INTRAVENOUS

## 2024-01-17 MED ORDER — METHOCARBAMOL 1000 MG/10ML IJ SOLN: INTRAMUSCULAR | Status: DC | PRN

## 2024-01-17 MED ORDER — FENTANYL CITRATE (PF) 250 MCG/5ML IJ SOLN
INTRAMUSCULAR | Status: DC | PRN
  Administered 2024-01-17: 25 ug via INTRAVENOUS
  Administered 2024-01-17: 50 ug via INTRAVENOUS
  Administered 2024-01-17: 100 ug via INTRAVENOUS
  Administered 2024-01-17: 25 ug via INTRAVENOUS

## 2024-01-17 MED ORDER — PHENYLEPHRINE HCL-NACL 20-0.9 MG/250ML-% IV SOLN
INTRAVENOUS | Status: DC | PRN
  Administered 2024-01-17: 30 ug/min via INTRAVENOUS

## 2024-01-17 MED ORDER — ONDANSETRON HCL 4 MG/2ML IJ SOLN
INTRAMUSCULAR | Status: DC | PRN
  Administered 2024-01-17: 4 mg via INTRAVENOUS

## 2024-01-17 MED ORDER — DEXAMETHASONE 4 MG PO TABS
4.0000 mg | ORAL_TABLET | Freq: Four times a day (QID) | ORAL | Status: DC
  Administered 2024-01-17 – 2024-01-18 (×3): 4 mg via ORAL
  Filled 2024-01-17 (×3): qty 1

## 2024-01-17 MED ORDER — PHENYLEPHRINE 80 MCG/ML (10ML) SYRINGE FOR IV PUSH (FOR BLOOD PRESSURE SUPPORT)
PREFILLED_SYRINGE | INTRAVENOUS | Status: AC
Start: 2024-01-17 — End: ?
  Filled 2024-01-17: qty 10

## 2024-01-17 MED ORDER — ACETAMINOPHEN 500 MG PO TABS
1000.0000 mg | ORAL_TABLET | Freq: Four times a day (QID) | ORAL | Status: DC
  Administered 2024-01-17 – 2024-01-18 (×3): 1000 mg via ORAL
  Filled 2024-01-17 (×3): qty 2

## 2024-01-17 MED ORDER — ALBUMIN HUMAN 5 % IV SOLN: INTRAVENOUS | Status: DC | PRN

## 2024-01-17 SURGICAL SUPPLY — 61 items
ALLOGRAFT BONE FIBER KORE 5 (Bone Implant) IMPLANT
BAG COUNTER SPONGE SURGICOUNT (BAG) ×1 IMPLANT
BASKET BONE COLLECTION (BASKET) ×1 IMPLANT
BENZOIN TINCTURE PRP APPL 2/3 (GAUZE/BANDAGES/DRESSINGS) ×1 IMPLANT
BLADE BONE MILL MEDIUM (MISCELLANEOUS) ×1 IMPLANT
BLADE CLIPPER SURG (BLADE) IMPLANT
BUR CARBIDE MATCH 3.0 (BURR) ×1 IMPLANT
CANISTER SUCT 3000ML PPV (MISCELLANEOUS) ×1 IMPLANT
CLEANSER WND VASHE 34 (WOUND CARE) ×1 IMPLANT
CLSR STERI-STRIP ANTIMIC 1/2X4 (GAUZE/BANDAGES/DRESSINGS) IMPLANT
CNTNR URN SCR LID CUP LEK RST (MISCELLANEOUS) ×1 IMPLANT
COVER BACK TABLE 60X90IN (DRAPES) ×1 IMPLANT
DERMABOND ADVANCED .7 DNX12 (GAUZE/BANDAGES/DRESSINGS) ×1 IMPLANT
DRAPE C-ARM 42X72 X-RAY (DRAPES) ×2 IMPLANT
DRAPE C-ARMOR (DRAPES) ×2 IMPLANT
DRAPE LAPAROTOMY 100X72X124 (DRAPES) ×1 IMPLANT
DRAPE SURG 17X23 STRL (DRAPES) ×1 IMPLANT
DRSG OPSITE POSTOP 4X6 (GAUZE/BANDAGES/DRESSINGS) IMPLANT
DURAPREP 26ML APPLICATOR (WOUND CARE) ×1 IMPLANT
ELECT REM PT RETURN 9FT ADLT (ELECTROSURGICAL) ×1 IMPLANT
ELECTRODE REM PT RTRN 9FT ADLT (ELECTROSURGICAL) ×1 IMPLANT
EVACUATOR 1/8 PVC DRAIN (DRAIN) ×1 IMPLANT
GAUZE 4X4 16PLY ~~LOC~~+RFID DBL (SPONGE) IMPLANT
GLOVE BIO SURGEON STRL SZ7 (GLOVE) IMPLANT
GLOVE BIO SURGEON STRL SZ8 (GLOVE) ×2 IMPLANT
GLOVE BIOGEL PI IND STRL 7.0 (GLOVE) IMPLANT
GLOVE SURG SS PI 7.5 STRL IVOR (GLOVE) IMPLANT
GOWN STRL REUS W/ TWL LRG LVL3 (GOWN DISPOSABLE) ×1 IMPLANT
GOWN STRL REUS W/ TWL XL LVL3 (GOWN DISPOSABLE) ×1 IMPLANT
GOWN STRL REUS W/TWL 2XL LVL3 (GOWN DISPOSABLE) IMPLANT
GRAFT BONE PROTEIOS MED 2.5CC (Orthopedic Implant) IMPLANT
HEMOSTAT POWDER KIT SURGIFOAM (HEMOSTASIS) ×1 IMPLANT
KIT BASIN OR (CUSTOM PROCEDURE TRAY) ×1 IMPLANT
KIT TURNOVER KIT B (KITS) ×1 IMPLANT
MILL BONE PREP (MISCELLANEOUS) ×1 IMPLANT
NDL HYPO 25X1 1.5 SAFETY (NEEDLE) ×1 IMPLANT
NEEDLE HYPO 25X1 1.5 SAFETY (NEEDLE) ×1 IMPLANT
NS IRRIG 1000ML POUR BTL (IV SOLUTION) ×1 IMPLANT
PACK LAMINECTOMY NEURO (CUSTOM PROCEDURE TRAY) ×1 IMPLANT
PAD ARMBOARD POSITIONER FOAM (MISCELLANEOUS) ×3 IMPLANT
PATTIES SURGICAL 1X1 (DISPOSABLE) IMPLANT
ROD LORD LIPPED TI 5.5X65 (Rod) IMPLANT
SCREW ILIAC PA 5.5X40 (Screw) IMPLANT
SCREW PA INVICTUS 5.5X35 (Screw) IMPLANT
SCREW POLYAXIAL TULIP (Screw) IMPLANT
SCREW SHANK MOD 5.5X40 (Screw) IMPLANT
SET SCREW SPNE (Screw) IMPLANT
SPACER IDENTITI 9X7X25 5D (Spacer) IMPLANT
SPACER IDENTITI 9X8X25 10D (Spacer) IMPLANT
SPONGE SURGIFOAM ABS GEL 100 (HEMOSTASIS) ×1 IMPLANT
SPONGE T-LAP 4X18 ~~LOC~~+RFID (SPONGE) IMPLANT
STRIP CLOSURE SKIN 1/2X4 (GAUZE/BANDAGES/DRESSINGS) ×2 IMPLANT
SUT VIC AB 0 CT1 18XCR BRD8 (SUTURE) ×1 IMPLANT
SUT VIC AB 2-0 CP2 18 (SUTURE) ×1 IMPLANT
SUT VIC AB 3-0 SH 8-18 (SUTURE) ×2 IMPLANT
SYR CONTROL 10ML LL (SYRINGE) ×1 IMPLANT
TOWEL GREEN STERILE (TOWEL DISPOSABLE) ×1 IMPLANT
TOWEL GREEN STERILE FF (TOWEL DISPOSABLE) ×1 IMPLANT
TRAY FOLEY MTR SLVR 14FR STAT (SET/KITS/TRAYS/PACK) IMPLANT
TRAY FOLEY MTR SLVR 16FR STAT (SET/KITS/TRAYS/PACK) ×1 IMPLANT
WATER STERILE IRR 1000ML POUR (IV SOLUTION) ×1 IMPLANT

## 2024-01-17 NOTE — Op Note (Signed)
 01/17/2024  4:06 PM  PATIENT:  Margaret Graham  78 y.o. female  PRE-OPERATIVE DIAGNOSIS: Degenerative disc disease L3-4 L4-5, spondylolisthesis L3-4, retrolisthesis L4-5, lumbar spinal stenosis, back pain with radiculopathy  POST-OPERATIVE DIAGNOSIS:  same  PROCEDURE:   1. Decompressive lumbar laminectomy, hemi facetectomy and foraminotomies L3-4 L4-5 requiring more work than would be required for a simple exposure of the disk for PLIF in order to adequately decompress the neural elements and address the spinal stenosis 2. Posterior lumbar interbody fusion L3-4 L4-5 using PTI interbody cages packed with morcellized allograft and autograft  3. Posterior fixation L3-L5 inclusive using ATEC cortical pedicle screws.  4. Intertransverse arthrodesis L3-L5 inclusive using morcellized autograft and allograft.  SURGEON:  Marikay Alar, MD  ASSISTANTS: Jearl Klinefelter, FNP  ANESTHESIA:  General  EBL: 250 ml  Total I/O In: 600 [IV Piggyback:600] Out: 400 [Urine:150; Blood:250]  BLOOD ADMINISTERED:none  DRAINS: none   INDICATION FOR PROCEDURE: This patient presented with back pain with leg pain. Imaging revealed severe lumbar spondylosis with spondylolisthesis L3-4 and retrolisthesis L4-5 with spinal stenosis. The patient tried a reasonable attempt at conservative medical measures without relief. I recommended decompression and instrumented fusion to address the stenosis as well as the segmental  instability.  Patient understood the risks, benefits, and alternatives and potential outcomes and wished to proceed.  PROCEDURE DETAILS:  The patient was brought to the operating room. After induction of generalized endotracheal anesthesia the patient was rolled into the prone position on chest rolls and all pressure points were padded. The patient's lumbar region was cleaned and then prepped with DuraPrep and draped in the usual sterile fashion. Anesthesia was injected and then a dorsal midline  incision was made and carried down to the lumbosacral fascia. The fascia was opened and the paraspinous musculature was taken down in a subperiosteal fashion to expose L3-4 and L4-5. A self-retaining retractor was placed. Intraoperative fluoroscopy confirmed my level, and I started with placement of the L3 cortical pedicle screws. The pedicle screw entry zones were identified utilizing surface landmarks and  AP and lateral fluoroscopy. I scored the cortex with the high-speed drill and then used the hand drill to drill an upward and outward direction into the pedicle. I then tapped line to line. I then placed a 5.5 x 40 mm cortical pedicle screw into the pedicles of L3 bilaterally.    I then turned my attention to the decompression and complete lumbar laminectomies, hemi- facetectomies, and foraminotomies were performed at L3-4 and L4-5.  My nurse practitioner was directly involved in the decompression and exposure of the neural elements. the patient had significant spinal stenosis and this required more work than would be required for a simple exposure of the disc for posterior lumbar interbody fusion which would only require a limited laminotomy. Much more generous decompression and generous foraminotomy was undertaken in order to adequately decompress the neural elements and address the patient's leg pain. The yellow ligament was removed to expose the underlying dura and nerve roots, and generous foraminotomies were performed to adequately decompress the neural elements. Both the exiting and traversing nerve roots were decompressed on both sides until a coronary dilator passed easily along the nerve roots. Once the decompression was complete, I turned my attention to the posterior lower lumbar interbody fusion. The epidural venous vasculature was coagulated and cut sharply. Disc space was incised and the initial discectomy was performed with pituitary rongeurs. The disc space was distracted with sequential  distractors to a height of 8  mm. We then used a series of scrapers and shavers to prepare the endplates for fusion. The midline was prepared with Epstein curettes. Once the complete discectomy was finished, we packed an appropriate sized interbody cage with local autograft and morcellized allograft, gently retracted the nerve root, and tapped the cage into position at L3-4 and L4-5 bilaterally.  The midline between the cages was packed with morselized autograft and allograft.   We then turned our attention to the placement of the lower pedicle screws. The pedicle screw entry zones were identified utilizing surface landmarks and fluoroscopy. I drilled into each pedicle utilizing the hand drill, and tapped each pedicle with the appropriate tap. We palpated with a ball probe to assure no break in the cortex. We then placed 5.5 x 40 mm pedicle screws into the pedicles bilaterally at L4 and L5.  My nurse practitioner assisted in placement of the pedicle screws.  We then decorticated the transverse processes and laid a mixture of morcellized autograft and allograft out over these to perform intertransverse arthrodesis at L3-L5. We then placed lordotic rods into the multiaxial screw heads of the pedicle screws and locked these in position with the locking caps and anti-torque device. We then checked our construct with AP and lateral fluoroscopy. Irrigated with copious amounts of Vashe solution. Inspected the nerve roots once again to assure adequate decompression, lined to the dura with Gelfoam,  and then we closed the muscle and the fascia with 0 Vicryl. Closed the subcutaneous tissues with 2-0 Vicryl and subcuticular tissues with 3-0 Vicryl. The skin was closed with benzoin and Steri-Strips. Dressing was then applied, the patient was awakened from general anesthesia and transported to the recovery room in stable condition. At the end of the procedure all sponge, needle and instrument counts were correct.   PLAN OF  CARE: admit to inpatient  PATIENT DISPOSITION:  PACU - hemodynamically stable.   Delay start of Pharmacological VTE agent (>24hrs) due to surgical blood loss or risk of bleeding:  yes

## 2024-01-17 NOTE — H&P (Signed)
 Subjective: Patient is a 78 y.o. female admitted for back pain. Onset of symptoms was several years ago, gradually worsening since that time.  The pain is rated severe, and is located at the across the lower back and radiates to R leg. The pain is described as aching and occurs all day. The symptoms have been progressive. Symptoms are exacerbated by exercise and standing. MRI or CT showed spondylolisthesis L3-4, retrolisthesis L4-5   Past Medical History:  Diagnosis Date   Arthritis    GERD (gastroesophageal reflux disease)    Heart murmur    Hyperlipidemia    Hypothyroidism    Internal hemorrhoid    Osteopenia    Schatzki's ring    Sleep apnea    does not use a cpap machine   Thyroid disease    Thyroid nodule    right 1.7 cm   Tubular adenoma     Past Surgical History:  Procedure Laterality Date   ABDOMINAL HYSTERECTOMY     BREAST SURGERY     for dilated duct   CATARACT EXTRACTION Bilateral    GLUTEUS MINIMUS REPAIR Left 02/12/2023   Procedure: LEFT GLUTEUS MEDIUS REPAIR WITH POSSIBLE COLLAGEN PATCH PLACEMENT;  Surgeon: Huel Cote, MD;  Location: [facility];  Service: Orthopedics;  Laterality: Left;   left thyroid lobectomy  2004   MYRINGOTOMY WITH TUBE PLACEMENT Left 12/19/2022   Procedure: MYRINGOTOMY WITH TUBE PLACEMENT;  Surgeon: Laren Boom, DO;  [facility];  Service: ENT;  Laterality: Left;    Prior to Admission medications   Medication Sig Start Date End Date Taking? Authorizing Provider  alendronate (FOSAMAX) 70 MG tablet Take 70 mg by mouth every Wednesday. Take with a full glass of water on an empty stomach.   Yes [provider]  Ascorbic Acid (VITAMIN C) 1000 MG tablet Take 2,000 mg by mouth daily.   Yes [provider]  ASPIRIN 81 PO Take 1 tablet by mouth daily.   Yes [provider]  Calcium Carb-Cholecalciferol (CALCIUM 600 + D PO) Take 1 tablet by mouth 2 (two) times daily.   Yes [provider]  Carboxymethylcellulose Sodium (REFRESH PLUS OP) Place 1 drop into both eyes 2 (two) times daily. PF   Yes [provider]  ezetimibe (ZETIA) 10 MG tablet Take 10 mg by mouth daily.   Yes [provider]  famotidine (PEPCID) 40 MG tablet Take 1 tablet (40 mg total) by mouth at bedtime. 09/12/23  Yes Danis, Andreas Blower, MD  levothyroxine (SYNTHROID, LEVOTHROID) 100 MCG tablet Take 1 tablet (100 mcg total) by mouth every morning. 08/05/16  Yes Copland, Gwenlyn Found, MD  Multiple Vitamin (MULTIVITAMIN) capsule Take 1 capsule by mouth daily.   Yes [provider]  Multiple Vitamins-Minerals (PRESERVISION AREDS 2) CAPS Take 1 tablet by mouth 2 (two) times daily.   Yes [provider]  Phenyleph-Doxylamine-DM-APAP (NYQUIL SEVERE COLD/FLU) 5-6.25-10-325 MG/15ML LIQD Take 30 mLs by mouth at bedtime.   Yes [provider]  simvastatin (ZOCOR) 20 MG tablet Take 1 tablet (20 mg total) by mouth daily. 10/24/16  Yes Copland, Gwenlyn Found, MD   Allergies  Allergen Reactions   Declomycin [Demeclocycline]     Bad sunburn     Social History   Tobacco Use   Smoking status: Never   Smokeless tobacco: Never  Substance Use Topics   Alcohol use: Yes    Alcohol/week: 7.0 standard drinks of alcohol    Types: 7 Glasses of wine  per week    Comment: 1 glass a day typically    Family History  Problem Relation Age of Onset   Transient ischemic attack Mother    Arthritis Mother    Transient ischemic attack Father    Arthritis Father    Hyperlipidemia Father    Thyroid disease Sister    Diabetes Son    Asthma Son    Heart disease Paternal Uncle    Colon cancer Neg Hx    Esophageal cancer Neg Hx    Rectal cancer Neg Hx    Sleep apnea Neg Hx      Review of Systems  Positive ROS: neg  All other systems have been reviewed and were otherwise negative with the exception of those mentioned in the HPI and as above.  Objective: Vital signs in last 24 hours: Temp:   [97.6 F (36.4 C)] 97.6 F (36.4 C) (03/21 1111) Pulse Rate:  [74] 74 (03/21 1111) Resp:  [18] 18 (03/21 1111) BP: (133)/(70) 133/70 (03/21 1111) SpO2:  [99 %] 99 % (03/21 1111) Weight:  [70.3 kg] 70.3 kg (03/21 1111)  General Appearance: Alert, cooperative, no distress, appears stated age Head: Normocephalic, without obvious abnormality, atraumatic Eyes: PERRL, conjunctiva/corneas clear, EOM's intact    Neck: Supple, symmetrical, trachea midline Back: Symmetric, no curvature, ROM normal, no CVA tenderness Lungs:  respirations unlabored Heart: Regular rate and rhythm Abdomen: Soft, non-tender Extremities: Extremities normal, atraumatic, no cyanosis or edema Pulses: 2+ and symmetric all extremities Skin: Skin color, texture, turgor normal, no rashes or lesions  NEUROLOGIC:   Mental status: Alert and oriented x4,  no aphasia, good attention span, fund of knowledge, and memory Motor Exam - grossly normal Sensory Exam - grossly normal Reflexes: 1+ Coordination - grossly normal Gait - grossly normal Balance - grossly normal Cranial Nerves: I: smell Not tested  II: visual acuity  OS: nl    OD: nl  II: visual fields Full to confrontation  II: pupils Equal, round, reactive to light  III,VII: ptosis None  III,IV,VI: extraocular muscles  Full ROM  V: mastication Normal  V: facial light touch sensation  Normal  V,VII: corneal reflex  Present  VII: facial muscle function - upper  Normal  VII: facial muscle function - lower Normal  VIII: hearing Not tested  IX: soft palate elevation  Normal  IX,X: gag reflex Present  XI: trapezius strength  5/5  XI: sternocleidomastoid strength 5/5  XI: neck flexion strength  5/5  XII: tongue strength  Normal    Data Review Lab Results  Component Value Date   WBC 8.9 01/09/2024   HGB 13.4 01/09/2024   HCT 41.8 01/09/2024   MCV 91.7 01/09/2024   PLT 406 (H) 01/09/2024   Lab Results  Component Value Date   NA 137 05/24/2021   K 3.9  05/24/2021   CL 102 05/24/2021   CO2 26 05/24/2021   BUN 22 05/24/2021   CREATININE 0.79 05/24/2021   GLUCOSE 85 05/24/2021   Lab Results  Component Value Date   INR 1.0 01/09/2024    Assessment/Plan:  Estimated body mass index is 25.79 kg/m as calculated from the following:   Height as of this encounter: 5\' 5"  (1.651 m).   Weight as of this encounter: 70.3 kg. Patient admitted for PLIF L3-4 L4-5. Patient has failed a reasonable attempt at conservative therapy.  I explained the condition and procedure to the patient and answered any questions.  Patient wishes to proceed with procedure as planned.  Understands risks/ benefits and typical outcomes of procedure.   Tia Alert 01/17/2024 12:26 PM

## 2024-01-17 NOTE — Transfer of Care (Signed)
 Immediate Anesthesia Transfer of Care Note  Patient: Margaret Graham  Procedure(s) Performed: Posterior Lumbar Interbody Fusion - Lumbar three-Lumbar four - Lumbar four-Lumbar five - Posterior Lateral and Interbody fusion (Back)  Patient Location: PACU  Anesthesia Type:General  Level of Consciousness: awake and alert   Airway & Oxygen Therapy: Patient Spontanous Breathing and Patient connected to nasal cannula oxygen  Post-op Assessment: Report given to RN  Post vital signs: Reviewed and stable  Last Vitals:  Vitals Value Taken Time  BP 117/65 01/17/24 1632  Temp 35.7 C 01/17/24 1632  Pulse 78 01/17/24 1637  Resp 14 01/17/24 1637  SpO2 99 % 01/17/24 1637  Vitals shown include unfiled device data.  Last Pain:  Vitals:   01/17/24 1128  TempSrc:   PainSc: 0-No pain         Complications: No notable events documented.

## 2024-01-17 NOTE — Anesthesia Procedure Notes (Signed)
 Procedure Name: Intubation Date/Time: 01/17/2024 12:53 PM  Performed by: Randon Goldsmith, CRNAPre-anesthesia Checklist: Patient identified, Emergency Drugs available, Suction available and Patient being monitored Patient Re-evaluated:Patient Re-evaluated prior to induction Oxygen Delivery Method: Circle system utilized Preoxygenation: Pre-oxygenation with 100% oxygen Induction Type: IV induction Ventilation: Mask ventilation without difficulty Laryngoscope Size: Mac and 4 Grade View: Grade I Tube type: Oral Tube size: 7.0 mm Number of attempts: 1 Airway Equipment and Method: Stylet and Oral airway Placement Confirmation: ETT inserted through vocal cords under direct vision, positive ETCO2 and breath sounds checked- equal and bilateral Secured at: 22 cm Tube secured with: Tape Dental Injury: Teeth and Oropharynx as per pre-operative assessment  Comments: Performed by Carlynn Herald under direct supervision of Dr. Armond Hang and Randon Goldsmith, CRNA

## 2024-01-18 DIAGNOSIS — M48061 Spinal stenosis, lumbar region without neurogenic claudication: Secondary | ICD-10-CM | POA: Diagnosis not present

## 2024-01-18 DIAGNOSIS — M5416 Radiculopathy, lumbar region: Secondary | ICD-10-CM | POA: Diagnosis not present

## 2024-01-18 DIAGNOSIS — Z79899 Other long term (current) drug therapy: Secondary | ICD-10-CM | POA: Diagnosis not present

## 2024-01-18 DIAGNOSIS — Z7982 Long term (current) use of aspirin: Secondary | ICD-10-CM | POA: Diagnosis not present

## 2024-01-18 DIAGNOSIS — M4316 Spondylolisthesis, lumbar region: Secondary | ICD-10-CM | POA: Diagnosis not present

## 2024-01-18 DIAGNOSIS — E039 Hypothyroidism, unspecified: Secondary | ICD-10-CM | POA: Diagnosis not present

## 2024-01-18 MED ORDER — OXYCODONE HCL 5 MG PO TABS: 5.0000 mg | ORAL_TABLET | Freq: Four times a day (QID) | ORAL | 0 refills | Status: DC | PRN

## 2024-01-18 MED ORDER — TIZANIDINE HCL 2 MG PO TABS: 2.0000 mg | ORAL_TABLET | Freq: Three times a day (TID) | ORAL | 1 refills | Status: DC | PRN

## 2024-01-18 NOTE — Discharge Instructions (Signed)
 Spinal Fusion Care After Refer to this sheet in the next few weeks. These instructions provide you with information on caring for yourself after your procedure. Your caregiver may also give you more specific instructions. Your treatment has been planned according to current medical practices, but problems sometimes occur. Call your caregiver if you have any problems or questions after your procedure. HOME CARE INSTRUCTIONS  Take whatever pain medicine has been prescribed by your caregiver. Do not take over-the-counter pain medicine unless directed otherwise by your caregiver.  Do not drive if you are taking narcotic pain medicines.  Change your bandage (dressing) if necessary or as directed by your caregiver.  You may shower. The wound may get wet, simply pat the area dry.  If you have been prescribed medicine to prevent your blood from clotting, follow the directions carefully.  Check the area around your incision often. Look for redness and swelling. Also, look for anything leaking from your wound. You can use a mirror or have a family member inspect your incision if it is in a place where it is difficult for you to see.  Ask your caregiver what activities you should avoid and for how long.  Walk as much as possible.  Do not lift anything heavier than 5 lbs until your caregiver says it is safe.  Do not twist or bend for a few weeks. Try not to pull on things. Avoid sitting for long periods of time. Change positions at least every hour.

## 2024-01-18 NOTE — Anesthesia Postprocedure Evaluation (Signed)
 Anesthesia Post Note  Patient: Margaret Graham  Procedure(s) Performed: Posterior Lumbar Interbody Fusion - Lumbar three-Lumbar four - Lumbar four-Lumbar five - Posterior Lateral and Interbody fusion (Back)     Patient location during evaluation: PACU Anesthesia Type: General Level of consciousness: awake and alert Pain management: pain level controlled Vital Signs Assessment: post-procedure vital signs reviewed and stable Respiratory status: spontaneous breathing, nonlabored ventilation, respiratory function stable and patient connected to nasal cannula oxygen Cardiovascular status: blood pressure returned to baseline and stable Postop Assessment: no apparent nausea or vomiting Anesthetic complications: no  No notable events documented.  Last Vitals:  Vitals:   01/18/24 0452 01/18/24 0719  BP: 99/77 (!) 108/57  Pulse: 79 76  Resp: 16 19  Temp: 36.7 C 36.6 C  SpO2: 99% 100%    Last Pain:  Vitals:   01/18/24 0719  TempSrc: Oral  PainSc:                  Margaret Graham

## 2024-01-18 NOTE — Plan of Care (Signed)
  Problem: Education: Goal: Knowledge of General Education information will improve Description: Including pain rating scale, medication(s)/side effects and non-pharmacologic comfort measures Outcome: Adequate for Discharge   Problem: Health Behavior/Discharge Planning: Goal: Ability to manage health-related needs will improve Outcome: Adequate for Discharge   Problem: Clinical Measurements: Goal: Ability to maintain clinical measurements within normal limits will improve Outcome: Adequate for Discharge Goal: Will remain free from infection Outcome: Adequate for Discharge Goal: Diagnostic test results will improve Outcome: Adequate for Discharge Goal: Respiratory complications will improve Outcome: Adequate for Discharge Goal: Cardiovascular complication will be avoided Outcome: Adequate for Discharge   Problem: Activity: Goal: Risk for activity intolerance will decrease Outcome: Adequate for Discharge   Problem: Nutrition: Goal: Adequate nutrition will be maintained Outcome: Adequate for Discharge   Problem: Coping: Goal: Level of anxiety will decrease Outcome: Adequate for Discharge   Problem: Elimination: Goal: Will not experience complications related to bowel motility Outcome: Adequate for Discharge Goal: Will not experience complications related to urinary retention Outcome: Adequate for Discharge   Problem: Pain Managment: Goal: General experience of comfort will improve and/or be controlled Outcome: Adequate for Discharge   Problem: Safety: Goal: Ability to remain free from injury will improve Outcome: Adequate for Discharge   Problem: Skin Integrity: Goal: Risk for impaired skin integrity will decrease Outcome: Adequate for Discharge   Problem: Education: Goal: Ability to verbalize activity precautions or restrictions will improve Outcome: Adequate for Discharge Goal: Knowledge of the prescribed therapeutic regimen will improve Outcome: Adequate for  Discharge Goal: Understanding of discharge needs will improve Outcome: Adequate for Discharge   Problem: Activity: Goal: Ability to avoid complications of mobility impairment will improve Outcome: Adequate for Discharge Goal: Ability to tolerate increased activity will improve Outcome: Adequate for Discharge Goal: Will remain free from falls Outcome: Adequate for Discharge   Problem: Bowel/Gastric: Goal: Gastrointestinal status for postoperative course will improve Outcome: Adequate for Discharge   Problem: Clinical Measurements: Goal: Ability to maintain clinical measurements within normal limits will improve Outcome: Adequate for Discharge Goal: Postoperative complications will be avoided or minimized Outcome: Adequate for Discharge Goal: Diagnostic test results will improve Outcome: Adequate for Discharge   Problem: Pain Management: Goal: Pain level will decrease Outcome: Adequate for Discharge   Problem: Skin Integrity: Goal: Will show signs of wound healing Outcome: Adequate for Discharge   Problem: Health Behavior/Discharge Planning: Goal: Identification of resources available to assist in meeting health care needs will improve Outcome: Adequate for Discharge   Problem: Bladder/Genitourinary: Goal: Urinary functional status for postoperative course will improve Outcome: Adequate for Discharge

## 2024-01-18 NOTE — Evaluation (Signed)
 Physical Therapy Evaluation Patient Details Name: Margaret Graham MRN: [id number] DOB: [birth year] Today's Date: 01/18/2024  History of Present Illness  Pt is a 78yo female who underwent L3-4 and L4-5 PLIF on 3/21.  PMH/PSH: gluteus minimus repear 01/2023  Clinical Impression  Pt underwent back surgery. Demonstrates understanding of back precautions and how to don/doff back brace. Pt benefits from ambulation with RW at this time as pt with trendelenburg gait, instability and lateral sway when amb without AD. PT with recent gluteus minimus surgery as well contributing to unsteady gait pattern without use of RW. Pt with good home support. Pt to benefit from outpt PT once cleared by MD to address hip and core/back strengthening. Pt with no further acute PT needs at this time. PT SIGNING OFF. Please re-consult if needed in future.        If plan is discharge home, recommend the following: A little help with walking and/or transfers;Assistance with cooking/housework;Assist for transportation;Help with stairs or ramp for entrance   Can travel by private vehicle        Equipment Recommendations None recommended by PT  Recommendations for Other Services       Functional Status Assessment Patient has had a recent decline in their functional status and demonstrates the ability to make significant improvements in function in a reasonable and predictable amount of time.     Precautions / Restrictions Precautions Precautions: Back Precaution Booklet Issued: Yes (comment) Recall of Precautions/Restrictions: Intact Precaution/Restrictions Comments: pt with good understanding Required Braces or Orthoses: Spinal Brace Spinal Brace: Lumbar corset Restrictions Weight Bearing Restrictions Per Provider Order: No      Mobility  Bed Mobility Overal bed mobility: Needs Assistance Bed Mobility: Rolling, Sidelying to Sit, Sit to Sidelying Rolling: Modified independent (Device/Increase  time) Sidelying to sit: Modified independent (Device/Increase time)     Sit to sidelying: Modified independent (Device/Increase time) General bed mobility comments: directional verbal cues for log rolling technique    Transfers Overall transfer level: Needs assistance Equipment used: Rolling walker (2 wheels) Transfers: Sit to/from Stand Sit to Stand: Contact guard assist           General transfer comment: verbal cues for hand placement, increased time, guarded/cautious    Ambulation/Gait Ambulation/Gait assistance: Contact guard assist, Min assist Gait Distance (Feet): 200 Feet Assistive device: Rolling walker (2 wheels), 1 person hand held assist Gait Pattern/deviations: Step-to pattern, Step-through pattern Gait velocity: dec Gait velocity interpretation: <1.31 ft/sec, indicative of household ambulator   General Gait Details: pt with step through, fluid gait pattern when using rolling walker, pt wanted to try ambulation without rolling walker, pt with short step through pattern with trendelenberg gait pattern, instability and lateral sway. recommended pt to use rolling walker until pain improves  Stairs Stairs: Yes Stairs assistance: Contact guard assist Stair Management: One rail Left, Step to pattern, Sideways Number of Stairs: 5 General stair comments: educated on standing parallel to rail to all for use of both hands on railing to increased support and adhere to back precautions. Pt with good understanding  Wheelchair Mobility     Tilt Bed    Modified Rankin (Stroke Patients Only)       Balance Overall balance assessment: Mild deficits observed, not formally tested (benefits from rolling for amb)  Pertinent Vitals/Pain Pain Assessment Pain Assessment: 0-10 Pain Score: 5  Pain Location: back Pain Descriptors / Indicators: Sore Pain Intervention(s): Monitored during session    Home Living  Family/patient expects to be discharged to:: Private residence Living Arrangements: Spouse/significant other Available Help at Discharge: Family;Available 24 hours/day Type of Home: House Home Access: Level entry     Alternate Level Stairs-Number of Steps: flight Home Layout: Two level Home Equipment: Rollator (4 wheels)      Prior Function Prior Level of Function : Independent/Modified Independent             Mobility Comments: used cane since gluteus surgery last year ADLs Comments: indep     Extremity/Trunk Assessment   Upper Extremity Assessment Upper Extremity Assessment: Overall WFL for tasks assessed    Lower Extremity Assessment Lower Extremity Assessment: LLE deficits/detail;Generalized weakness LLE Deficits / Details: pt with weak Hip abductor, recent gluteus minimus surgery    Cervical / Trunk Assessment Cervical / Trunk Assessment: Back Surgery  Communication   Communication Communication: No apparent difficulties    Cognition Arousal: Alert Behavior During Therapy: WFL for tasks assessed/performed (can be tangential)   PT - Cognitive impairments: No apparent impairments                         Following commands: Intact       Cueing Cueing Techniques: Verbal cues, Tactile cues     General Comments General comments (skin integrity, edema, etc.): incision covered with dressing    Exercises     Assessment/Plan    PT Assessment Patient does not need any further PT services  PT Problem List         PT Treatment Interventions      PT Goals (Current goals can be found in the Care Plan section)  Acute Rehab PT Goals Patient Stated Goal: home today PT Goal Formulation: All assessment and education complete, DC therapy    Frequency       Co-evaluation               AM-PAC PT "6 Clicks" Mobility  Outcome Measure Help needed turning from your back to your side while in a flat bed without using bedrails?: None Help  needed moving from lying on your back to sitting on the side of a flat bed without using bedrails?: None Help needed moving to and from a bed to a chair (including a wheelchair)?: None Help needed standing up from a chair using your arms (e.g., wheelchair or bedside chair)?: None Help needed to walk in hospital room?: A Little Help needed climbing 3-5 steps with a railing? : A Little 6 Click Score: 22    End of Session Equipment Utilized During Treatment: Back brace Activity Tolerance: Patient tolerated treatment well Patient left: in bed;with call bell/phone within reach Nurse Communication: Mobility status PT Visit Diagnosis: Unsteadiness on feet (R26.81)    Time: 1610-9604 PT Time Calculation (min) (ACUTE ONLY): 37 min   Charges:   PT Evaluation $PT Eval Moderate Complexity: 1 Mod PT Treatments $Gait Training: 8-22 mins PT General Charges $$ ACUTE PT VISIT: 1 Visit         Lewis Shock, PT, DPT Acute Rehabilitation Services Secure chat preferred Office #: [phone]   Iona Hansen 01/18/2024, 9:39 AM

## 2024-01-18 NOTE — Progress Notes (Signed)
 Explained discharge instructions to patient. Reviewed follow up appointment and next medication administration times. Also reviewed education. Patient verbalized having an understanding for instructions given. All belongings are in the patient's possession. No other needs verbalized. Transported downstairs for discharge.

## 2024-01-18 NOTE — Care Management Obs Status (Signed)
 MEDICARE OBSERVATION STATUS NOTIFICATION   Patient Details  Name: Margaret Graham MRN: [id number] Date of Birth: May 11, 1946   Medicare Observation Status Notification Given:  Yes    Lawerance Sabal, RN 01/18/2024, 11:50 AM

## 2024-01-18 NOTE — Care Management CC44 (Signed)
 Condition Code 44 Documentation Completed  Patient Details  Name: Margaret Graham MRN: [id number] Date of Birth: Feb 14, 1946   Condition Code 44 given:  Yes Patient signature on Condition Code 44 notice:  Yes Documentation of 2 MD's agreement:  Yes Code 44 added to claim:  Yes    Lawerance Sabal, RN 01/18/2024, 11:51 AM

## 2024-01-18 NOTE — Discharge Summary (Signed)
 Physician Discharge Summary  Patient ID: Margaret Graham MRN: [id number] DOB/AGE: 05-May-1946 78 y.o.  Admit date: 01/17/2024 Discharge date: 01/18/2024  Admission Diagnoses:Degenerative disc disease L3-4 L4-5, spondylolisthesis L3-4, retrolisthesis L4-5, lumbar spinal stenosis, back pain with radiculopathy   Discharge Diagnoses: Degenerative disc disease L3-4 L4-5, spondylolisthesis L3-4, retrolisthesis L4-5, lumbar spinal stenosis, back pain with radiculopathy  Principal Problem:   S/P lumbar fusion   Discharged Condition: good  Hospital Course: Mrs. Nuon was admitted and taken to the operating room for an uncomplicated lumbar decompression and arthrodesis at L3/4, 4/5. Post op she is ambulating, voiding, and tolerating a regular diet. Her wound is clean, dry, and no signs of infection.   Treatments: surgery: 1. Decompressive lumbar laminectomy, hemi facetectomy and foraminotomies L3-4 L4-5 requiring more work than would be required for a simple exposure of the disk for PLIF in order to adequately decompress the neural elements and address the spinal stenosis 2. Posterior lumbar interbody fusion L3-4 L4-5 using PTI interbody cages packed with morcellized allograft and autograft  3. Posterior fixation L3-L5 inclusive using ATEC cortical pedicle screws.  4. Intertransverse arthrodesis L3-L5 inclusive using morcellized autograft and allograft.    Discharge Exam: Blood pressure (!) 108/57, pulse 76, temperature 97.8 F (36.6 C), temperature source Oral, resp. rate 19, height 5\' 5"  (1.651 m), weight 70.3 kg, SpO2 100%. General appearance: alert, cooperative, appears stated age, and no distress  Disposition: Discharge disposition: 01-Home or Self Care      Spondylolisthesis  Allergies as of 01/18/2024       Reactions   Declomycin [demeclocycline]    Bad sunburn         Medication List     TAKE these medications    alendronate 70 MG tablet Commonly known as:  FOSAMAX Take 70 mg by mouth every Wednesday. Take with a full glass of water on an empty stomach.   ASPIRIN 81 PO Take 1 tablet by mouth daily.   CALCIUM 600 + D PO Take 1 tablet by mouth 2 (two) times daily.   chlorhexidine 4 % external liquid Commonly known as: HIBICLENS Apply 15 mLs (1 Application total) topically as directed for 30 doses. Use as directed daily for 5 days every other week for 6 weeks.   ezetimibe 10 MG tablet Commonly known as: ZETIA Take 10 mg by mouth daily.   famotidine 40 MG tablet Commonly known as: PEPCID Take 1 tablet (40 mg total) by mouth at bedtime.   levothyroxine 100 MCG tablet Commonly known as: SYNTHROID Take 1 tablet (100 mcg total) by mouth every morning.   multivitamin capsule Take 1 capsule by mouth daily.   mupirocin ointment 2 % Commonly known as: BACTROBAN Place 1 Application into the nose 2 (two) times daily for 60 doses. Use as directed 2 times daily for 5 days every other week for 6 weeks.   NyQuil Severe Cold/Flu 5-6.25-10-325 MG/15ML Liqd Generic drug: Phenyleph-Doxylamine-DM-APAP Take 30 mLs by mouth at bedtime.   oxyCODONE 5 MG immediate release tablet Commonly known as: Roxicodone Take 1 tablet (5 mg total) by mouth every 6 (six) hours as needed for severe pain (pain score 7-10).   PreserVision AREDS 2 Caps Take 1 tablet by mouth 2 (two) times daily.   REFRESH PLUS OP Place 1 drop into both eyes 2 (two) times daily. PF   simvastatin 20 MG tablet Commonly known as: ZOCOR Take 1 tablet (20 mg total) by mouth daily.   tiZANidine 2 MG tablet Commonly known as: ZANAFLEX  Take 1 tablet (2 mg total) by mouth every 8 (eight) hours as needed for muscle spasms.   vitamin C 1000 MG tablet Take 2,000 mg by mouth daily.               Durable Medical Equipment  (From admission, onward)           Start     Ordered   01/17/24 1829  DME Walker rolling  Once       Question:  Patient needs a walker to treat with  the following condition  Answer:  S/P lumbar fusion   01/17/24 1828   01/17/24 1829  DME 3 n 1  Once        01/17/24 1828             Signed: Coletta Memos 01/18/2024, 11:35 AM

## 2024-01-18 NOTE — Evaluation (Signed)
 Occupational Therapy Evaluation Patient Details Name: Margaret Graham MRN: [id number] DOB: [birth year] Today's Date: 01/18/2024   History of Present Illness   Pt is a 78yo female who underwent L3-4 and L4-5 PLIF on 3/21.  PMH/PSH: gluteus minimus repear 01/2023     Clinical Impressions PTA, pt reports she was independent with ADL/IADL and functional mobility with use of spc. Pt currently requires contact guard assistance for functional mobility at RW level and minimal cues for safe mobility and for compensatory strategies during ADL completion for adherence to back precautions. She demonstrated ability to don/doff back brace, UB clothing, LB clothing at modified independent level, requiring assistance with her shoes. Pt report her husband is able to assist her as needed at home. Patient evaluated by Occupational Therapy with no further acute OT needs identified. All education has been completed and the patient has no further questions. See below for any follow-up Occupational Therapy or equipment needs. OT to sign off. Thank you for referral.       If plan is discharge home, recommend the following:   Assistance with cooking/housework;Assist for transportation     Functional Status Assessment   Patient has had a recent decline in their functional status and demonstrates the ability to make significant improvements in function in a reasonable and predictable amount of time.     Equipment Recommendations   None recommended by OT     Recommendations for Other Services         Precautions/Restrictions   Precautions Precautions: Back Precaution Booklet Issued: Yes (comment) Recall of Precautions/Restrictions: Intact Precaution/Restrictions Comments: pt with good understanding Required Braces or Orthoses: Spinal Brace Spinal Brace: Lumbar corset Restrictions Weight Bearing Restrictions Per Provider Order: No     Mobility Bed Mobility Overal bed mobility:  Modified Independent Bed Mobility: Rolling, Sidelying to Sit, Sit to Sidelying Rolling: Modified independent (Device/Increase time) Sidelying to sit: Modified independent (Device/Increase time)     Sit to sidelying: Modified independent (Device/Increase time) General bed mobility comments: good carry over of technique from PT evaluation    Transfers Overall transfer level: Needs assistance Equipment used: Rolling walker (2 wheels) Transfers: Sit to/from Stand Sit to Stand: Supervision           General transfer comment: demonstrated good carry over of safe hand placement      Balance Overall balance assessment: Mild deficits observed, not formally tested (benefits from rolling for amb)                                         ADL either performed or assessed with clinical judgement   ADL Overall ADL's : Needs assistance/impaired                                     Functional mobility during ADLs: Rolling walker (2 wheels);Contact guard assist General ADL Comments: min cues for appropriate compensatory strategies and for adherence to back precautions, pt required assistance for donning shoes while sitting EOB, demonstrated ability to don UB clothing and pants while sitting EOB, reviewed additional techniques for grooming showering and IADL     Vision         Perception         Praxis         Pertinent Vitals/Pain Pain Assessment Pain Assessment: 0-10 Pain Score:  1  Pain Location: back Pain Descriptors / Indicators: Sore Pain Intervention(s): Monitored during session     Extremity/Trunk Assessment Upper Extremity Assessment Upper Extremity Assessment: Overall WFL for tasks assessed   Lower Extremity Assessment Lower Extremity Assessment: Defer to PT evaluation LLE Deficits / Details: pt with weak Hip abductor, recent gluteus minimus surgery   Cervical / Trunk Assessment Cervical / Trunk Assessment: Back Surgery    Communication Communication Communication: No apparent difficulties   Cognition Arousal: Alert Behavior During Therapy: WFL for tasks assessed/performed (can be tangential) Cognition: No apparent impairments                               Following commands: Intact       Cueing  General Comments   Cueing Techniques: Verbal cues;Tactile cues  incision dressing intact   Exercises     Shoulder Instructions      Home Living Family/patient expects to be discharged to:: Private residence Living Arrangements: Spouse/significant other Available Help at Discharge: Family;Available 24 hours/day Type of Home: House Home Access: Level entry     Home Layout: Two level Alternate Level Stairs-Number of Steps: flight Alternate Level Stairs-Rails: Left Bathroom Shower/Tub: Chief Strategy Officer: Standard     Home Equipment: Rollator (4 wheels)          Prior Functioning/Environment Prior Level of Function : Independent/Modified Independent             Mobility Comments: used cane since gluteus surgery last year ADLs Comments: independent    OT Problem List: Impaired balance (sitting and/or standing);Decreased knowledge of precautions   OT Treatment/Interventions:        OT Goals(Current goals can be found in the care plan section)   Acute Rehab OT Goals Patient Stated Goal: to go home today OT Goal Formulation: With patient Time For Goal Achievement: 01/25/24 Potential to Achieve Goals: Good   OT Frequency:       Co-evaluation              AM-PAC OT "6 Clicks" Daily Activity     Outcome Measure Help from another person eating meals?: None Help from another person taking care of personal grooming?: None Help from another person toileting, which includes using toliet, bedpan, or urinal?: None Help from another person bathing (including washing, rinsing, drying)?: A Little Help from another person to put on and taking off  regular upper body clothing?: None Help from another person to put on and taking off regular lower body clothing?: A Little 6 Click Score: 22   End of Session Equipment Utilized During Treatment: Rolling walker (2 wheels);Back brace Nurse Communication: Mobility status  Activity Tolerance: Patient tolerated treatment well Patient left: in chair;with call bell/phone within reach  OT Visit Diagnosis: Other abnormalities of gait and mobility (R26.89)                Time: 1660-6301 OT Time Calculation (min): 36 min Charges:  OT General Charges $OT Visit: 1 Visit OT Evaluation $OT Eval Low Complexity: 1 Low OT Treatments $Self Care/Home Management : 8-22 mins  Rosey Bath OTR/L Acute Rehabilitation Services Office: [phone]   Providence Crosby 01/18/2024, 10:06 AM

## 2024-01-20 MED FILL — Thrombin For Soln 5000 Unit: CUTANEOUS | Qty: 5000 | Status: AC

## 2024-02-04 ENCOUNTER — Telehealth: Payer: Self-pay | Admitting: Gastroenterology

## 2024-02-04 MED ORDER — FAMOTIDINE 40 MG PO TABS
40.0000 mg | ORAL_TABLET | Freq: Every day | ORAL | 0 refills | Status: DC
Start: 2024-02-04 — End: 2024-02-04

## 2024-02-04 MED ORDER — FAMOTIDINE 40 MG PO TABS: 40.0000 mg | ORAL_TABLET | Freq: Every day | ORAL | 0 refills | Status: DC

## 2024-02-04 NOTE — Addendum Note (Signed)
 Addended by: [place], Wisdom Seybold E on: 02/04/2024 03:28 PM   Modules accepted: Orders

## 2024-02-04 NOTE — Telephone Encounter (Signed)
 Inbound call from patient stating she needed a refill for Pepcid. Advised patient she needed to make an appointment. Patient was scheduled for 4/29 at 3:00 with Hyacinth Meeker. Please advise.

## 2024-02-04 NOTE — Telephone Encounter (Signed)
 Margaret Graham is requesting a 90 day supply of famotidine be sent in, saves money. I will resend it.

## 2024-02-04 NOTE — Telephone Encounter (Signed)
 The pt has been advised that a refill was sent but she will need to keep her appt for further refills.

## 2024-02-06 NOTE — Therapy (Signed)
 OUTPATIENT PHYSICAL THERAPY LOWER EXTREMITY TREATMENT NOTE   Patient Name: Margaret Graham MRN: [id number] DOB:06-Nov-1945, 78 y.o., female Today's Date: 02/06/2024  END OF SESSION:       Past Medical History:  Diagnosis Date   Arthritis    GERD (gastroesophageal reflux disease)    Heart murmur    Hyperlipidemia    Hypothyroidism    Internal hemorrhoid    Osteopenia    Schatzki's ring    Sleep apnea    does not use a cpap machine   Thyroid disease    Thyroid nodule    right 1.7 cm   Tubular adenoma    Past Surgical History:  Procedure Laterality Date   ABDOMINAL HYSTERECTOMY     BREAST SURGERY     for dilated duct   CATARACT EXTRACTION Bilateral    GLUTEUS MINIMUS REPAIR Left 02/12/2023   Procedure: LEFT GLUTEUS MEDIUS REPAIR WITH POSSIBLE COLLAGEN PATCH PLACEMENT;  Surgeon: Huel Cote, MD;  Location: [facility];  Service: Orthopedics;  Laterality: Left;   left thyroid lobectomy  2004   MYRINGOTOMY WITH TUBE PLACEMENT Left 12/19/2022   Procedure: MYRINGOTOMY WITH TUBE PLACEMENT;  Surgeon: Laren Boom, DO;  Location: [facility];  Service: ENT;  Laterality: Left;   Patient Active Problem List   Diagnosis Date Noted   S/P lumbar fusion 01/17/2024   Tear of left gluteus medius tendon 02/12/2023   Strain of gluteus medius 02/06/2023   Chronic serous otitis media 12/19/2022   Osteopenia 01/23/2016   Dyslipidemia 01/18/2016   History of esophageal stricture 01/18/2016   Postoperative hypothyroidism 01/18/2016   Esophageal reflux 01/18/2016   Snoring 01/18/2016     Days since surgery: 185  PCP: Adrian Prince, MD   REFERRING PROVIDER: Huel Cote, MD   REFERRING DIAG:  S76.012A (ICD-10-CM) - Tear of left gluteus medius tendon, initial encounter      THERAPY DIAG:  Pain in left hip  Muscle weakness (generalized)  Difficulty walking  Rationale for Evaluation and Treatment: Rehabilitation  ONSET DATE: Days since  surgery: Days since surgery: 185   PROCEDURE: 1. Left hip gluteus medius repair with collagen patch augmentation 2. Left hip trochanteric bursectomy  SUBJECTIVE:   SUBJECTIVE STATEMENT: Patient comes in today reporting that her gluteal area is doing pretty well.  She does not have pain bilaterally when she stands and does too much activity or when she sits too long in the car she reports after about 15 minutes of sitting she has increased tightness in her hips and pain down her bilateral hips she reports it is no worse on surgical side.  She does continue to have intermittent issues with her low back.  She has intermittent pain in her low back which affects her general mobility.   PERTINENT HISTORY: LBP  PAIN: 0/10  pain in hip 0/10 pain in lumbar in sitting  Pain location: low back pain  Pain description: aching  Aggravating factors: moving WB Relieving factors: rest,   PRECAUTIONS: Other: Hip Glute Med Repair  WEIGHT BEARING RESTRICTIONS: Yes    FALLS:  Has patient fallen in last 6 months? No  LIVING ENVIRONMENT: Lives with: lives with their family and lives with their spouse Lives in: House/apartment Stairs: Yes, 2 story Has following equipment at home: Environmental consultant - 4 wheeled and Wheelchair (manual)  OCCUPATION: retired  PLOF: Independent  PATIENT GOALS: Return to exercise and walking w/o pain  NEXT MD VISIT: 2 wk f/u  OBJECTIVE:   DIAGNOSTIC FINDINGS:  IMPRESSION: 1. Moderate tendinosis and probable partial tearing of the left gluteus minimus tendon with associated muscular atrophy. Mild gluteus medius tendinosis, left greater than right. 2. Mild degenerative changes of both hips and sacroiliac joints. No acute osseous findings. 3. Chronic spondylosis in the visualized lower lumbar spine.    PATIENT SURVEYS:  FOTO 25 57 pts MCII 19 pts MCII   COGNITION: Overall cognitive status: Within functional limits for tasks assessed     SENSATION: WFL  POSTURE:  rounded shoulders, increased thoracic kyphosis, and weight shift right  PALPATION: No TTP noted around the incision sight; aquacell bandage in place without signs of infection  LOWER EXTREMITY ROM:  Active ROM Right eval Left eval Left   Hip flexion WFL 90   Hip extension WFL 0   Hip abduction WFL N/A   Hip adduction WFL N/A   Hip internal rotation WFL N/A   Hip external rotation [facility] N/A   Knee flexion WFL 120   Knee extension WFL -5    (Blank rows = not tested)  LOWER EXTREMITY MMT:    MMT Right eval Left eval  Hip flexion 15.5 13.5  Hip extension    Hip abduction 17.6 13.1  Hip adduction    Hip internal rotation    Hip external rotation    Knee flexion    Knee extension 23.3 28.2  Ankle dorsiflexion    Ankle plantarflexion    Ankle inversion    Ankle eversion     (Blank rows = not tested)   FUNCTIONAL TESTS:  FUNCTIONAL TESTS:  5 times sit to stand: single STS performed on single leg on L, UE support needed   GAIT: Distance walked: 61ft Assistive device utilized: able to walk RW Comments:  decreased L stance time, decreased R step length, limited hip extension bilat, step to pattern     TODAY'S TREATMENT:                                                                                                                              DATE:  10/11  Nu-step: 5 minutes level 3 Reviewed entire HEP and how to use. Reviewed Foto  Seated bilateral hip abduction 3 x 10 red Seated long arc quad 2 x 15 red  Row red 2 x 15 Shoulder extension red 2 x 15 Showed how to put in a door.  All exercises performed with at the   9/6 LTRx20    Hip abduction 3x15 red Supine March 2x10     Seated LAQ 3x15 red   Standing heel/toe x20  Standing slow march 2x10   Reviewed HEP and worked on grading exercises.    8/23 Manual: Passive ROM within protocol limits in supine.  Trigger point release to lateral glutes and L sided lumbar paraspinals in R S/L'ing with pillow  b/w knees.    8/15 Manual: Passive ROM within protocol limits in supine.  Trigger point release to lateral glutes and  L sided lumbar paraspinals in R S/L'ing with pillow b/w knees.  Therapeutic Exercise:  SAQ 2x15 2lbs  LAQ 2x15 2lbs  Standing hip abduction left 2x10  Standing hip extension left 2x10   Step up 4 inch and 6 inch x10 each with rail  Sidestepping x 2 laps at rail Mini squats x 10 reps with UE support on rail   8/9 Manual: Passive ROM within protocol limits. Per protocol therapy began ER stretching today. Trigger point release to lateral glutes and IT band and lumbar spine, trigger point release to the lumbar spine    SAQ 2x15 2lbs   LAQ 2x15 2lbs   Standing hip abduction left 2x10  Standing hip extension left 2x10    Step up 4 inch 2x10    7/26 Manual: Passive ROM within protocol limits. Per protocol therapy began ER stretching today. Trigger point release to lateral glutes and IT band and lumbar spine, trigger point release to the lumbar spine   SAQ 2x15   LAQ 2x15 yellow band  Hip abdcution 3x10 yellow  Heel raise x20   Slow march 3x10    7/18 Manual: Passive ROM within protocol limits. Per protocol therapy began ER stretching today. Trigger point release to lateral glutes and IT band, trigger point release to the lumbar spine   Low march 2x10  Hip abduction red 2x10   Standing slow march 2x10  Mini squat 2x10    7/11 Manual: Passive ROM within protocol limits. Per protocol therapy began ER stretching today. Trigger point release to lateral glutes and IT band, trigger point release to the lumbar spine   SAQ 2x15   LAQ 2x15 yellow band  Hip abdcution 3x10 yellow  Heel raise x20   Slow march         PATIENT EDUCATION:  Education details: precautions/protocol, AD usage, gait, cryotherapy, diagnosis, anatomy, exercise form, HEP, and POC.  Person educated: Patient Education method: Explanation, Demonstration, Tactile cues, Verbal  cues, and Handouts Education comprehension: verbalized understanding, returned demonstration, verbal cues required, and tactile cues required   HOME EXERCISE PROGRAM:  Access Code: [email or web address] URL: [email or web address] Date: 02/14/2023 Prepared by: Zebedee Iba  ASSESSMENT:  ASSESSMENT:   CLINICAL IMPRESSION: The patient has made progress. She been packing to move. She does have pain in her back at times. She has been workibng on her exercises as much as she can. She has met her FOTO goal. Today we reviewed HEP. We reviewed how to use her exercises. We reviewed stretching for the lumbar spine and UE core stability exercises. She was given those so she has some variety in her exercises. We will see her for 1-2 more follow ups to finalize HEP.   OBJECTIVE IMPAIRMENTS Abnormal gait, decreased activity tolerance, decreased endurance, decreased mobility, difficulty walking, decreased ROM, decreased strength, hypomobility, increased muscle spasms, impaired flexibility, improper body mechanics, postural dysfunction, and pain.    ACTIVITY LIMITATIONS carrying, lifting, bending, sitting, standing, squatting, sleeping, stairs, transfers, and locomotion level   PARTICIPATION LIMITATIONS: cleaning, laundry, interpersonal relationship, driving, shopping, community activity, occupation, and exercise   PERSONAL FACTORS Age, Fitness, Time since onset of injury/illness/exacerbation, and 3+ comorbidities:  are also affecting patient's functional outcome.    REHAB POTENTIAL: Fair     CLINICAL DECISION MAKING: Stable/uncomplicated   EVALUATION COMPLEXITY: Low     GOALS:     SHORT TERM GOALS: Target date: 03/28/2023        Pt will become independent with HEP in order to demonstrate synthesis of  PT education.     Goal status: Patient independent with basic HEP at this time achieved 7/26   2.  Pt will be able to demonstrate full PROM on L hip in order to demonstrate functional  improvement in LE function for progression to next phase of rehab.     Goal status: Mild limitations in left hip external rotation progressing 7/26   3.  Pt will score at least 19 pt increase on FOTO to demonstrate functional improvement in MCII and pt perceived function.      Goal status: INITIAL     LONG TERM GOALS: Target date: Foto goal achieved 7/26       Pt  will become independent with final HEP in order to demonstrate synthesis of PT education.    Goal status: Continue to expand HEP for   2.  Pt will be able to demonstrate full AROM of the L hip in all planes in order to demonstrate functional improvement in LE function for self-care and house hold duties.   Goal status: Still limitations in external rotation 7/26    3  Pt will score >/= 57 on FOTO to demonstrate improvement in perceived L hip function.           Reach Foto goal achieved   4.  Pt will be able to demonstrate ability to squat to parallel without pain in order to demonstrate functional improvement in LE function for self-care and house hold duties.    Goal status: Squatting improving progressing 7/26   5.  Pt will be able to demonstrate/report ability to walk >30 mins without pain in order to demonstrate functional improvement and tolerance to exercise and community mobility.    Goal status: Community distance ambulation improving 7/26     PLAN: PT FREQUENCY: 1-2x/week   PT DURATION: 12 weeks   PLANNED INTERVENTIONS:  Continue to progress weight bearing and general strengthening per protocol     PHYSICAL THERAPY DISCHARGE SUMMARY  Visits from Start of Care:20  Current functional level related to goals / functional outcomes: Improved strength   Remaining deficits: Continues to have pain    Education / Equipment: HEP    Patient agrees to discharge. Patient goals were met. Patient is being discharged due to maximized rehab potential.    Lorayne Bender PT DPT 02/06/24 10:00 AM

## 2024-02-13 ENCOUNTER — Ambulatory Visit (HOSPITAL_BASED_OUTPATIENT_CLINIC_OR_DEPARTMENT_OTHER): Attending: Student | Admitting: Physical Therapy

## 2024-02-13 DIAGNOSIS — Z961 Presence of intraocular lens: Secondary | ICD-10-CM | POA: Diagnosis not present

## 2024-02-13 DIAGNOSIS — M5459 Other low back pain: Secondary | ICD-10-CM | POA: Insufficient documentation

## 2024-02-13 DIAGNOSIS — M6281 Muscle weakness (generalized): Secondary | ICD-10-CM | POA: Insufficient documentation

## 2024-02-13 DIAGNOSIS — H353131 Nonexudative age-related macular degeneration, bilateral, early dry stage: Secondary | ICD-10-CM | POA: Diagnosis not present

## 2024-02-13 NOTE — Therapy (Signed)
 OUTPATIENT PHYSICAL THERAPY THORACOLUMBAR EVALUATION   Patient Name: Margaret Graham MRN: [id number] DOB:1945-11-04, 78 y.o., female Today's Date: 02/14/2024  END OF SESSION:  PT End of Session - 02/13/24 0824     Visit Number 1    Number of Visits 20    Date for PT Re-Evaluation 05/07/24    Authorization Type MCR A and B    PT Start Time 0805    PT Stop Time 0855    PT Time Calculation (min) 50 min    Activity Tolerance Patient tolerated treatment well    Behavior During Therapy WFL for tasks assessed/performed             Past Medical History:  Diagnosis Date   Arthritis    GERD (gastroesophageal reflux disease)    Heart murmur    Hyperlipidemia    Hypothyroidism    Internal hemorrhoid    Osteopenia    Schatzki's ring    Sleep apnea    does not use a cpap machine   Thyroid  disease    Thyroid  nodule    right 1.7 cm   Tubular adenoma    Past Surgical History:  Procedure Laterality Date   ABDOMINAL HYSTERECTOMY     BREAST SURGERY     for dilated duct   CATARACT EXTRACTION Bilateral    GLUTEUS MINIMUS REPAIR Left 02/12/2023   Procedure: LEFT GLUTEUS MEDIUS REPAIR WITH POSSIBLE COLLAGEN PATCH PLACEMENT;  Surgeon: Wilhelmenia Harada, MD;  Location: [facility];  Service: Orthopedics;  Laterality: Left;   left thyroid  lobectomy  2004   MYRINGOTOMY WITH TUBE PLACEMENT Left 12/19/2022   Procedure: MYRINGOTOMY WITH TUBE PLACEMENT;  Surgeon: Daleen Dubs, DO;  Location: [facility];  Service: ENT;  Laterality: Left;   Patient Active Problem List   Diagnosis Date Noted   S/P lumbar fusion 01/17/2024   Tear of left gluteus medius tendon 02/12/2023   Strain of gluteus medius 02/06/2023   Chronic serous otitis media 12/19/2022   Osteopenia 01/23/2016   Dyslipidemia 01/18/2016   History of esophageal stricture 01/18/2016   Postoperative hypothyroidism 01/18/2016   Esophageal reflux 01/18/2016   Snoring 01/18/2016     REFERRING PROVIDER:  Jeannette Mills, NP  Surgeon:  Joaquin Mulberry, MD  REFERRING DIAG: M43.16 (ICD-10-CM) - Spondylolisthesis, lumbar region  Rationale for Evaluation and Treatment: Rehabilitation  THERAPY DIAG:  Other low back pain  Muscle weakness (generalized)  ONSET DATE: DOS 01/17/2024  SUBJECTIVE:  SUBJECTIVE STATEMENT: Pt states she has had back pain for years with no specific MOI.  Pt underwent decompressive lumbar laminectomy, hemi facetectomy and foraminotomies L3-4 L4-5, Posterior lumbar interbody fusion L3-4 L4-5, Posterior fixation L3-L5, and Intertransverse arthrodesis L3-L5 on 01/17/24.  Pt saw MD on 01/30/24.  Pt did not have x rays though is having them next follow up.  Pt states she was informed she could drive and not to lift anything heavy.   Pt has been ambulating with SPC since hip surgery last year though doesn't use it as much in the house.  Pt states she has been performing a walking program.  She walks approx 6 minutes at a time.  Pt has increased pain with ambulating increased distance such as a grocery store.  Pt is unable to perform household chores.  Pt is limited with standing duration.    PERTINENT HISTORY:  -decompressive lumbar laminectomy, hemi facetectomy and foraminotomies L3-4 L4-5, Posterior lumbar interbody fusion L3-4 L4-5, Posterior fixation L3-L5, and Intertransverse arthrodesis L3-L5 on 01/17/24.  B / L / T restrictions though pt states MD stated it was ok for her to lean over and pick something up.   -L hip Gluteus medius repair and trochanteric bursectomy on 02/12/2023 which she may need to have it redone.  Pt still has L hip pain. -arthritis  PAIN:  NPRS:  0/10 current, 8-9/10 worst Worst pain is when she walks too far.  Location:  central lumbar, bilat lumbar flanks L >  R, bilat anterior hip and proximal thigh. Easing factors:  sitting, lying  PRECAUTIONS: Back   WEIGHT BEARING RESTRICTIONS:  lifting restrictions  FALLS:  Has patient fallen in last 6 months?   LIVING ENVIRONMENT: Lives with: lives with their spouse   OCCUPATION: Pt is retired  PLOF: Independent.   Pt has been using a SPC since hip surgery April 2024.  PATIENT GOALS: to be able to walk as long as I want to  NEXT MD VISIT: Pt thinks 03/03/24  OBJECTIVE:  Note: Objective measures were completed at Evaluation unless otherwise noted.  DIAGNOSTIC FINDINGS:  Pt is post op.  She had a MRI prior to surgery.  PATIENT SURVEYS:  Modified Oswestry 36%   COGNITION: Overall cognitive status: Within functional limits for tasks assessed     SENSATION: L2-S2 WFL to LT bilat   LUMBAR ROM:   Not tested due to contraindicated for surgical protocol and post op healing   LOWER EXTREMITY MMT:   Strength not tested due to healing constraints and post op restrictions   GAIT: Level of assistance: Independent Comments:  Pt ambulates with a heel to toe gait with SPC on R.  Pt doesn't perform arm swing on L  TREATMENT:                                                                                                                               PT educated pt in palpation and  contraction of TrA.  Pt performed TrA contractions.   Pt performed  glute sets in supine x 10 reps with 5 sec hold and ankle pumps 2x10. PT instructed pt in Log rolling and pt performed in the clinic.  Pt received a HEP handout and was educated in correct form and appropriate frequency.  PT instructed pt she should not have pain with HEP.  PATIENT EDUCATION:  Education details:  PT educated pt concerning post op and protocol limitations and restrictions including no bending, lifting, and twisting.  Pt states MD informed her it was ok to lean over and pick something up.  PT educated pt concerning importance of walking  program.  Bed mobility, HEP, exercise form, rationale of interventions, relevant anatomy, and POC. Person educated: Patient Education method: Explanation, Demonstration, Tactile cues, Verbal cues, and Handouts Education comprehension: verbalized understanding, returned demonstration, verbal cues required, tactile cues required, and needs further education  HOME EXERCISE PROGRAM: Access Code: [email or web address] URL: [email or web address] Date: 02/13/2024 Prepared by: Marnie Siren  Exercises - Log Roll  - Supine Gluteal Sets  - 2 x daily - 7 x weekly - 2 sets - 10 reps - 5 secons hold - Supine Transversus Abdominis Bracing - Hands on Stomach  - 2 x daily - 7 x weekly - 2 sets - 10 reps - Supine Single Leg Ankle Pumps  - 2-3 x daily - 7 x weekly - 2 sets - 10 reps  ASSESSMENT:  CLINICAL IMPRESSION: Patient is a 78 y.o. female 3 weeks and 6 days s/p decompressive lumbar laminectomy, hemi facetectomy and foraminotomies L3-4 L4-5, Posterior lumbar interbody fusion L3-4 L4-5, Posterior fixation L3-L5, and Intertransverse arthrodesis L3-L5.  Pt has been performing a walking program.  Pt has increased pain with ambulating increased distance such as a grocery store.   Pt is is limited with standing duration and unable to perform household chores.  Pt reports having low back pain and anterior hip/proximal anterior thigh pain.  She has expected muscle weakness.  She received a HEP handout and was educated in correct positioning and form and appropriate frequency.  PT instructed pt in log rolling and she demonstrated good form with log rolling.  Pt responded well to Rx reporting no pain after treatment.  Pt should benefit form skilled PT per protocol to address impairments and improve overall function.    OBJECTIVE IMPAIRMENTS: decreased activity tolerance, decreased endurance, decreased mobility, difficulty walking, decreased strength, and pain.   ACTIVITY LIMITATIONS: carrying, lifting, bending,  standing, squatting, stairs, transfers, bed mobility, and locomotion level  PARTICIPATION LIMITATIONS: meal prep, cleaning, laundry, shopping, and community activity  PERSONAL FACTORS: Time since onset of injury/illness/exacerbation and 1 comorbidity: L hip Gluteus medius repair and continued L hi pain  are also affecting patient's functional outcome.   REHAB POTENTIAL: Good  CLINICAL DECISION MAKING: Stable/uncomplicated  EVALUATION COMPLEXITY: Low   GOALS:  SHORT TERM GOALS: Target date:  03/12/2024    Pt will be independent and compliant with HEP for improved pain, strength, function, and mobility.   Baseline: Goal status: INITIAL  2.  Pt will report at least a 25% improvement in ambulation.  Baseline:  Goal status: INITIAL  3.  Pt will progress with walking program without adverse effects. Baseline:  Goal status: INITIAL  4.  Pt will progress with exercises per protocol without adverse effects for improved core and LE strength in order to for improved tolerance and ease with daily activities and functional mobility.  Baseline:  Goal status: INITIAL Target  date:  04/09/2024   LONG TERM GOALS: Target date: 05/07/2024  Pt will report she is able to ambulate extended community distance without increased back pain.  Baseline:  Goal status: INITIAL  2.  Pt will report she is able to perform household chores with expected limitations without significant pain and difficulty.  Baseline:  Goal status: INITIAL  3.  Pt will report good standing tolerance with her ADL's, IADL's, and community mobility.  Baseline:  Goal status: INITIAL  4.  Pt will demonstrate at least a 12% improvement in modified ODI for clinically significant improvement in self perceived disability.  Baseline:  Goal status: INITIAL  5.  Pt will be independent with advanced HEP in order to have a consistent exercise routine for improved core and LE strength for good tolerance and performance of ADLs/IADLs  and functional mobility.   Baseline:  Goal status: INITIAL    PLAN:  PT FREQUENCY: 1x/wk x 3-4 weeks and 2x/wk afterwards  PT DURATION: 12 weeks  PLANNED INTERVENTIONS: [id number]- PT Re-evaluation, 97750- Physical Performance Testing, 97110-Therapeutic exercises, 97530- Therapeutic activity, 97112- Neuromuscular re-education, 97535- Self Care, [id number]- Manual therapy, [phone]- Gait training, [phone]- Aquatic Therapy, [phone]- Electrical stimulation (unattended), Patient/Family education, Balance training, Stair training, Taping, Dry Needling, Joint mobilization, Scar mobilization, DME instructions, Cryotherapy, and Moist heat.  PLAN FOR NEXT SESSION: Cont per lumbar fusion protocol.    Trina Fujita III PT, DPT 02/14/24 9:46 AM

## 2024-02-14 ENCOUNTER — Encounter (HOSPITAL_BASED_OUTPATIENT_CLINIC_OR_DEPARTMENT_OTHER): Payer: Self-pay | Admitting: Physical Therapy

## 2024-02-20 ENCOUNTER — Ambulatory Visit (HOSPITAL_BASED_OUTPATIENT_CLINIC_OR_DEPARTMENT_OTHER): Payer: Self-pay | Admitting: Physical Therapy

## 2024-02-20 ENCOUNTER — Encounter (HOSPITAL_BASED_OUTPATIENT_CLINIC_OR_DEPARTMENT_OTHER): Payer: Self-pay

## 2024-02-25 ENCOUNTER — Ambulatory Visit (INDEPENDENT_AMBULATORY_CARE_PROVIDER_SITE_OTHER): Admitting: Physician Assistant

## 2024-02-25 ENCOUNTER — Encounter: Payer: Self-pay | Admitting: Physician Assistant

## 2024-02-25 VITALS — BP 110/60 | HR 66 | Ht 65.0 in | Wt 155.0 lb

## 2024-02-25 DIAGNOSIS — K219 Gastro-esophageal reflux disease without esophagitis: Secondary | ICD-10-CM | POA: Diagnosis not present

## 2024-02-25 MED ORDER — FAMOTIDINE 40 MG PO TABS: 40.0000 mg | ORAL_TABLET | Freq: Every day | ORAL | 3 refills | Status: AC

## 2024-02-25 NOTE — Progress Notes (Signed)
 Chief Complaint: Follow-up GERD and medication refill  HPI:    Margaret Graham is a 77 year old female with a past medical history as listed below including reflux and multiple others, known to Dr. Dominic Friendly, who presents to clinic today for a follow-up of her reflux and medication refill.      04/15/2015 EGD in [place] with diverticulum in the second part of the duodenum, esophageal hiatal hernia, ring in the gastroesophageal junction dilated and grade a esophagitis.  Stayed on Pepcid  20 mg daily at that time.    12/03/2017 colonoscopy with diverticulosis in the left colon and one 2 mm polyp in the rectum, exam otherwise normal.  Constipation appear to be combination of diverticulosis and pelvic floor laxity seen on CT scan.  Pathology showed tubular adenoma.    01/09/2024 CBC platelets minimally elevated otherwise normal.    02/04/24 patient called for refills of Pepcid .  Told she needed an office visit.    Today, patient explains that she has been on Pepcid  for over 10 years now, at 1 point it was increased from 20 to 40 mg and she has been staying on that nightly.  She has no issues with breakthrough heartburn or reflux.  Denies any other acute GI complaints or concerns.    Patient will be moving down to [facility] to be closer to her daughter.  She would likely not be seen here again.    Denies fever, chills, weight loss or abdominal pain.  Past Medical History:  Diagnosis Date   Arthritis    GERD (gastroesophageal reflux disease)    Heart murmur    Hyperlipidemia    Hypothyroidism    Internal hemorrhoid    Osteopenia    Schatzki's ring    Sleep apnea    does not use a cpap machine   Thyroid  disease    Thyroid  nodule    right 1.7 cm   Tubular adenoma     Past Surgical History:  Procedure Laterality Date   ABDOMINAL HYSTERECTOMY     BREAST SURGERY     for dilated duct   CATARACT EXTRACTION Bilateral    GLUTEUS MINIMUS REPAIR Left 02/12/2023   Procedure: LEFT GLUTEUS MEDIUS REPAIR WITH  POSSIBLE COLLAGEN PATCH PLACEMENT;  Surgeon: Wilhelmenia Harada, MD;  Location: [facility];  Service: Orthopedics;  Laterality: Left;   left thyroid  lobectomy  2004   MYRINGOTOMY WITH TUBE PLACEMENT Left 12/19/2022   Procedure: MYRINGOTOMY WITH TUBE PLACEMENT;  Surgeon: Daleen Dubs, DO;  Location: [facility];  Service: ENT;  Laterality: Left;    Current Outpatient Medications  Medication Sig Dispense Refill   alendronate (FOSAMAX) 70 MG tablet Take 70 mg by mouth every Wednesday. Take with a full glass of water on an empty stomach.     Ascorbic Acid  (VITAMIN C ) 1000 MG tablet Take 2,000 mg by mouth daily.     ASPIRIN  81 PO Take 1 tablet by mouth daily.     Calcium Carb-Cholecalciferol  (CALCIUM 600 + D PO) Take 1 tablet by mouth 2 (two) times daily.     Carboxymethylcellulose Sodium (REFRESH PLUS OP) Place 1 drop into both eyes 2 (two) times daily. PF     chlorhexidine  (HIBICLENS ) 4 % external liquid Apply 15 mLs (1 Application total) topically as directed for 30 doses. Use as directed daily for 5 days every other week for 6 weeks. 946 mL 1   ezetimibe  (ZETIA ) 10 MG tablet Take 10 mg by mouth daily.     famotidine  (  PEPCID ) 40 MG tablet Take 1 tablet (40 mg total) by mouth at bedtime. 90 tablet 0   levothyroxine  (SYNTHROID , LEVOTHROID) 100 MCG tablet Take 1 tablet (100 mcg total) by mouth every morning. 30 tablet 3   Multiple Vitamin (MULTIVITAMIN) capsule Take 1 capsule by mouth daily.     Multiple Vitamins-Minerals (PRESERVISION AREDS 2) CAPS Take 1 tablet by mouth 2 (two) times daily.     oxyCODONE  (ROXICODONE ) 5 MG immediate release tablet Take 1 tablet (5 mg total) by mouth every 6 (six) hours as needed for severe pain (pain score 7-10). 30 tablet 0   Phenyleph-Doxylamine-DM-APAP (NYQUIL SEVERE COLD/FLU) 5-6.25-10-325 MG/15ML LIQD Take 30 mLs by mouth at bedtime.     simvastatin  (ZOCOR ) 20 MG tablet Take 1 tablet (20 mg total) by mouth daily. 30 tablet 0   tiZANidine   (ZANAFLEX ) 2 MG tablet Take 1 tablet (2 mg total) by mouth every 8 (eight) hours as needed for muscle spasms. 30 tablet 1   Current Facility-Administered Medications  Medication Dose Route Frequency Provider Last Rate Last Admin   0.9 %  sodium chloride  infusion  500 mL Intravenous Once Danis, Henry L III, MD        Allergies as of 02/25/2024 - Review Complete 02/14/2024  Allergen Reaction Noted   Declomycin [demeclocycline]  01/18/2016    Family History  Problem Relation Age of Onset   Transient ischemic attack Mother    Arthritis Mother    Transient ischemic attack Father    Arthritis Father    Hyperlipidemia Father    Thyroid  disease Sister    Diabetes Son    Asthma Son    Heart disease Paternal Uncle    Colon cancer Neg Hx    Esophageal cancer Neg Hx    Rectal cancer Neg Hx    Sleep apnea Neg Hx     Social History   Socioeconomic History   Marital status: Married    Spouse name: Not on file   Number of children: 2   Years of education: Not on file   Highest education level: Not on file  Occupational History   Not on file  Tobacco Use   Smoking status: Never   Smokeless tobacco: Never  Vaping Use   Vaping status: Never Used  Substance and Sexual Activity   Alcohol  use: Yes    Alcohol /week: 7.0 standard drinks of alcohol     Types: 7 Glasses of wine per week    Comment: 1 glass a day typically   Drug use: No   Sexual activity: Not Currently    Partners: Male  Other Topics Concern   Not on file  Social History Narrative   Retired from [employer].   Moved to [facility] 07/2015.   From [place] .   Moving to [place], [place]   Social Drivers of Health   Financial Resource Strain: Not on file  Food Insecurity: Not on file  Transportation Needs: Not on file  Physical Activity: Not on file  Stress: Not on file  Social Connections: Not on file  Intimate Partner Violence: Not on file    Review of Systems:    Constitutional: No weight loss, fever or  chills Cardiovascular: No chest pain   Respiratory: No SOB  Gastrointestinal: See HPI and otherwise negative   Physical Exam:  Vital signs: BP 110/60   Pulse 66   Ht 5\' 5"  (1.651 m)   Wt 155 lb (70.3 kg)   BMI 25.79 kg/m    Constitutional:  Pleasant elderly Caucasian female appears to be in NAD, Well developed, Well nourished, alert and cooperative Respiratory: Respirations even and unlabored. Lungs clear to auscultation bilaterally.   No wheezes, crackles, or rhonchi.  Cardiovascular: Normal S1, S2. No MRG. Regular rate and rhythm. No peripheral edema, cyanosis or pallor.  Gastrointestinal:  Soft, nondistended, nontender. No rebound or guarding. Normal bowel sounds. No appreciable masses or hepatomegaly. Rectal:  Not performed.  Msk:  Symmetrical without gross deformities. Without edema, no deformity or joint abnormality. +ambulating with cane Psychiatric: Demonstrates good judgement and reason without abnormal affect or behaviors.  RELEVANT LABS AND IMAGING: CBC    Component Value Date/Time   WBC 8.9 01/09/2024 1000   RBC 4.56 01/09/2024 1000   HGB 13.4 01/09/2024 1000   HCT 41.8 01/09/2024 1000   PLT 406 (H) 01/09/2024 1000   MCV 91.7 01/09/2024 1000   MCH 29.4 01/09/2024 1000   MCHC 32.1 01/09/2024 1000   RDW 13.3 01/09/2024 1000   LYMPHSABS 1.6 05/24/2021 1145   MONOABS 1.1 (H) 05/24/2021 1145   EOSABS 0.1 05/24/2021 1145   BASOSABS 0.0 05/24/2021 1145    CMP     Component Value Date/Time   NA 137 05/24/2021 1145   K 3.9 05/24/2021 1145   CL 102 05/24/2021 1145   CO2 26 05/24/2021 1145   GLUCOSE 85 05/24/2021 1145   BUN 22 05/24/2021 1145   CREATININE 0.79 05/24/2021 1145   CREATININE 0.73 05/24/2016 0922   CALCIUM 9.0 05/24/2021 1145   PROT 7.1 05/24/2021 1145   ALBUMIN  4.2 05/24/2021 1145   AST 21 05/24/2021 1145   ALT 17 05/24/2021 1145   ALKPHOS 56 05/24/2021 1145   BILITOT 0.5 05/24/2021 1145   GFRNONAA >60 05/24/2021 1145   GFRNONAA 84  05/24/2016 0922   GFRAA >89 05/24/2016 0922    Assessment: 1.  GERD: Well-controlled on Pepcid  40 mg nightly  Plan: 1.  Refill Pepcid  40 mg nightly #90 with 3 refills.  Patient can have another year of refills when she runs out. 2.  Patient will likely not be seen here again as she is moving to the [facility] area to be closer to her daughter 3.  Follow-up as needed otherwise  Reginal Capra, PA-C [facility] 02/25/2024, 2:47 PM  Cc: Rosslyn Coons, MD

## 2024-02-25 NOTE — Addendum Note (Signed)
 Addended by: Cain Castillo on: 02/25/2024 03:21 PM   Modules accepted: Orders

## 2024-02-27 ENCOUNTER — Ambulatory Visit (HOSPITAL_BASED_OUTPATIENT_CLINIC_OR_DEPARTMENT_OTHER): Attending: Student | Admitting: Physical Therapy

## 2024-02-27 ENCOUNTER — Encounter (HOSPITAL_BASED_OUTPATIENT_CLINIC_OR_DEPARTMENT_OTHER): Payer: Self-pay | Admitting: Physical Therapy

## 2024-02-27 DIAGNOSIS — M25552 Pain in left hip: Secondary | ICD-10-CM | POA: Insufficient documentation

## 2024-02-27 DIAGNOSIS — R262 Difficulty in walking, not elsewhere classified: Secondary | ICD-10-CM | POA: Diagnosis not present

## 2024-02-27 DIAGNOSIS — M5459 Other low back pain: Secondary | ICD-10-CM | POA: Insufficient documentation

## 2024-02-27 DIAGNOSIS — M6281 Muscle weakness (generalized): Secondary | ICD-10-CM | POA: Insufficient documentation

## 2024-02-27 NOTE — Therapy (Signed)
 OUTPATIENT PHYSICAL THERAPY THORACOLUMBAR EVALUATION   Patient Name: Margaret Graham MRN: [id number] DOB:May 24, 1946, 78 y.o., female Today's Date: 02/27/2024  END OF SESSION:  PT End of Session - 02/27/24 1350     Visit Number 2    Number of Visits 20    Date for PT Re-Evaluation 05/07/24    Authorization Type MCR A and B    PT Start Time 1100    PT Stop Time 1143    PT Time Calculation (min) 43 min    Activity Tolerance Patient tolerated treatment well    Behavior During Therapy WFL for tasks assessed/performed              Past Medical History:  Diagnosis Date   Arthritis    GERD (gastroesophageal reflux disease)    Heart murmur    Hyperlipidemia    Hypothyroidism    Internal hemorrhoid    Osteopenia    Schatzki's ring    Sleep apnea    does not use a cpap machine   Thyroid  disease    Thyroid  nodule    right 1.7 cm   Tubular adenoma    Past Surgical History:  Procedure Laterality Date   ABDOMINAL HYSTERECTOMY     BREAST SURGERY     for dilated duct   CATARACT EXTRACTION Bilateral    GLUTEUS MINIMUS REPAIR Left 02/12/2023   Procedure: LEFT GLUTEUS MEDIUS REPAIR WITH POSSIBLE COLLAGEN PATCH PLACEMENT;  Surgeon: Wilhelmenia Harada, MD;  Location: [facility];  Service: Orthopedics;  Laterality: Left;   left thyroid  lobectomy  2004   MYRINGOTOMY WITH TUBE PLACEMENT Left 12/19/2022   Procedure: MYRINGOTOMY WITH TUBE PLACEMENT;  Surgeon: Daleen Dubs, DO;  Location: [facility];  Service: ENT;  Laterality: Left;   Patient Active Problem List   Diagnosis Date Noted   S/P lumbar fusion 01/17/2024   Tear of left gluteus medius tendon 02/12/2023   Strain of gluteus medius 02/06/2023   Chronic serous otitis media 12/19/2022   Osteopenia 01/23/2016   Dyslipidemia 01/18/2016   History of esophageal stricture 01/18/2016   Postoperative hypothyroidism 01/18/2016   Esophageal reflux 01/18/2016   Snoring 01/18/2016     REFERRING PROVIDER:  Jeannette Mills, NP  Surgeon:  Joaquin Mulberry, MD  REFERRING DIAG: M43.16 (ICD-10-CM) - Spondylolisthesis, lumbar region  Rationale for Evaluation and Treatment: Rehabilitation  THERAPY DIAG:  Other low back pain  Muscle weakness (generalized)  Pain in left hip  Difficulty walking  ONSET DATE: DOS 01/17/2024  SUBJECTIVE:  SUBJECTIVE STATEMENT: The patient reports pain down into her hips. She has been working on her exercises from evaluating therapist. She feels like she is going very slow. She was advised it is normal.    Eval:Pt states she has had back pain for years with no specific MOI.  Pt underwent decompressive lumbar laminectomy, hemi facetectomy and foraminotomies L3-4 L4-5, Posterior lumbar interbody fusion L3-4 L4-5, Posterior fixation L3-L5, and Intertransverse arthrodesis L3-L5 on 01/17/24.  Pt saw MD on 01/30/24.  Pt did not have x rays though is having them next follow up.  Pt states she was informed she could drive and not to lift anything heavy.   Pt has been ambulating with SPC since hip surgery last year though doesn't use it as much in the house.  Pt states she has been performing a walking program.  She walks approx 6 minutes at a time.  Pt has increased pain with ambulating increased distance such as a grocery store.  Pt is unable to perform household chores.  Pt is limited with standing duration.    PERTINENT HISTORY:  -decompressive lumbar laminectomy, hemi facetectomy and foraminotomies L3-4 L4-5, Posterior lumbar interbody fusion L3-4 L4-5, Posterior fixation L3-L5, and Intertransverse arthrodesis L3-L5 on 01/17/24.  B / L / T restrictions though pt states MD stated it was ok for her to lean over and pick something up.   -L hip Gluteus medius repair and trochanteric  bursectomy on 02/12/2023 which she may need to have it redone.  Pt still has L hip pain. -arthritis  PAIN:  NPRS:  0/10 current, 8-9/10 worst Worst pain is when she walks too far.  Location:  central lumbar, bilat lumbar flanks L > R, bilat anterior hip and proximal thigh. Easing factors:  sitting, lying  PRECAUTIONS: Back   WEIGHT BEARING RESTRICTIONS:  lifting restrictions  FALLS:  Has patient fallen in last 6 months?   LIVING ENVIRONMENT: Lives with: lives with their spouse   OCCUPATION: Pt is retired  PLOF: Independent.   Pt has been using a SPC since hip surgery April 2024.  PATIENT GOALS: to be able to walk as long as I want to  NEXT MD VISIT: Pt thinks 03/03/24  OBJECTIVE:  Note: Objective measures were completed at Evaluation unless otherwise noted.  DIAGNOSTIC FINDINGS:  Pt is post op.  She had a MRI prior to surgery.  PATIENT SURVEYS:  Modified Oswestry 36%   COGNITION: Overall cognitive status: Within functional limits for tasks assessed     SENSATION: L2-S2 WFL to LT bilat   LUMBAR ROM:   Not tested due to contraindicated for surgical protocol and post op healing   LOWER EXTREMITY MMT:   Strength not tested due to healing constraints and post op restrictions   GAIT: Level of assistance: Independent Comments:  Pt ambulates with a heel to toe gait with SPC on R.  Pt doesn't perform arm swing on L  TREATMENT:  Manual:   Trigger point release to bilateral paraspinals and bilateral gluteals.   There-ex: LTR 2x10 with cuing  Ball row fwd and lateral 2x10 each   Neuro-re-ed: Ball press 3x10 with core breathing  Bilateral ER with core breathing 3x10 yellow    Eval: PT educated pt in palpation and contraction of TrA.  Pt performed TrA contractions.   Pt performed  glute sets in supine x 10 reps with 5 sec hold and ankle  pumps 2x10. PT instructed pt in Log rolling and pt performed in the clinic.  Pt received a HEP handout and was educated in correct form and appropriate frequency.  PT instructed pt she should not have pain with HEP.  PATIENT EDUCATION:  Education details:  PT educated pt concerning post op and protocol limitations and restrictions including no bending, lifting, and twisting.  Pt states MD informed her it was ok to lean over and pick something up.  PT educated pt concerning importance of walking program.  Bed mobility, HEP, exercise form, rationale of interventions, relevant anatomy, and POC. Person educated: Patient Education method: Explanation, Demonstration, Tactile cues, Verbal cues, and Handouts Education comprehension: verbalized understanding, returned demonstration, verbal cues required, tactile cues required, and needs further education  HOME EXERCISE PROGRAM: Access Code: [email or web address] URL: [email or web address] Date: 02/13/2024 Prepared by: Marnie Siren  Exercises - Log Roll  - Supine Gluteal Sets  - 2 x daily - 7 x weekly - 2 sets - 10 reps - 5 secons hold - Supine Transversus Abdominis Bracing - Hands on Stomach  - 2 x daily - 7 x weekly - 2 sets - 10 reps - Supine Single Leg Ankle Pumps  - 2-3 x daily - 7 x weekly - 2 sets - 10 reps  ASSESSMENT:  CLINICAL IMPRESSION: The patient tolerated treatment well. She had significant tightness in her back, but tolerated soft tissue mobilization well. We expanded her HEP. We worked on postural exercises and breathing exercises. She had no significant increase in pain. She was advised to be patient.     Patient is a 78 y.o. female 3 weeks and 6 days s/p decompressive lumbar laminectomy, hemi facetectomy and foraminotomies L3-4 L4-5, Posterior lumbar interbody fusion L3-4 L4-5, Posterior fixation L3-L5, and Intertransverse arthrodesis L3-L5.  Pt has been performing a walking program.  Pt has increased pain with ambulating  increased distance such as a grocery store.   Pt is is limited with standing duration and unable to perform household chores.  Pt reports having low back pain and anterior hip/proximal anterior thigh pain.  She has expected muscle weakness.  She received a HEP handout and was educated in correct positioning and form and appropriate frequency.  PT instructed pt in log rolling and she demonstrated good form with log rolling.  Pt responded well to Rx reporting no pain after treatment.  Pt should benefit form skilled PT per protocol to address impairments and improve overall function.    OBJECTIVE IMPAIRMENTS: decreased activity tolerance, decreased endurance, decreased mobility, difficulty walking, decreased strength, and pain.   ACTIVITY LIMITATIONS: carrying, lifting, bending, standing, squatting, stairs, transfers, bed mobility, and locomotion level  PARTICIPATION LIMITATIONS: meal prep, cleaning, laundry, shopping, and community activity  PERSONAL FACTORS: Time since onset of injury/illness/exacerbation and 1 comorbidity: L hip Gluteus medius repair and continued L hi pain  are also affecting patient's functional outcome.   REHAB POTENTIAL: Good  CLINICAL DECISION MAKING: Stable/uncomplicated  EVALUATION COMPLEXITY: Low   GOALS:  SHORT TERM GOALS: Target date:  03/12/2024    Pt will be independent and compliant with HEP for improved pain, strength, function, and mobility.   Baseline: Goal status: INITIAL  2.  Pt will report at least a 25% improvement in ambulation.  Baseline:  Goal status: INITIAL  3.  Pt will progress with walking program without adverse effects. Baseline:  Goal status: INITIAL  4.  Pt will progress with exercises per protocol without adverse effects for improved core and LE strength in order to for improved tolerance and ease with daily activities and functional mobility.  Baseline:  Goal status: INITIAL Target date:  04/09/2024   LONG TERM GOALS: Target  date: 05/07/2024  Pt will report she is able to ambulate extended community distance without increased back pain.  Baseline:  Goal status: INITIAL  2.  Pt will report she is able to perform household chores with expected limitations without significant pain and difficulty.  Baseline:  Goal status: INITIAL  3.  Pt will report good standing tolerance with her ADL's, IADL's, and community mobility.  Baseline:  Goal status: INITIAL  4.  Pt will demonstrate at least a 12% improvement in modified ODI for clinically significant improvement in self perceived disability.  Baseline:  Goal status: INITIAL  5.  Pt will be independent with advanced HEP in order to have a consistent exercise routine for improved core and LE strength for good tolerance and performance of ADLs/IADLs and functional mobility.   Baseline:  Goal status: INITIAL    PLAN:  PT FREQUENCY: 1x/wk x 3-4 weeks and 2x/wk afterwards  PT DURATION: 12 weeks  PLANNED INTERVENTIONS: [id number]- PT Re-evaluation, 97750- Physical Performance Testing, 97110-Therapeutic exercises, 97530- Therapeutic activity, 97112- Neuromuscular re-education, 97535- Self Care, [id number]- Manual therapy, [phone]- Gait training, [phone]- Aquatic Therapy, [phone]- Electrical stimulation (unattended), Patient/Family education, Balance training, Stair training, Taping, Dry Needling, Joint mobilization, Scar mobilization, DME instructions, Cryotherapy, and Moist heat.  PLAN FOR NEXT SESSION: Cont per lumbar fusion protocol.    Trina Fujita III PT, DPT 02/27/24 1:51 PM

## 2024-02-27 NOTE — Progress Notes (Signed)
 ____________________________________________________________  Attending physician addendum:  Thank you for sending this case to me. I have reviewed the entire note and agree with the plan.   Amada Jupiter, MD  ____________________________________________________________

## 2024-03-03 DIAGNOSIS — M4316 Spondylolisthesis, lumbar region: Secondary | ICD-10-CM | POA: Diagnosis not present

## 2024-03-03 DIAGNOSIS — M7061 Trochanteric bursitis, right hip: Secondary | ICD-10-CM | POA: Diagnosis not present

## 2024-03-05 ENCOUNTER — Encounter (HOSPITAL_BASED_OUTPATIENT_CLINIC_OR_DEPARTMENT_OTHER): Admitting: Physical Therapy

## 2024-03-12 ENCOUNTER — Ambulatory Visit (HOSPITAL_BASED_OUTPATIENT_CLINIC_OR_DEPARTMENT_OTHER): Admitting: Physical Therapy

## 2024-03-12 ENCOUNTER — Encounter (HOSPITAL_BASED_OUTPATIENT_CLINIC_OR_DEPARTMENT_OTHER): Payer: Self-pay | Admitting: Physical Therapy

## 2024-03-12 DIAGNOSIS — R262 Difficulty in walking, not elsewhere classified: Secondary | ICD-10-CM | POA: Diagnosis not present

## 2024-03-12 DIAGNOSIS — M5459 Other low back pain: Secondary | ICD-10-CM

## 2024-03-12 DIAGNOSIS — M6281 Muscle weakness (generalized): Secondary | ICD-10-CM | POA: Diagnosis not present

## 2024-03-12 DIAGNOSIS — M25552 Pain in left hip: Secondary | ICD-10-CM | POA: Diagnosis not present

## 2024-03-12 NOTE — Therapy (Signed)
 OUTPATIENT PHYSICAL THERAPY THORACOLUMBAR EVALUATION   Patient Name: Margaret Graham MRN: [id number] DOB:Dec 25, 1945, 78 y.o., female Today's Date: 03/13/2024  END OF SESSION:  PT End of Session - 03/12/24 1353     Visit Number 3    Number of Visits 20    Date for PT Re-Evaluation 05/07/24    Authorization Type MCR A and B    PT Start Time 1315    PT Stop Time 1357    PT Time Calculation (min) 42 min    Activity Tolerance Patient tolerated treatment well    Behavior During Therapy WFL for tasks assessed/performed              Past Medical History:  Diagnosis Date   Arthritis    GERD (gastroesophageal reflux disease)    Heart murmur    Hyperlipidemia    Hypothyroidism    Internal hemorrhoid    Osteopenia    Schatzki's ring    Sleep apnea    does not use a cpap machine   Thyroid  disease    Thyroid  nodule    right 1.7 cm   Tubular adenoma    Past Surgical History:  Procedure Laterality Date   ABDOMINAL HYSTERECTOMY     BREAST SURGERY     for dilated duct   CATARACT EXTRACTION Bilateral    GLUTEUS MINIMUS REPAIR Left 02/12/2023   Procedure: LEFT GLUTEUS MEDIUS REPAIR WITH POSSIBLE COLLAGEN PATCH PLACEMENT;  Surgeon: Wilhelmenia Harada, MD;  Location: [facility];  Service: Orthopedics;  Laterality: Left;   left thyroid  lobectomy  2004   MYRINGOTOMY WITH TUBE PLACEMENT Left 12/19/2022   Procedure: MYRINGOTOMY WITH TUBE PLACEMENT;  Surgeon: Daleen Dubs, DO;  Location: [facility];  Service: ENT;  Laterality: Left;   Patient Active Problem List   Diagnosis Date Noted   S/P lumbar fusion 01/17/2024   Tear of left gluteus medius tendon 02/12/2023   Strain of gluteus medius 02/06/2023   Chronic serous otitis media 12/19/2022   Osteopenia 01/23/2016   Dyslipidemia 01/18/2016   History of esophageal stricture 01/18/2016   Postoperative hypothyroidism 01/18/2016   Esophageal reflux 01/18/2016   Snoring 01/18/2016     REFERRING PROVIDER:  Jeannette Mills, NP  Surgeon:  Joaquin Mulberry, MD  REFERRING DIAG: M43.16 (ICD-10-CM) - Spondylolisthesis, lumbar region  Rationale for Evaluation and Treatment: Rehabilitation  THERAPY DIAG:  No diagnosis found.  ONSET DATE: DOS 01/17/2024  SUBJECTIVE:  SUBJECTIVE STATEMENT: The patient went to [place]. She reports he back is stiff. She had shots in her hips.   Eval:Pt states she has had back pain for years with no specific MOI.  Pt underwent decompressive lumbar laminectomy, hemi facetectomy and foraminotomies L3-4 L4-5, Posterior lumbar interbody fusion L3-4 L4-5, Posterior fixation L3-L5, and Intertransverse arthrodesis L3-L5 on 01/17/24.  Pt saw MD on 01/30/24.  Pt did not have x rays though is having them next follow up.  Pt states she was informed she could drive and not to lift anything heavy.   Pt has been ambulating with SPC since hip surgery last year though doesn't use it as much in the house.  Pt states she has been performing a walking program.  She walks approx 6 minutes at a time.  Pt has increased pain with ambulating increased distance such as a grocery store.  Pt is unable to perform household chores.  Pt is limited with standing duration.    PERTINENT HISTORY:  -decompressive lumbar laminectomy, hemi facetectomy and foraminotomies L3-4 L4-5, Posterior lumbar interbody fusion L3-4 L4-5, Posterior fixation L3-L5, and Intertransverse arthrodesis L3-L5 on 01/17/24.  B / L / T restrictions though pt states MD stated it was ok for her to lean over and pick something up.   -L hip Gluteus medius repair and trochanteric bursectomy on 02/12/2023 which she may need to have it redone.  Pt still has L hip pain. -arthritis  PAIN:  NPRS:  0/10 current, 8-9/10 worst Worst pain is when she walks  too far.  Location:  central lumbar, bilat lumbar flanks L > R, bilat anterior hip and proximal thigh. Easing factors:  sitting, lying  PRECAUTIONS: Back   WEIGHT BEARING RESTRICTIONS: lifting restrictions  FALLS:  Has patient fallen in last 6 months?   LIVING ENVIRONMENT: Lives with: lives with their spouse   OCCUPATION: Pt is retired  PLOF: Independent.   Pt has been using a SPC since hip surgery April 2024.  PATIENT GOALS: to be able to walk as long as I want to  NEXT MD VISIT: Pt thinks 03/03/24  OBJECTIVE:  Note: Objective measures were completed at Evaluation unless otherwise noted.  DIAGNOSTIC FINDINGS:  Pt is post op.  She had a MRI prior to surgery.  PATIENT SURVEYS:  Modified Oswestry 36%   COGNITION: Overall cognitive status: Within functional limits for tasks assessed     SENSATION: L2-S2 WFL to LT bilat   LUMBAR ROM:   Not tested due to contraindicated for surgical protocol and post op healing   LOWER EXTREMITY MMT:   Strength not tested due to healing constraints and post op restrictions   GAIT: Level of assistance: Independent Comments:  Pt ambulates with a heel to toe gait with SPC on R.  Pt doesn't perform arm swing on L  TREATMENT:  5/16 Manual:   Trigger point release to bilateral paraspinals and bilateral gluteals in side lying .   There-ex:  LTR 2x10 with cuing  Ball roll fwd and lateral 2x10 each  Nu-step 5 min reviewed set up L 3   Neuro re-ed:  Seated:  Biceps curl with cueing for posture 3x10 2lb Punches 3x10 1lbs  Hip abduction yellow 3x10 with cuing for breathing and posture    Last visits Manual:   Trigger point release to bilateral paraspinals and bilateral gluteals.   There-ex: LTR 2x10 with cuing  Ball row fwd and lateral 2x10 each   Neuro-re-ed: Ball press 3x10 with core breathing   Bilateral ER with core breathing 3x10 yellow    Eval: PT educated pt in palpation and contraction of TrA.  Pt performed TrA contractions.   Pt performed  glute sets in supine x 10 reps with 5 sec hold and ankle pumps 2x10. PT instructed pt in Log rolling and pt performed in the clinic.  Pt received a HEP handout and was educated in correct form and appropriate frequency.  PT instructed pt she should not have pain with HEP.  PATIENT EDUCATION:  Education details:  PT educated pt concerning post op and protocol limitations and restrictions including no bending, lifting, and twisting.  Pt states MD informed her it was ok to lean over and pick something up.  PT educated pt concerning importance of walking program.  Bed mobility, HEP, exercise form, rationale of interventions, relevant anatomy, and POC. Person educated: Patient Education method: Explanation, Demonstration, Tactile cues, Verbal cues, and Handouts Education comprehension: verbalized understanding, returned demonstration, verbal cues required, tactile cues required, and needs further education  HOME EXERCISE PROGRAM: Access Code: [email or web address] URL: [email or web address] Date: 02/13/2024 Prepared by: Marnie Siren  Exercises - Log Roll  - Supine Gluteal Sets  - 2 x daily - 7 x weekly - 2 sets - 10 reps - 5 secons hold - Supine Transversus Abdominis Bracing - Hands on Stomach  - 2 x daily - 7 x weekly - 2 sets - 10 reps - Supine Single Leg Ankle Pumps  - 2-3 x daily - 7 x weekly - 2 sets - 10 reps  ASSESSMENT:  CLINICAL IMPRESSION: Therapy continues to focus on manual therapy to the lumbar spine. She continues to have significant trigger points. She tolerated treatment well though without increased pain. We added in a seated UE series. She reports difficulty putting things away She was advised this will help train some of those movements. We updated her HEP. We will continue to progress as tolerated.    Patient is a  78 y.o. female 3 weeks and 6 days s/p decompressive lumbar laminectomy, hemi facetectomy and foraminotomies L3-4 L4-5, Posterior lumbar interbody fusion L3-4 L4-5, Posterior fixation L3-L5, and Intertransverse arthrodesis L3-L5.  Pt has been performing a walking program.  Pt has increased pain with ambulating increased distance such as a grocery store.   Pt is is limited with standing duration and unable to perform household chores.  Pt reports having low back pain and anterior hip/proximal anterior thigh pain.  She has expected muscle weakness.  She received a HEP handout and was educated in correct positioning and form and appropriate frequency.  PT instructed pt in log rolling and she demonstrated good form with log rolling.  Pt responded well to Rx reporting no pain after treatment.  Pt should benefit form skilled PT per protocol to address impairments and improve overall function.    OBJECTIVE  IMPAIRMENTS: decreased activity tolerance, decreased endurance, decreased mobility, difficulty walking, decreased strength, and pain.   ACTIVITY LIMITATIONS: carrying, lifting, bending, standing, squatting, stairs, transfers, bed mobility, and locomotion level  PARTICIPATION LIMITATIONS: meal prep, cleaning, laundry, shopping, and community activity  PERSONAL FACTORS: Time since onset of injury/illness/exacerbation and 1 comorbidity: L hip Gluteus medius repair and continued L hi pain are also affecting patient's functional outcome.   REHAB POTENTIAL: Good  CLINICAL DECISION MAKING: Stable/uncomplicated  EVALUATION COMPLEXITY: Low   GOALS:  SHORT TERM GOALS: Target date:  03/12/2024    Pt will be independent and compliant with HEP for improved pain, strength, function, and mobility.   Baseline: Goal status: progressing 5/16  2.  Pt will report at least a 25% improvement in ambulation.  Baseline:  Goal status: INITIAL  3.  Pt will progress with walking program without adverse  effects. Baseline:  Goal status: still using walker 5/16 4.  Pt will progress with exercises per protocol without adverse effects for improved core and LE strength in order to for improved tolerance and ease with daily activities and functional mobility.  Baseline:  Goal status: INITIAL Target date:  04/09/2024   LONG TERM GOALS: Target date: 05/07/2024  Pt will report she is able to ambulate extended community distance without increased back pain.  Baseline:  Goal status: INITIAL  2.  Pt will report she is able to perform household chores with expected limitations without significant pain and difficulty.  Baseline:  Goal status: INITIAL  3.  Pt will report good standing tolerance with her ADL's, IADL's, and community mobility.  Baseline:  Goal status: INITIAL  4.  Pt will demonstrate at least a 12% improvement in modified ODI for clinically significant improvement in self perceived disability.  Baseline:  Goal status: INITIAL  5.  Pt will be independent with advanced HEP in order to have a consistent exercise routine for improved core and LE strength for good tolerance and performance of ADLs/IADLs and functional mobility.   Baseline:  Goal status: INITIAL    PLAN:  PT FREQUENCY: 1x/wk x 3-4 weeks and 2x/wk afterwards  PT DURATION: 12 weeks  PLANNED INTERVENTIONS: 97164- PT Re-evaluation, 97750- Physical Performance Testing, 97110-Therapeutic exercises, 97530- Therapeutic activity, 97112- Neuromuscular re-education, 97535- Self Care, 16109- Manual therapy, [phone]- Gait training, [phone]- Aquatic Therapy, [phone]- Electrical stimulation (unattended), Patient/Family education, Balance training, Stair training, Taping, Dry Needling, Joint mobilization, Scar mobilization, DME instructions, Cryotherapy, and Moist heat.  PLAN FOR NEXT SESSION: Cont per lumbar fusion protocol.    Signa Drier PT DPT 03/13/24 9:29 AM

## 2024-03-17 ENCOUNTER — Ambulatory Visit (HOSPITAL_BASED_OUTPATIENT_CLINIC_OR_DEPARTMENT_OTHER): Admitting: Physical Therapy

## 2024-03-17 DIAGNOSIS — M5459 Other low back pain: Secondary | ICD-10-CM

## 2024-03-17 DIAGNOSIS — M6281 Muscle weakness (generalized): Secondary | ICD-10-CM | POA: Diagnosis not present

## 2024-03-17 DIAGNOSIS — M25552 Pain in left hip: Secondary | ICD-10-CM | POA: Diagnosis not present

## 2024-03-17 DIAGNOSIS — R262 Difficulty in walking, not elsewhere classified: Secondary | ICD-10-CM | POA: Diagnosis not present

## 2024-03-17 NOTE — Therapy (Signed)
 OUTPATIENT PHYSICAL THERAPY THORACOLUMBAR TREATMENT   Patient Name: Margaret Graham MRN: [id number] DOB:[birth year], 78 y.o., female Today's Date: 03/18/2024  END OF SESSION:  PT End of Session - 03/17/24 1551     Visit Number 4    Number of Visits 20    Date for PT Re-Evaluation 05/07/24    Authorization Type MCR A and B    PT Start Time 1546    PT Stop Time 1628    PT Time Calculation (min) 42 min    Activity Tolerance Patient tolerated treatment well    Behavior During Therapy WFL for tasks assessed/performed               Past Medical History:  Diagnosis Date   Arthritis    GERD (gastroesophageal reflux disease)    Heart murmur    Hyperlipidemia    Hypothyroidism    Internal hemorrhoid    Osteopenia    Schatzki's ring    Sleep apnea    does not use a cpap machine   Thyroid  disease    Thyroid  nodule    right 1.7 cm   Tubular adenoma    Past Surgical History:  Procedure Laterality Date   ABDOMINAL HYSTERECTOMY     BREAST SURGERY     for dilated duct   CATARACT EXTRACTION Bilateral    GLUTEUS MINIMUS REPAIR Left 02/12/2023   Procedure: LEFT GLUTEUS MEDIUS REPAIR WITH POSSIBLE COLLAGEN PATCH PLACEMENT;  Surgeon: Wilhelmenia Harada, MD;  Location: [facility];  Service: Orthopedics;  Laterality: Left;   left thyroid  lobectomy  2004   MYRINGOTOMY WITH TUBE PLACEMENT Left 12/19/2022   Procedure: MYRINGOTOMY WITH TUBE PLACEMENT;  Surgeon: Daleen Dubs, DO;  Location: [facility];  Service: ENT;  Laterality: Left;   Patient Active Problem List   Diagnosis Date Noted   S/P lumbar fusion 01/17/2024   Tear of left gluteus medius tendon 02/12/2023   Strain of gluteus medius 02/06/2023   Chronic serous otitis media 12/19/2022   Osteopenia 01/23/2016   Dyslipidemia 01/18/2016   History of esophageal stricture 01/18/2016   Postoperative hypothyroidism 01/18/2016   Esophageal reflux 01/18/2016   Snoring 01/18/2016     REFERRING PROVIDER:  Jeannette Mills, NP  Surgeon:  Joaquin Mulberry, MD  REFERRING DIAG: M43.16 (ICD-10-CM) - Spondylolisthesis, lumbar region  Rationale for Evaluation and Treatment: Rehabilitation  THERAPY DIAG:  Other low back pain  Muscle weakness (generalized)  ONSET DATE: DOS 01/17/2024  SUBJECTIVE:  SUBJECTIVE STATEMENT: Pt is 8 weeks and 4 days s/p lumbar fusion.  Pt states she is doing "just alright".   Pt is still having pain in bilat glutes.  Pt ambulates with SPC, but not in the house.  Pt has pain with ambulation though states she is walking better.  Pt states the UE exercises we did last visit were good for me.  Pt has not been performing TrA contractions at home.    PERTINENT HISTORY:  -decompressive lumbar laminectomy, hemi facetectomy and foraminotomies L3-4 L4-5, Posterior lumbar interbody fusion L3-4 L4-5, Posterior fixation L3-L5, and Intertransverse arthrodesis L3-L5 on 01/17/24.  B / L / T restrictions though pt states MD stated it was ok for her to lean over and pick something up.   -L hip Gluteus medius repair and trochanteric bursectomy on 02/12/2023 which she may need to have it redone.  Pt still has L hip pain. -arthritis  PAIN:  NPRS:  0/10 current at rest, 5-6/10 pain with ambulation, 8-9/10 worst Worst pain is when she walks too far.  Location:  central lumbar, bilat lumbar flanks L > R, bilat anterior hip and proximal thigh. Easing factors:  sitting, lying  PRECAUTIONS: Back   WEIGHT BEARING RESTRICTIONS: lifting restrictions  FALLS:  Has patient fallen in last 6 months?   LIVING ENVIRONMENT: Lives with: lives with their spouse   OCCUPATION: Pt is retired  PLOF: Independent.   Pt has been using a SPC since hip surgery April 2024.  PATIENT GOALS: to be able to walk as  long as I want to  NEXT MD VISIT: Pt thinks 03/03/24  OBJECTIVE:  Note: Objective measures were completed at Evaluation unless otherwise noted.  DIAGNOSTIC FINDINGS:  Pt is post op.  She had a MRI prior to surgery.  PATIENT SURVEYS:  Modified Oswestry 36%   COGNITION: Overall cognitive status: Within functional limits for tasks assessed     SENSATION: L2-S2 WFL to LT bilat   LUMBAR ROM:   Not tested due to contraindicated for surgical protocol and post op healing   LOWER EXTREMITY MMT:   Strength not tested due to healing constraints and post op restrictions   GAIT: Level of assistance: Independent Comments:  Pt ambulates with a heel to toe gait with SPC on R.  Pt doesn't perform arm swing on L  TREATMENT:                                                                                                                               5/20 PT instructed pt in correct form and palpation of TrA contraction.  PT performed TrA contractions with cuing and instruction. Supine march with TrA x10, 1x5 Supine SLR with TrA 2x5 Supine manual HS stretch 2x30 sec bilat Seated hip abd withYTB 3x10 LAQ 2x10 Rows with TrA with YTB 2x10  Pt ambulated 2 laps with mostly with [facility]  5/16 Manual:   Trigger point release to bilateral paraspinals  and bilateral gluteals in side lying .   There-ex:  LTR 2x10 with cuing  Ball roll fwd and lateral 2x10 each  Nu-step 5 min reviewed set up L 3   Neuro re-ed:  Seated:  Biceps curl with cueing for posture 3x10 2lb Punches 3x10 1lbs  Hip abduction yellow 3x10 with cuing for breathing and posture    Last visits Manual:   Trigger point release to bilateral paraspinals and bilateral gluteals.   There-ex: LTR 2x10 with cuing  Ball row fwd and lateral 2x10 each   Neuro-re-ed: Ball press 3x10 with core breathing  Bilateral ER with core breathing 3x10 yellow    Eval: PT educated pt in palpation and contraction of TrA.  Pt performed TrA  contractions.   Pt performed  glute sets in supine x 10 reps with 5 sec hold and ankle pumps 2x10. PT instructed pt in Log rolling and pt performed in the clinic.  Pt received a HEP handout and was educated in correct form and appropriate frequency.  PT instructed pt she should not have pain with HEP.  PATIENT EDUCATION:  Education details:  PT educated pt concerning post op and protocol limitations and restrictions including no bending, lifting, and twisting.  Pt states MD informed her it was ok to lean over and pick something up.  PT educated pt concerning importance of walking program.  Bed mobility, HEP, exercise form, rationale of interventions, relevant anatomy, and POC. Person educated: Patient Education method: Explanation, Demonstration, Tactile cues, Verbal cues, and Handouts Education comprehension: verbalized understanding, returned demonstration, verbal cues required, tactile cues required, and needs further education  HOME EXERCISE PROGRAM: Access Code: [email or web address] URL: [email or web address] Date: 02/13/2024 Prepared by: Marnie Siren  Exercises - Log Roll  - Supine Gluteal Sets  - 2 x daily - 7 x weekly - 2 sets - 10 reps - 5 secons hold - Supine Transversus Abdominis Bracing - Hands on Stomach  - 2 x daily - 7 x weekly - 2 sets - 10 reps - Supine Single Leg Ankle Pumps  - 2-3 x daily - 7 x weekly - 2 sets - 10 reps  ASSESSMENT:  CLINICAL IMPRESSION: Pt continues to have pain in bilat glutes.  Pt has not been performing TrA contractions at home.  PT instructed her to perform TrA contractions and explained rationale.  Pt required cuing for correct form with TrA contraction though improved with performance with increased repetitions and cuing/instruction.  PT provided instruction and cuing for correct form with exercises and she had good tolerance with exercises.  Pt ambulates better with cane having decreased gait quality when ambulating without cane.  She responded  well to treatment having no increased pain after Rx.  Pt should benefit from cont skilled PT per protocol to address goals and impairments and improve overall function.     OBJECTIVE IMPAIRMENTS: decreased activity tolerance, decreased endurance, decreased mobility, difficulty walking, decreased strength, and pain.   ACTIVITY LIMITATIONS: carrying, lifting, bending, standing, squatting, stairs, transfers, bed mobility, and locomotion level  PARTICIPATION LIMITATIONS: meal prep, cleaning, laundry, shopping, and community activity  PERSONAL FACTORS: Time since onset of injury/illness/exacerbation and 1 comorbidity: L hip Gluteus medius repair and continued L hi pain are also affecting patient's functional outcome.   REHAB POTENTIAL: Good  CLINICAL DECISION MAKING: Stable/uncomplicated  EVALUATION COMPLEXITY: Low   GOALS:  SHORT TERM GOALS: Target date:  03/12/2024    Pt will be independent and compliant with HEP for improved pain, strength, function, and mobility.  Baseline: Goal status: progressing 5/16  2.  Pt will report at least a 25% improvement in ambulation.  Baseline:  Goal status: INITIAL  3.  Pt will progress with walking program without adverse effects. Baseline:  Goal status: still using walker 5/16 4.  Pt will progress with exercises per protocol without adverse effects for improved core and LE strength in order to for improved tolerance and ease with daily activities and functional mobility.  Baseline:  Goal status: INITIAL Target date:  04/09/2024   LONG TERM GOALS: Target date: 05/07/2024  Pt will report she is able to ambulate extended community distance without increased back pain.  Baseline:  Goal status: INITIAL  2.  Pt will report she is able to perform household chores with expected limitations without significant pain and difficulty.  Baseline:  Goal status: INITIAL  3.  Pt will report good standing tolerance with her ADL's, IADL's, and community  mobility.  Baseline:  Goal status: INITIAL  4.  Pt will demonstrate at least a 12% improvement in modified ODI for clinically significant improvement in self perceived disability.  Baseline:  Goal status: INITIAL  5.  Pt will be independent with advanced HEP in order to have a consistent exercise routine for improved core and LE strength for good tolerance and performance of ADLs/IADLs and functional mobility.   Baseline:  Goal status: INITIAL    PLAN:  PT FREQUENCY: 1x/wk x 3-4 weeks and 2x/wk afterwards  PT DURATION: 12 weeks  PLANNED INTERVENTIONS: 97164- PT Re-evaluation, 97750- Physical Performance Testing, 97110-Therapeutic exercises, 97530- Therapeutic activity, 97112- Neuromuscular re-education, 97535- Self Care, 40981- Manual therapy, [phone]- Gait training, [phone]- Aquatic Therapy, [phone]- Electrical stimulation (unattended), Patient/Family education, Balance training, Stair training, Taping, Dry Needling, Joint mobilization, Scar mobilization, DME instructions, Cryotherapy, and Moist heat.  PLAN FOR NEXT SESSION: Cont per lumbar fusion protocol.    Trina Fujita III PT, DPT 03/19/24 7:11 AM

## 2024-03-18 ENCOUNTER — Encounter (HOSPITAL_BASED_OUTPATIENT_CLINIC_OR_DEPARTMENT_OTHER): Payer: Self-pay | Admitting: Physical Therapy

## 2024-03-20 ENCOUNTER — Encounter (HOSPITAL_BASED_OUTPATIENT_CLINIC_OR_DEPARTMENT_OTHER): Payer: Self-pay | Admitting: Physical Therapy

## 2024-03-20 ENCOUNTER — Ambulatory Visit (HOSPITAL_BASED_OUTPATIENT_CLINIC_OR_DEPARTMENT_OTHER): Admitting: Physical Therapy

## 2024-03-20 DIAGNOSIS — M25552 Pain in left hip: Secondary | ICD-10-CM | POA: Diagnosis not present

## 2024-03-20 DIAGNOSIS — M6281 Muscle weakness (generalized): Secondary | ICD-10-CM

## 2024-03-20 DIAGNOSIS — R262 Difficulty in walking, not elsewhere classified: Secondary | ICD-10-CM

## 2024-03-20 DIAGNOSIS — M5459 Other low back pain: Secondary | ICD-10-CM

## 2024-03-20 NOTE — Therapy (Unsigned)
 OUTPATIENT PHYSICAL THERAPY THORACOLUMBAR TREATMENT   Patient Name: Margaret Graham MRN: [id number] DOB:1945-11-20, 78 y.o., female Today's Date: 03/20/2024  END OF SESSION:  PT End of Session - 03/20/24 1417     Visit Number 5    Number of Visits 20    Date for PT Re-Evaluation 05/07/24    Authorization Type MCR A and B    PT Start Time 1345    PT Stop Time 1428    PT Time Calculation (min) 43 min    Activity Tolerance Patient tolerated treatment well    Behavior During Therapy WFL for tasks assessed/performed               Past Medical History:  Diagnosis Date   Arthritis    GERD (gastroesophageal reflux disease)    Heart murmur    Hyperlipidemia    Hypothyroidism    Internal hemorrhoid    Osteopenia    Schatzki's ring    Sleep apnea    does not use a cpap machine   Thyroid  disease    Thyroid  nodule    right 1.7 cm   Tubular adenoma    Past Surgical History:  Procedure Laterality Date   ABDOMINAL HYSTERECTOMY     BREAST SURGERY     for dilated duct   CATARACT EXTRACTION Bilateral    GLUTEUS MINIMUS REPAIR Left 02/12/2023   Procedure: LEFT GLUTEUS MEDIUS REPAIR WITH POSSIBLE COLLAGEN PATCH PLACEMENT;  Surgeon: Wilhelmenia Harada, MD;  Location: [facility];  Service: Orthopedics;  Laterality: Left;   left thyroid  lobectomy  2004   MYRINGOTOMY WITH TUBE PLACEMENT Left 12/19/2022   Procedure: MYRINGOTOMY WITH TUBE PLACEMENT;  Surgeon: Daleen Dubs, DO;  Location: [facility];  Service: ENT;  Laterality: Left;   Patient Active Problem List   Diagnosis Date Noted   S/P lumbar fusion 01/17/2024   Tear of left gluteus medius tendon 02/12/2023   Strain of gluteus medius 02/06/2023   Chronic serous otitis media 12/19/2022   Osteopenia 01/23/2016   Dyslipidemia 01/18/2016   History of esophageal stricture 01/18/2016   Postoperative hypothyroidism 01/18/2016   Esophageal reflux 01/18/2016   Snoring 01/18/2016     REFERRING PROVIDER:  Jeannette Mills, NP  Surgeon:  Joaquin Mulberry, MD  REFERRING DIAG: M43.16 (ICD-10-CM) - Spondylolisthesis, lumbar region  Rationale for Evaluation and Treatment: Rehabilitation  THERAPY DIAG:  Other low back pain  Muscle weakness (generalized)  Pain in left hip  Difficulty walking  ONSET DATE: DOS 01/17/2024  SUBJECTIVE:  SUBJECTIVE STATEMENT: The patient reports that she continues to have pain at times but some days are better. She traveled to [place] and back without significant pain.   PERTINENT HISTORY:  -decompressive lumbar laminectomy, hemi facetectomy and foraminotomies L3-4 L4-5, Posterior lumbar interbody fusion L3-4 L4-5, Posterior fixation L3-L5, and Intertransverse arthrodesis L3-L5 on 01/17/24.  B / L / T restrictions though pt states MD stated it was ok for her to lean over and pick something up.   -L hip Gluteus medius repair and trochanteric bursectomy on 02/12/2023 which she may need to have it redone.  Pt still has L hip pain. -arthritis  PAIN:  NPRS:  0/10 current at rest, 5-6/10 pain with ambulation, 8-9/10 worst Worst pain is when she walks too far.  Location:  central lumbar, bilat lumbar flanks L > R, bilat anterior hip and proximal thigh. Easing factors:  sitting, lying  PRECAUTIONS: Back   WEIGHT BEARING RESTRICTIONS: lifting restrictions  FALLS:  Has patient fallen in last 6 months?   LIVING ENVIRONMENT: Lives with: lives with their spouse   OCCUPATION: Pt is retired  PLOF: Independent.   Pt has been using a SPC since hip surgery April 2024.  PATIENT GOALS: to be able to walk as long as I want to  NEXT MD VISIT: Pt thinks 03/03/24  OBJECTIVE:  Note: Objective measures were completed at Evaluation unless otherwise noted.  DIAGNOSTIC FINDINGS:   Pt is post op.  She had a MRI prior to surgery.  PATIENT SURVEYS:  Modified Oswestry 36%   COGNITION: Overall cognitive status: Within functional limits for tasks assessed     SENSATION: L2-S2 WFL to LT bilat   LUMBAR ROM:   Not tested due to contraindicated for surgical protocol and post op healing   LOWER EXTREMITY MMT:   Strength not tested due to healing constraints and post op restrictions   GAIT: Level of assistance: Independent Comments:  Pt ambulates with a heel to toe gait with SPC on R.  Pt doesn't perform arm swing on L  TREATMENT:                                                                                                                               5/27 There-ex:  Ball roll fwd and lateral 2x10 each  Nu-step 5 min reviewed set up L 3   Neuro-re-ed Ball press 2x10  Hip abduction with band 2x10  Row with posture 2x10 red  Shoulder extension with posture 2x10   There-act:  Step up 2 inch 2x10   Lateral step up 2x10  Sit to stand transfer 3x5 from lowest table position     5/20 PT instructed pt in correct form and palpation of TrA contraction.  PT performed TrA contractions with cuing and instruction. Supine march with TrA x10, 1x5 Supine SLR with TrA 2x5 Supine manual HS stretch 2x30 sec bilat Seated hip abd withYTB 3x10 LAQ 2x10 Rows with TrA with  YTB 2x10  Pt ambulated 2 laps with mostly with [facility]  5/16 Manual:   Trigger point release to bilateral paraspinals and bilateral gluteals in side lying .   There-ex:  LTR 2x10 with cuing  Ball roll fwd and lateral 2x10 each  Nu-step 5 min reviewed set up L 3   Neuro re-ed:  Seated:  Biceps curl with cueing for posture 3x10 2lb Punches 3x10 1lbs  Hip abduction yellow 3x10 with cuing for breathing and posture    Last visits Manual:   Trigger point release to bilateral paraspinals and bilateral gluteals.   There-ex: LTR 2x10 with cuing  Ball row fwd and lateral 2x10 each    Neuro-re-ed: Ball press 3x10 with core breathing  Bilateral ER with core breathing 3x10 yellow    Eval: PT educated pt in palpation and contraction of TrA.  Pt performed TrA contractions.   Pt performed  glute sets in supine x 10 reps with 5 sec hold and ankle pumps 2x10. PT instructed pt in Log rolling and pt performed in the clinic.  Pt received a HEP handout and was educated in correct form and appropriate frequency.  PT instructed pt she should not have pain with HEP.  PATIENT EDUCATION:  Education details:  PT educated pt concerning post op and protocol limitations and restrictions including no bending, lifting, and twisting.  Pt states MD informed her it was ok to lean over and pick something up.  PT educated pt concerning importance of walking program.  Bed mobility, HEP, exercise form, rationale of interventions, relevant anatomy, and POC. Person educated: Patient Education method: Explanation, Demonstration, Tactile cues, Verbal cues, and Handouts Education comprehension: verbalized understanding, returned demonstration, verbal cues required, tactile cues required, and needs further education  HOME EXERCISE PROGRAM: Access Code: [email or web address] URL: [email or web address] Date: 02/13/2024 Prepared by: Marnie Siren  Exercises - Log Roll  - Supine Gluteal Sets  - 2 x daily - 7 x weekly - 2 sets - 10 reps - 5 secons hold - Supine Transversus Abdominis Bracing - Hands on Stomach  - 2 x daily - 7 x weekly - 2 sets - 10 reps - Supine Single Leg Ankle Pumps  - 2-3 x daily - 7 x weekly - 2 sets - 10 reps  ASSESSMENT:  CLINICAL IMPRESSION: Patient did well today. We focused more on functional strengthening today. She tolerated very well. We added step up and worked on sit to stand transfers. She had no pain. She appears to be walking more upright and with more of a fluid gait coming in today.     OBJECTIVE IMPAIRMENTS: decreased activity tolerance, decreased endurance,  decreased mobility, difficulty walking, decreased strength, and pain.   ACTIVITY LIMITATIONS: carrying, lifting, bending, standing, squatting, stairs, transfers, bed mobility, and locomotion level  PARTICIPATION LIMITATIONS: meal prep, cleaning, laundry, shopping, and community activity  PERSONAL FACTORS: Time since onset of injury/illness/exacerbation and 1 comorbidity: L hip Gluteus medius repair and continued L hi pain are also affecting patient's functional outcome.   REHAB POTENTIAL: Good  CLINICAL DECISION MAKING: Stable/uncomplicated  EVALUATION COMPLEXITY: Low   GOALS:  SHORT TERM GOALS: Target date:  03/12/2024    Pt will be independent and compliant with HEP for improved pain, strength, function, and mobility.   Baseline: Goal status: progressing 5/16  2.  Pt will report at least a 25% improvement in ambulation.  Baseline:  Goal status: INITIAL  3.  Pt will progress with walking program without adverse effects. Baseline:  Goal status:  still using walker 5/16 4.  Pt will progress with exercises per protocol without adverse effects for improved core and LE strength in order to for improved tolerance and ease with daily activities and functional mobility.  Baseline:  Goal status: INITIAL Target date:  04/09/2024   LONG TERM GOALS: Target date: 05/07/2024  Pt will report she is able to ambulate extended community distance without increased back pain.  Baseline:  Goal status: INITIAL  2.  Pt will report she is able to perform household chores with expected limitations without significant pain and difficulty.  Baseline:  Goal status: INITIAL  3.  Pt will report good standing tolerance with her ADL's, IADL's, and community mobility.  Baseline:  Goal status: INITIAL  4.  Pt will demonstrate at least a 12% improvement in modified ODI for clinically significant improvement in self perceived disability.  Baseline:  Goal status: INITIAL  5.  Pt will be independent  with advanced HEP in order to have a consistent exercise routine for improved core and LE strength for good tolerance and performance of ADLs/IADLs and functional mobility.   Baseline:  Goal status: INITIAL    PLAN:  PT FREQUENCY: 1x/wk x 3-4 weeks and 2x/wk afterwards  PT DURATION: 12 weeks  PLANNED INTERVENTIONS: 97164- PT Re-evaluation, 97750- Physical Performance Testing, 97110-Therapeutic exercises, 97530- Therapeutic activity, 97112- Neuromuscular re-education, 97535- Self Care, 69629- Manual therapy, [phone]- Gait training, [phone]- Aquatic Therapy, [phone]- Electrical stimulation (unattended), Patient/Family education, Balance training, Stair training, Taping, Dry Needling, Joint mobilization, Scar mobilization, DME instructions, Cryotherapy, and Moist heat.  PLAN FOR NEXT SESSION: Cont per lumbar fusion protocol.    Signa Drier PT DPT 03/20/24 2:24 PM

## 2024-03-25 ENCOUNTER — Ambulatory Visit (HOSPITAL_BASED_OUTPATIENT_CLINIC_OR_DEPARTMENT_OTHER): Admitting: Physical Therapy

## 2024-03-25 ENCOUNTER — Encounter (HOSPITAL_BASED_OUTPATIENT_CLINIC_OR_DEPARTMENT_OTHER): Payer: Self-pay | Admitting: Physical Therapy

## 2024-03-25 DIAGNOSIS — M25552 Pain in left hip: Secondary | ICD-10-CM | POA: Diagnosis not present

## 2024-03-25 DIAGNOSIS — M5459 Other low back pain: Secondary | ICD-10-CM | POA: Diagnosis not present

## 2024-03-25 DIAGNOSIS — M6281 Muscle weakness (generalized): Secondary | ICD-10-CM

## 2024-03-25 DIAGNOSIS — R262 Difficulty in walking, not elsewhere classified: Secondary | ICD-10-CM | POA: Diagnosis not present

## 2024-03-25 NOTE — Therapy (Signed)
 OUTPATIENT PHYSICAL THERAPY THORACOLUMBAR TREATMENT   Patient Name: Margaret Graham MRN: [id number] DOB:Jul 15, 1946, 78 y.o., female Today's Date: 03/26/2024  END OF SESSION:  PT End of Session - 03/25/24 1227     Visit Number 6    Number of Visits 20    Date for PT Re-Evaluation 05/07/24    Authorization Type MCR A and B    PT Start Time 1210    PT Stop Time 1250    PT Time Calculation (min) 40 min    Activity Tolerance Patient tolerated treatment well    Behavior During Therapy WFL for tasks assessed/performed                Past Medical History:  Diagnosis Date   Arthritis    GERD (gastroesophageal reflux disease)    Heart murmur    Hyperlipidemia    Hypothyroidism    Internal hemorrhoid    Osteopenia    Schatzki's ring    Sleep apnea    does not use a cpap machine   Thyroid  disease    Thyroid  nodule    right 1.7 cm   Tubular adenoma    Past Surgical History:  Procedure Laterality Date   ABDOMINAL HYSTERECTOMY     BREAST SURGERY     for dilated duct   CATARACT EXTRACTION Bilateral    GLUTEUS MINIMUS REPAIR Left 02/12/2023   Procedure: LEFT GLUTEUS MEDIUS REPAIR WITH POSSIBLE COLLAGEN PATCH PLACEMENT;  Surgeon: Wilhelmenia Harada, MD;  Location: [facility];  Service: Orthopedics;  Laterality: Left;   left thyroid  lobectomy  2004   MYRINGOTOMY WITH TUBE PLACEMENT Left 12/19/2022   Procedure: MYRINGOTOMY WITH TUBE PLACEMENT;  Surgeon: Daleen Dubs, DO;  Location: [facility];  Service: ENT;  Laterality: Left;   Patient Active Problem List   Diagnosis Date Noted   S/P lumbar fusion 01/17/2024   Tear of left gluteus medius tendon 02/12/2023   Strain of gluteus medius 02/06/2023   Chronic serous otitis media 12/19/2022   Osteopenia 01/23/2016   Dyslipidemia 01/18/2016   History of esophageal stricture 01/18/2016   Postoperative hypothyroidism 01/18/2016   Esophageal reflux 01/18/2016   Snoring 01/18/2016     REFERRING PROVIDER:  Jeannette Mills, NP  Surgeon:  Joaquin Mulberry, MD  REFERRING DIAG: M43.16 (ICD-10-CM) - Spondylolisthesis, lumbar region  Rationale for Evaluation and Treatment: Rehabilitation  THERAPY DIAG:  Other low back pain  Muscle weakness (generalized)  ONSET DATE: DOS 01/17/2024  SUBJECTIVE:  SUBJECTIVE STATEMENT: Pt is 9 weeks and 5 days s/p lumbar fusion.  Pt denies any adverse effects after prior Rx.  Pt has pain with walking.  She states her pain is worse in AM.    PERTINENT HISTORY:  -decompressive lumbar laminectomy, hemi facetectomy and foraminotomies L3-4 L4-5, Posterior lumbar interbody fusion L3-4 L4-5, Posterior fixation L3-L5, and Intertransverse arthrodesis L3-L5 on 01/17/24.  B / L / T restrictions though pt states MD stated it was ok for her to lean over and pick something up.   -L hip Gluteus medius repair and trochanteric bursectomy on 02/12/2023 which she may need to have it redone.  Pt still has L hip pain. -arthritis  PAIN:  NPRS:  4/10 current at rest, 5-6/10 pain with ambulation, 8-9/10 worst Worst pain is when she walks too far.  Location:  central lumbar, bilat lumbar flanks  Easing factors:  sitting, lying  PRECAUTIONS: Back   WEIGHT BEARING RESTRICTIONS: lifting restrictions  FALLS:  Has patient fallen in last 6 months?   LIVING ENVIRONMENT: Lives with: lives with their spouse   OCCUPATION: Pt is retired  PLOF: Independent.   Pt has been using a SPC since hip surgery April 2024.  PATIENT GOALS: to be able to walk as long as I want to  NEXT MD VISIT:   OBJECTIVE:  Note: Objective measures were completed at Evaluation unless otherwise noted.  DIAGNOSTIC FINDINGS:  Pt is post op.  She had a MRI prior to surgery.  PATIENT SURVEYS:  Modified Oswestry 36%    COGNITION: Overall cognitive status: Within functional limits for tasks assessed     SENSATION: L2-S2 WFL to LT bilat   LUMBAR ROM:   Not tested due to contraindicated for surgical protocol and post op healing   LOWER EXTREMITY MMT:   Strength not tested due to healing constraints and post op restrictions   GAIT: Level of assistance: Independent Comments:  Pt ambulates with a heel to toe gait with SPC on R.  Pt doesn't perform arm swing on L  TREATMENT:                                                                                                                               5/28 Supine march with TrA 2x10 Supine SLR with TrA 2x10 Supine bridge with TrA 2x10 Attempted supine manual HS stretch Sit to stand with TrA  3x5 Seated hip abd with RTB 2x10 Standing rows with TrA 2x10 with RTB Standing shoulder extension with TrA 2x10 with RTB  Pt ambulated 3 laps with [facility]  5/23 There-ex:  Ball roll fwd and lateral 2x10 each  Nu-step 5 min reviewed set up L 3   Neuro-re-ed Ball press 2x10  Hip abduction with band 2x10  Row with posture 2x10 red  Shoulder extension with posture 2x10   There-act:  Step up 2 inch 2x10   Lateral step up 2x10  Sit to stand transfer 3x5 from  lowest table position     5/20 PT instructed pt in correct form and palpation of TrA contraction.  PT performed TrA contractions with cuing and instruction. Supine march with TrA x10, 1x5 Supine SLR with TrA 2x5 Supine manual HS stretch 2x30 sec bilat Seated hip abd withYTB 3x10 LAQ 2x10 Rows with TrA with YTB 2x10  Pt ambulated 2 laps with mostly with [facility]  5/16 Manual:   Trigger point release to bilateral paraspinals and bilateral gluteals in side lying .   There-ex:  LTR 2x10 with cuing  Ball roll fwd and lateral 2x10 each  Nu-step 5 min reviewed set up L 3   Neuro re-ed:  Seated:  Biceps curl with cueing for posture 3x10 2lb Punches 3x10 1lbs  Hip abduction yellow 3x10 with  cuing for breathing and posture    Last visits Manual:   Trigger point release to bilateral paraspinals and bilateral gluteals.   There-ex: LTR 2x10 with cuing  Ball row fwd and lateral 2x10 each   Neuro-re-ed: Ball press 3x10 with core breathing  Bilateral ER with core breathing 3x10 yellow    PATIENT EDUCATION:  Education details:  PT educated pt concerning post op and protocol limitations and restrictions including no bending, lifting, and twisting.  Pt states MD informed her it was ok to lean over and pick something up.  PT educated pt concerning importance of walking program.  Bed mobility, HEP, exercise form, rationale of interventions, relevant anatomy, and POC. Person educated: Patient Education method: Explanation, Demonstration, Tactile cues, Verbal cues, and Handouts Education comprehension: verbalized understanding, returned demonstration, verbal cues required, tactile cues required, and needs further education  HOME EXERCISE PROGRAM: Access Code: [email or web address] URL: [email or web address] Date: 02/13/2024 Prepared by: Marnie Siren  Exercises - Log Roll  - Supine Gluteal Sets  - 2 x daily - 7 x weekly - 2 sets - 10 reps - 5 secons hold - Supine Transversus Abdominis Bracing - Hands on Stomach  - 2 x daily - 7 x weekly - 2 sets - 10 reps - Supine Single Leg Ankle Pumps  - 2-3 x daily - 7 x weekly - 2 sets - 10 reps  ASSESSMENT:  CLINICAL IMPRESSION: PT provided cuing for correct form with TrA contraction and pt is improving with form with TrA contractions.  Pt performed exercises per protocol well with cuing and instruction in correct form.  PT attempted supine manual HS stretch though she had good HS flexibility with contralateral knee bent.  Pt ambulated 3 laps today with SPC with PT and is improving with gait.  She responded well to Rx reporting improved pain from 4/10 before Rx to 0/10 after Rx.  Pt should benefit from cont skilled PT to address  impairments and goals and improve core strength   OBJECTIVE IMPAIRMENTS: decreased activity tolerance, decreased endurance, decreased mobility, difficulty walking, decreased strength, and pain.   ACTIVITY LIMITATIONS: carrying, lifting, bending, standing, squatting, stairs, transfers, bed mobility, and locomotion level  PARTICIPATION LIMITATIONS: meal prep, cleaning, laundry, shopping, and community activity  PERSONAL FACTORS: Time since onset of injury/illness/exacerbation and 1 comorbidity: L hip Gluteus medius repair and continued L hi pain are also affecting patient's functional outcome.   REHAB POTENTIAL: Good  CLINICAL DECISION MAKING: Stable/uncomplicated  EVALUATION COMPLEXITY: Low   GOALS:  SHORT TERM GOALS: Target date:  03/12/2024    Pt will be independent and compliant with HEP for improved pain, strength, function, and mobility.   Baseline: Goal status: progressing 5/16  2.  Pt will report at least a 25% improvement in ambulation.  Baseline:  Goal status: INITIAL  3.  Pt will progress with walking program without adverse effects. Baseline:  Goal status: still using walker 5/16 4.  Pt will progress with exercises per protocol without adverse effects for improved core and LE strength in order to for improved tolerance and ease with daily activities and functional mobility.  Baseline:  Goal status: INITIAL Target date:  04/09/2024   LONG TERM GOALS: Target date: 05/07/2024  Pt will report she is able to ambulate extended community distance without increased back pain.  Baseline:  Goal status: INITIAL  2.  Pt will report she is able to perform household chores with expected limitations without significant pain and difficulty.  Baseline:  Goal status: INITIAL  3.  Pt will report good standing tolerance with her ADL's, IADL's, and community mobility.  Baseline:  Goal status: INITIAL  4.  Pt will demonstrate at least a 12% improvement in modified ODI for  clinically significant improvement in self perceived disability.  Baseline:  Goal status: INITIAL  5.  Pt will be independent with advanced HEP in order to have a consistent exercise routine for improved core and LE strength for good tolerance and performance of ADLs/IADLs and functional mobility.   Baseline:  Goal status: INITIAL    PLAN:  PT FREQUENCY: 1x/wk x 3-4 weeks and 2x/wk afterwards  PT DURATION: 12 weeks  PLANNED INTERVENTIONS: 97164- PT Re-evaluation, 97750- Physical Performance Testing, 97110-Therapeutic exercises, 97530- Therapeutic activity, 97112- Neuromuscular re-education, 97535- Self Care, 16109- Manual therapy, [phone]- Gait training, [phone]- Aquatic Therapy, [phone]- Electrical stimulation (unattended), Patient/Family education, Balance training, Stair training, Taping, Dry Needling, Joint mobilization, Scar mobilization, DME instructions, Cryotherapy, and Moist heat.  PLAN FOR NEXT SESSION: Cont per lumbar fusion protocol.    Trina Fujita III PT, DPT 03/26/24 12:40 PM

## 2024-03-27 ENCOUNTER — Ambulatory Visit (HOSPITAL_BASED_OUTPATIENT_CLINIC_OR_DEPARTMENT_OTHER): Payer: Self-pay | Admitting: Physical Therapy

## 2024-03-27 DIAGNOSIS — M5459 Other low back pain: Secondary | ICD-10-CM | POA: Diagnosis not present

## 2024-03-27 DIAGNOSIS — M25552 Pain in left hip: Secondary | ICD-10-CM

## 2024-03-27 DIAGNOSIS — M6281 Muscle weakness (generalized): Secondary | ICD-10-CM

## 2024-03-27 DIAGNOSIS — R262 Difficulty in walking, not elsewhere classified: Secondary | ICD-10-CM

## 2024-03-27 NOTE — Therapy (Signed)
 OUTPATIENT PHYSICAL THERAPY THORACOLUMBAR TREATMENT   Patient Name: Margaret Graham MRN: [id number] DOB:[birth year], 78 y.o., female Today's Date: 03/27/2024  END OF SESSION:  PT End of Session - 03/27/24 1640     Visit Number 7    Number of Visits 20    Date for PT Re-Evaluation 05/07/24    Authorization Type MCR A and B    PT Start Time 1600    PT Stop Time 1643    PT Time Calculation (min) 43 min    Activity Tolerance Patient tolerated treatment well    Behavior During Therapy WFL for tasks assessed/performed                 Past Medical History:  Diagnosis Date   Arthritis    GERD (gastroesophageal reflux disease)    Heart murmur    Hyperlipidemia    Hypothyroidism    Internal hemorrhoid    Osteopenia    Schatzki's ring    Sleep apnea    does not use a cpap machine   Thyroid  disease    Thyroid  nodule    right 1.7 cm   Tubular adenoma    Past Surgical History:  Procedure Laterality Date   ABDOMINAL HYSTERECTOMY     BREAST SURGERY     for dilated duct   CATARACT EXTRACTION Bilateral    GLUTEUS MINIMUS REPAIR Left 02/12/2023   Procedure: LEFT GLUTEUS MEDIUS REPAIR WITH POSSIBLE COLLAGEN PATCH PLACEMENT;  Surgeon: Wilhelmenia Harada, MD;  Location: [facility];  Service: Orthopedics;  Laterality: Left;   left thyroid  lobectomy  2004   MYRINGOTOMY WITH TUBE PLACEMENT Left 12/19/2022   Procedure: MYRINGOTOMY WITH TUBE PLACEMENT;  Surgeon: Daleen Dubs, DO;  Location: [facility];  Service: ENT;  Laterality: Left;   Patient Active Problem List   Diagnosis Date Noted   S/P lumbar fusion 01/17/2024   Tear of left gluteus medius tendon 02/12/2023   Strain of gluteus medius 02/06/2023   Chronic serous otitis media 12/19/2022   Osteopenia 01/23/2016   Dyslipidemia 01/18/2016   History of esophageal stricture 01/18/2016   Postoperative hypothyroidism 01/18/2016   Esophageal reflux 01/18/2016   Snoring 01/18/2016     REFERRING  PROVIDER: Jeannette Mills, NP  Surgeon:  Joaquin Mulberry, MD  REFERRING DIAG: M43.16 (ICD-10-CM) - Spondylolisthesis, lumbar region  Rationale for Evaluation and Treatment: Rehabilitation  THERAPY DIAG:  Other low back pain  Muscle weakness (generalized)  Pain in left hip  Difficulty walking  ONSET DATE: DOS 01/17/2024  SUBJECTIVE:  SUBJECTIVE STATEMENT: The patient reports she has had significant soreness in the front of her legs since the last visit. She has to lift her legs up to get them into the bed.  PERTINENT HISTORY:  -decompressive lumbar laminectomy, hemi facetectomy and foraminotomies L3-4 L4-5, Posterior lumbar interbody fusion L3-4 L4-5, Posterior fixation L3-L5, and Intertransverse arthrodesis L3-L5 on 01/17/24.  B / L / T restrictions though pt states MD stated it was ok for her to lean over and pick something up.   -L hip Gluteus medius repair and trochanteric bursectomy on 02/12/2023 which she may need to have it redone.  Pt still has L hip pain. -arthritis  PAIN:  NPRS:  4/10 current at rest, 5-6/10 pain with ambulation, 8-9/10 worst Worst pain is when she walks too far.  Location:  central lumbar, bilat lumbar flanks  Easing factors:  sitting, lying  PRECAUTIONS: Back   WEIGHT BEARING RESTRICTIONS: lifting restrictions  FALLS:  Has patient fallen in last 6 months?   LIVING ENVIRONMENT: Lives with: lives with their spouse   OCCUPATION: Pt is retired  PLOF: Independent.   Pt has been using a SPC since hip surgery April 2024.  PATIENT GOALS: to be able to walk as long as I want to  NEXT MD VISIT:   OBJECTIVE:  Note: Objective measures were completed at Evaluation unless otherwise noted.  DIAGNOSTIC FINDINGS:  Pt is post op.  She had a MRI prior to  surgery.  PATIENT SURVEYS:  Modified Oswestry 36%   COGNITION: Overall cognitive status: Within functional limits for tasks assessed     SENSATION: L2-S2 WFL to LT bilat   LUMBAR ROM:   Not tested due to contraindicated for surgical protocol and post op healing   LOWER EXTREMITY MMT:   Strength not tested due to healing constraints and post op restrictions   GAIT: Level of assistance: Independent Comments:  Pt ambulates with a heel to toe gait with SPC on R.  Pt doesn't perform arm swing on L  TREATMENT:                                                                                                                               5/30 Manual:  Roller to anterior hip Trigger point release to anterior hip  Review of self trigger point release  There-ex:  Quad set 3x10  LTR 3x10   Neuro re-ed:  Punches 2lbs 3x10  Bicep curl with posture 3x10 2lbs       5/28 Supine march with TrA 2x10 Supine SLR with TrA 2x10 Supine bridge with TrA 2x10 Attempted supine manual HS stretch Sit to stand with TrA  3x5 Seated hip abd with RTB 2x10 Standing rows with TrA 2x10 with RTB Standing shoulder extension with TrA 2x10 with RTB  Pt ambulated 3 laps with [facility]  5/23 There-ex:  Ball roll fwd and lateral 2x10 each  Nu-step 5 min reviewed set up L 3  Neuro-re-ed Ball press 2x10  Hip abduction with band 2x10  Row with posture 2x10 red  Shoulder extension with posture 2x10   There-act:  Step up 2 inch 2x10   Lateral step up 2x10  Sit to stand transfer 3x5 from lowest table position     5/20 PT instructed pt in correct form and palpation of TrA contraction.  PT performed TrA contractions with cuing and instruction. Supine march with TrA x10, 1x5 Supine SLR with TrA 2x5 Supine manual HS stretch 2x30 sec bilat Seated hip abd withYTB 3x10 LAQ 2x10 Rows with TrA with YTB 2x10  Pt ambulated 2 laps with mostly with [facility]  5/16 Manual:   Trigger point release to  bilateral paraspinals and bilateral gluteals in side lying .   There-ex:  LTR 2x10 with cuing  Ball roll fwd and lateral 2x10 each  Nu-step 5 min reviewed set up L 3   Neuro re-ed:  Seated:  Biceps curl with cueing for posture 3x10 2lb Punches 3x10 1lbs  Hip abduction yellow 3x10 with cuing for breathing and posture    Last visits Manual:   Trigger point release to bilateral paraspinals and bilateral gluteals.   There-ex: LTR 2x10 with cuing  Ball row fwd and lateral 2x10 each   Neuro-re-ed: Ball press 3x10 with core breathing  Bilateral ER with core breathing 3x10 yellow    PATIENT EDUCATION:  Education details:  PT educated pt concerning post op and protocol limitations and restrictions including no bending, lifting, and twisting.  Pt states MD informed her it was ok to lean over and pick something up.  PT educated pt concerning importance of walking program.  Bed mobility, HEP, exercise form, rationale of interventions, relevant anatomy, and POC. Person educated: Patient Education method: Explanation, Demonstration, Tactile cues, Verbal cues, and Handouts Education comprehension: verbalized understanding, returned demonstration, verbal cues required, tactile cues required, and needs further education  HOME EXERCISE PROGRAM: Access Code: [email or web address] URL: [email or web address] Date: 02/13/2024 Prepared by: Marnie Siren  Exercises - Log Roll  - Supine Gluteal Sets  - 2 x daily - 7 x weekly - 2 sets - 10 reps - 5 secons hold - Supine Transversus Abdominis Bracing - Hands on Stomach  - 2 x daily - 7 x weekly - 2 sets - 10 reps - Supine Single Leg Ankle Pumps  - 2-3 x daily - 7 x weekly - 2 sets - 10 reps  ASSESSMENT:  CLINICAL IMPRESSION: Therapy worked on manual therapy to the hips today. She appears to have DOMS in both hips. We talked to her about normal progression of DOMS. She was given light exercises to work on. Therapy will continue to progress as  tolerated.    OBJECTIVE IMPAIRMENTS: decreased activity tolerance, decreased endurance, decreased mobility, difficulty walking, decreased strength, and pain.   ACTIVITY LIMITATIONS: carrying, lifting, bending, standing, squatting, stairs, transfers, bed mobility, and locomotion level  PARTICIPATION LIMITATIONS: meal prep, cleaning, laundry, shopping, and community activity  PERSONAL FACTORS: Time since onset of injury/illness/exacerbation and 1 comorbidity: L hip Gluteus medius repair and continued L hi pain are also affecting patient's functional outcome.   REHAB POTENTIAL: Good  CLINICAL DECISION MAKING: Stable/uncomplicated  EVALUATION COMPLEXITY: Low   GOALS:  SHORT TERM GOALS: Target date:  03/12/2024    Pt will be independent and compliant with HEP for improved pain, strength, function, and mobility.   Baseline: Goal status: progressing 5/16  2.  Pt will report at least a 25% improvement in ambulation.  Baseline:  Goal status: INITIAL  3.  Pt will progress with walking program without adverse effects. Baseline:  Goal status: still using walker 5/16 4.  Pt will progress with exercises per protocol without adverse effects for improved core and LE strength in order to for improved tolerance and ease with daily activities and functional mobility.  Baseline:  Goal status: INITIAL Target date:  04/09/2024   LONG TERM GOALS: Target date: 05/07/2024  Pt will report she is able to ambulate extended community distance without increased back pain.  Baseline:  Goal status: INITIAL  2.  Pt will report she is able to perform household chores with expected limitations without significant pain and difficulty.  Baseline:  Goal status: INITIAL  3.  Pt will report good standing tolerance with her ADL's, IADL's, and community mobility.  Baseline:  Goal status: INITIAL  4.  Pt will demonstrate at least a 12% improvement in modified ODI for clinically significant improvement in  self perceived disability.  Baseline:  Goal status: INITIAL  5.  Pt will be independent with advanced HEP in order to have a consistent exercise routine for improved core and LE strength for good tolerance and performance of ADLs/IADLs and functional mobility.   Baseline:  Goal status: INITIAL    PLAN:  PT FREQUENCY: 1x/wk x 3-4 weeks and 2x/wk afterwards  PT DURATION: 12 weeks  PLANNED INTERVENTIONS: 97164- PT Re-evaluation, 97750- Physical Performance Testing, 97110-Therapeutic exercises, 97530- Therapeutic activity, 97112- Neuromuscular re-education, 97535- Self Care, 29562- Manual therapy, [phone]- Gait training, [phone]- Aquatic Therapy, [phone]- Electrical stimulation (unattended), Patient/Family education, Balance training, Stair training, Taping, Dry Needling, Joint mobilization, Scar mobilization, DME instructions, Cryotherapy, and Moist heat.  PLAN FOR NEXT SESSION: Cont per lumbar fusion protocol.    Trina Fujita III PT, DPT 03/27/24 4:43 PM

## 2024-03-31 ENCOUNTER — Ambulatory Visit (HOSPITAL_BASED_OUTPATIENT_CLINIC_OR_DEPARTMENT_OTHER): Attending: Student | Admitting: Physical Therapy

## 2024-03-31 DIAGNOSIS — M25552 Pain in left hip: Secondary | ICD-10-CM | POA: Insufficient documentation

## 2024-03-31 DIAGNOSIS — M6281 Muscle weakness (generalized): Secondary | ICD-10-CM | POA: Insufficient documentation

## 2024-03-31 DIAGNOSIS — M5459 Other low back pain: Secondary | ICD-10-CM | POA: Diagnosis not present

## 2024-03-31 DIAGNOSIS — R262 Difficulty in walking, not elsewhere classified: Secondary | ICD-10-CM | POA: Diagnosis not present

## 2024-03-31 NOTE — Therapy (Signed)
 OUTPATIENT PHYSICAL THERAPY THORACOLUMBAR TREATMENT   Patient Name: Margaret Graham MRN: [id number] DOB:January 01, 1946, 78 y.o., female Today's Date: 04/01/2024  END OF SESSION:  PT End of Session - 03/31/24 1153     Visit Number 8    Number of Visits 20    Authorization Type MCR A and B    PT Start Time 1150    PT Stop Time 1235    PT Time Calculation (min) 45 min    Activity Tolerance Patient tolerated treatment well    Behavior During Therapy WFL for tasks assessed/performed                 Past Medical History:  Diagnosis Date   Arthritis    GERD (gastroesophageal reflux disease)    Heart murmur    Hyperlipidemia    Hypothyroidism    Internal hemorrhoid    Osteopenia    Schatzki's ring    Sleep apnea    does not use a cpap machine   Thyroid  disease    Thyroid  nodule    right 1.7 cm   Tubular adenoma    Past Surgical History:  Procedure Laterality Date   ABDOMINAL HYSTERECTOMY     BREAST SURGERY     for dilated duct   CATARACT EXTRACTION Bilateral    GLUTEUS MINIMUS REPAIR Left 02/12/2023   Procedure: LEFT GLUTEUS MEDIUS REPAIR WITH POSSIBLE COLLAGEN PATCH PLACEMENT;  Surgeon: Wilhelmenia Harada, MD;  Location: [facility];  Service: Orthopedics;  Laterality: Left;   left thyroid  lobectomy  2004   MYRINGOTOMY WITH TUBE PLACEMENT Left 12/19/2022   Procedure: MYRINGOTOMY WITH TUBE PLACEMENT;  Surgeon: Daleen Dubs, DO;  Location: [facility];  Service: ENT;  Laterality: Left;   Patient Active Problem List   Diagnosis Date Noted   S/P lumbar fusion 01/17/2024   Tear of left gluteus medius tendon 02/12/2023   Strain of gluteus medius 02/06/2023   Chronic serous otitis media 12/19/2022   Osteopenia 01/23/2016   Dyslipidemia 01/18/2016   History of esophageal stricture 01/18/2016   Postoperative hypothyroidism 01/18/2016   Esophageal reflux 01/18/2016   Snoring 01/18/2016     REFERRING PROVIDER: Jeannette Mills,  NP  Surgeon:  Joaquin Mulberry, MD  REFERRING DIAG: M43.16 (ICD-10-CM) - Spondylolisthesis, lumbar region  Rationale for Evaluation and Treatment: Rehabilitation  THERAPY DIAG:  Other low back pain  Muscle weakness (generalized)  ONSET DATE: DOS 01/17/2024  SUBJECTIVE:  SUBJECTIVE STATEMENT: Pt denies any adverse effects after prior Rx.  Pt states she the rolling last Rx helped.     PERTINENT HISTORY:  -decompressive lumbar laminectomy, hemi facetectomy and foraminotomies L3-4 L4-5, Posterior lumbar interbody fusion L3-4 L4-5, Posterior fixation L3-L5, and Intertransverse arthrodesis L3-L5 on 01/17/24.  B / L / T restrictions though pt states MD stated it was ok for her to lean over and pick something up.   -L hip Gluteus medius repair and trochanteric bursectomy on 02/12/2023 which she may need to have it redone.  Pt still has L hip pain. -arthritis  PAIN:  NPRS:  4/10 current at rest, 5-6/10 pain with ambulation, 8-9/10 worst Worst pain is when she walks too far.  Location:  L sided lumbar > R side and central lumbar  Easing factors:  sitting, lying  PRECAUTIONS: Back   WEIGHT BEARING RESTRICTIONS: lifting restrictions  FALLS:  Has patient fallen in last 6 months?   LIVING ENVIRONMENT: Lives with: lives with their spouse   OCCUPATION: Pt is retired  PLOF: Independent.   Pt has been using a SPC since hip surgery April 2024.  PATIENT GOALS: to be able to walk as long as I want to  NEXT MD VISIT:   OBJECTIVE:  Note: Objective measures were completed at Evaluation unless otherwise noted.  DIAGNOSTIC FINDINGS:  Pt is post op.  She had a MRI prior to surgery.  PATIENT SURVEYS:  Modified Oswestry 36%   COGNITION: Overall cognitive status: Within functional limits for tasks  assessed     SENSATION: L2-S2 WFL to LT bilat   LUMBAR ROM:   Not tested due to contraindicated for surgical protocol and post op healing   LOWER EXTREMITY MMT:   Strength not tested due to healing constraints and post op restrictions   GAIT: Level of assistance: Independent Comments:  Pt ambulates with a heel to toe gait with SPC on R.  Pt doesn't perform arm swing on L  TREATMENT:                                                                                                                               6/3 Pt ambulated 3 laps with SPC  Supine bridge with TrA 2x10 Supine alt UE/LE with TrA 2x10 Seated hip abd with RTB 2x10 Seated Biceps curls with 2# 3x10 Standing rows with RTB 2x10  Manual Therapy:  Gentle STM and very gentle rolling with the roller to bilat proximal ant thighs and quads   5/30 Manual:  Roller to anterior hip Trigger point release to anterior hip  Review of self trigger point release  There-ex:  Quad set 3x10  LTR 3x10   Neuro re-ed:  Punches 2lbs 3x10  Bicep curl with posture 3x10 2lbs       5/28 Supine march with TrA 2x10 Supine SLR with TrA 2x10 Supine bridge with TrA 2x10 Attempted supine manual HS stretch Sit to stand with TrA  3x5 Seated hip abd with RTB 2x10 Standing rows with TrA 2x10 with RTB Standing shoulder extension with TrA 2x10 with RTB  Pt ambulated 3 laps with [facility]  5/23 There-ex:  Ball roll fwd and lateral 2x10 each  Nu-step 5 min reviewed set up L 3   Neuro-re-ed Ball press 2x10  Hip abduction with band 2x10  Row with posture 2x10 red  Shoulder extension with posture 2x10   There-act:  Step up 2 inch 2x10   Lateral step up 2x10  Sit to stand transfer 3x5 from lowest table position     5/20 PT instructed pt in correct form and palpation of TrA contraction.  PT performed TrA contractions with cuing and instruction. Supine march with TrA x10, 1x5 Supine SLR with TrA 2x5 Supine manual HS stretch  2x30 sec bilat Seated hip abd withYTB 3x10 LAQ 2x10 Rows with TrA with YTB 2x10  Pt ambulated 2 laps with mostly with [facility]     PATIENT EDUCATION:  Education details:  HEP, exercise form, rationale of interventions, relevant anatomy, and POC.  PT answered pt's questions. Person educated: Patient Education method:  Explanation, Demonstration, Tactile cues, Verbal cues Education comprehension: verbalized understanding, returned demonstration, verbal cues required, tactile cues required, and needs further education  HOME EXERCISE PROGRAM: Access Code: [email or web address] URL: [email or web address] Date: 02/13/2024 Prepared by: Marnie Siren  Exercises - Log Roll  - Supine Gluteal Sets  - 2 x daily - 7 x weekly - 2 sets - 10 reps - 5 secons hold - Supine Transversus Abdominis Bracing - Hands on Stomach  - 2 x daily - 7 x weekly - 2 sets - 10 reps - Supine Single Leg Ankle Pumps  - 2-3 x daily - 7 x weekly - 2 sets - 10 reps  ASSESSMENT:  CLINICAL IMPRESSION: Pt performed exercises per protocol well.  She required cuing for sequencing with supine alt UE/LE and demonstrated improved performance with increased reps and cuing.  She tolerated exercises well without c/o's.  Pt has tightness in bilat quads.  Pt is tender at R mid thigh and L proximal thigh.  PT performed STM and rolling very lightly due to tenderness.  She responded well to Rx stating she felt better after Rx with pain improving to 3/10.  Pt should benefit from cont skilled PT to improve pain, tolerance to activity, core/postural/LE strength, and functional mobility.    OBJECTIVE IMPAIRMENTS: decreased activity tolerance, decreased endurance, decreased mobility, difficulty walking, decreased strength, and pain.   ACTIVITY LIMITATIONS: carrying, lifting, bending, standing, squatting, stairs, transfers, bed mobility, and locomotion level  PARTICIPATION LIMITATIONS: meal prep, cleaning, laundry, shopping, and community  activity  PERSONAL FACTORS: Time since onset of injury/illness/exacerbation and 1 comorbidity: L hip Gluteus medius repair and continued L hi pain are also affecting patient's functional outcome.   REHAB POTENTIAL: Good  CLINICAL DECISION MAKING: Stable/uncomplicated  EVALUATION COMPLEXITY: Low   GOALS:  SHORT TERM GOALS: Target date:  03/12/2024    Pt will be independent and compliant with HEP for improved pain, strength, function, and mobility.   Baseline: Goal status: progressing 5/16  2.  Pt will report at least a 25% improvement in ambulation.  Baseline:  Goal status: INITIAL  3.  Pt will progress with walking program without adverse effects. Baseline:  Goal status: still using walker 5/16 4.  Pt will progress with exercises per protocol without adverse effects for improved core and LE strength in order to for improved tolerance and ease with daily activities  and functional mobility.  Baseline:  Goal status: INITIAL Target date:  04/09/2024   LONG TERM GOALS: Target date: 05/07/2024  Pt will report she is able to ambulate extended community distance without increased back pain.  Baseline:  Goal status: INITIAL  2.  Pt will report she is able to perform household chores with expected limitations without significant pain and difficulty.  Baseline:  Goal status: INITIAL  3.  Pt will report good standing tolerance with her ADL's, IADL's, and community mobility.  Baseline:  Goal status: INITIAL  4.  Pt will demonstrate at least a 12% improvement in modified ODI for clinically significant improvement in self perceived disability.  Baseline:  Goal status: INITIAL  5.  Pt will be independent with advanced HEP in order to have a consistent exercise routine for improved core and LE strength for good tolerance and performance of ADLs/IADLs and functional mobility.   Baseline:  Goal status: INITIAL    PLAN:  PT FREQUENCY: 1x/wk x 3-4 weeks and 2x/wk afterwards  PT  DURATION: 12 weeks  PLANNED INTERVENTIONS: 97164- PT Re-evaluation, 97750- Physical Performance Testing, 97110-Therapeutic exercises, 97530- Therapeutic activity, 97112- Neuromuscular re-education, 97535- Self Care, 16109- Manual therapy, [phone]- Gait training, [phone]- Aquatic Therapy, [phone]- Electrical stimulation (unattended), Patient/Family education, Balance training, Stair training, Taping, Dry Needling, Joint mobilization, Scar mobilization, DME instructions, Cryotherapy, and Moist heat.  PLAN FOR NEXT SESSION: Cont per lumbar fusion protocol.    Trina Fujita III PT, DPT 04/01/24 11:05 PM

## 2024-04-01 ENCOUNTER — Encounter (HOSPITAL_BASED_OUTPATIENT_CLINIC_OR_DEPARTMENT_OTHER): Payer: Self-pay | Admitting: Physical Therapy

## 2024-04-01 DIAGNOSIS — L814 Other melanin hyperpigmentation: Secondary | ICD-10-CM | POA: Diagnosis not present

## 2024-04-01 DIAGNOSIS — D1801 Hemangioma of skin and subcutaneous tissue: Secondary | ICD-10-CM | POA: Diagnosis not present

## 2024-04-01 DIAGNOSIS — L720 Epidermal cyst: Secondary | ICD-10-CM | POA: Diagnosis not present

## 2024-04-01 DIAGNOSIS — D692 Other nonthrombocytopenic purpura: Secondary | ICD-10-CM | POA: Diagnosis not present

## 2024-04-01 DIAGNOSIS — L570 Actinic keratosis: Secondary | ICD-10-CM | POA: Diagnosis not present

## 2024-04-01 DIAGNOSIS — L821 Other seborrheic keratosis: Secondary | ICD-10-CM | POA: Diagnosis not present

## 2024-04-03 ENCOUNTER — Other Ambulatory Visit: Payer: Self-pay

## 2024-04-03 ENCOUNTER — Ambulatory Visit (HOSPITAL_BASED_OUTPATIENT_CLINIC_OR_DEPARTMENT_OTHER): Admitting: Physical Therapy

## 2024-04-03 ENCOUNTER — Encounter (HOSPITAL_BASED_OUTPATIENT_CLINIC_OR_DEPARTMENT_OTHER): Payer: Self-pay | Admitting: Physical Therapy

## 2024-04-03 DIAGNOSIS — M5459 Other low back pain: Secondary | ICD-10-CM

## 2024-04-03 DIAGNOSIS — R262 Difficulty in walking, not elsewhere classified: Secondary | ICD-10-CM | POA: Diagnosis not present

## 2024-04-03 DIAGNOSIS — M4316 Spondylolisthesis, lumbar region: Secondary | ICD-10-CM | POA: Diagnosis not present

## 2024-04-03 DIAGNOSIS — M6281 Muscle weakness (generalized): Secondary | ICD-10-CM

## 2024-04-03 DIAGNOSIS — M25552 Pain in left hip: Secondary | ICD-10-CM | POA: Diagnosis not present

## 2024-04-03 NOTE — Therapy (Signed)
 OUTPATIENT PHYSICAL THERAPY THORACOLUMBAR TREATMENT   Patient Name: Margaret Graham MRN: [id number] DOB:03-18-1946, 78 y.o., female Today's Date: 04/03/2024  END OF SESSION:  PT End of Session - 04/03/24 1146     Visit Number 9    Number of Visits 10    Date for PT Re-Evaluation 05/07/24    Authorization Type MCR A and B    PT Start Time 1120    PT Stop Time 1200    PT Time Calculation (min) 40 min    Activity Tolerance Patient tolerated treatment well    Behavior During Therapy WFL for tasks assessed/performed                  Past Medical History:  Diagnosis Date   Arthritis    GERD (gastroesophageal reflux disease)    Heart murmur    Hyperlipidemia    Hypothyroidism    Internal hemorrhoid    Osteopenia    Schatzki's ring    Sleep apnea    does not use a cpap machine   Thyroid  disease    Thyroid  nodule    right 1.7 cm   Tubular adenoma    Past Surgical History:  Procedure Laterality Date   ABDOMINAL HYSTERECTOMY     BREAST SURGERY     for dilated duct   CATARACT EXTRACTION Bilateral    GLUTEUS MINIMUS REPAIR Left 02/12/2023   Procedure: LEFT GLUTEUS MEDIUS REPAIR WITH POSSIBLE COLLAGEN PATCH PLACEMENT;  Surgeon: Wilhelmenia Harada, MD;  Location: [facility];  Service: Orthopedics;  Laterality: Left;   left thyroid  lobectomy  2004   MYRINGOTOMY WITH TUBE PLACEMENT Left 12/19/2022   Procedure: MYRINGOTOMY WITH TUBE PLACEMENT;  Surgeon: Daleen Dubs, DO;  Location: [facility];  Service: ENT;  Laterality: Left;   Patient Active Problem List   Diagnosis Date Noted   S/P lumbar fusion 01/17/2024   Tear of left gluteus medius tendon 02/12/2023   Strain of gluteus medius 02/06/2023   Chronic serous otitis media 12/19/2022   Osteopenia 01/23/2016   Dyslipidemia 01/18/2016   History of esophageal stricture 01/18/2016   Postoperative hypothyroidism 01/18/2016   Esophageal reflux 01/18/2016   Snoring 01/18/2016     REFERRING  PROVIDER: Jeannette Mills, NP  Surgeon:  Joaquin Mulberry, MD  REFERRING DIAG: M43.16 (ICD-10-CM) - Spondylolisthesis, lumbar region  Rationale for Evaluation and Treatment: Rehabilitation  THERAPY DIAG:  Other low back pain  Muscle weakness (generalized)  ONSET DATE: DOS 01/17/2024  SUBJECTIVE:  SUBJECTIVE STATEMENT: Pt is 11 weeks post op.  Pt states she is able to do more though still has pain.  "I am doing so much better.  I know I am."  Pt states her walking is better.  Pt sees surgeon today.  Pt thinks she felt fine after prior Rx.     PERTINENT HISTORY:  -decompressive lumbar laminectomy, hemi facetectomy and foraminotomies L3-4 L4-5, Posterior lumbar interbody fusion L3-4 L4-5, Posterior fixation L3-L5, and Intertransverse arthrodesis L3-L5 on 01/17/24.  B / L / T restrictions though pt states MD stated it was ok for her to lean over and pick something up.   -L hip Gluteus medius repair and trochanteric bursectomy on 02/12/2023 which she may need to have it redone.  Pt still has L hip pain. -arthritis  PAIN:  NPRS:  0/10 pain, 1/10 pain with ambulation, 8-9/10 worst Worst pain is when she walks too far.  Location:  L sided lumbar > R side and central lumbar  Easing factors:  sitting, lying  PRECAUTIONS: Back   WEIGHT BEARING RESTRICTIONS: lifting restrictions  FALLS:  Has patient fallen in last 6 months?   LIVING ENVIRONMENT: Lives with: lives with their spouse   OCCUPATION: Pt is retired  PLOF: Independent.   Pt has been using a SPC since hip surgery April 2024.  PATIENT GOALS: to be able to walk as long as I want to  NEXT MD VISIT:   OBJECTIVE:  Note: Objective measures were completed at Evaluation unless otherwise noted.  DIAGNOSTIC FINDINGS:  Pt is post op.   She had a MRI prior to surgery.  PATIENT SURVEYS:  Modified Oswestry 36%   COGNITION: Overall cognitive status: Within functional limits for tasks assessed     SENSATION: L2-S2 WFL to LT bilat   LUMBAR ROM:   Not tested due to contraindicated for surgical protocol and post op healing   LOWER EXTREMITY MMT:   Strength not tested due to healing constraints and post op restrictions   GAIT: Level of assistance: Independent Comments:  Pt ambulates with a heel to toe gait with SPC on R.  Pt doesn't perform arm swing on L  TREATMENT:                                                                                                                               6/6 Pt ambulated 3 laps with SPC  Supine bridge with TrA 2x10 Supine alt UE/LE with TrA 2x10 Seated Biceps curls with 2# 3x10 Standing rows with RTB 2x10 with TrA Standing shoulder extension with YTB 2x10 with TrA Step ups on 2 inch step with TrA x 10 bilat Standing heel raises with TrA 2x10  6/3 Pt ambulated 3 laps with SPC  Supine bridge with TrA 2x10 Supine alt UE/LE with TrA 2x10 Seated hip abd with RTB 2x10 Seated Biceps curls with 2# 3x10 Standing rows with RTB 2x10  Manual Therapy:  Gentle STM and  very gentle rolling with the roller to bilat proximal ant thighs and quads   5/30 Manual:  Roller to anterior hip Trigger point release to anterior hip  Review of self trigger point release  There-ex:  Quad set 3x10  LTR 3x10   Neuro re-ed:  Punches 2lbs 3x10  Bicep curl with posture 3x10 2lbs       5/28 Supine march with TrA 2x10 Supine SLR with TrA 2x10 Supine bridge with TrA 2x10 Attempted supine manual HS stretch Sit to stand with TrA  3x5 Seated hip abd with RTB 2x10 Standing rows with TrA 2x10 with RTB Standing shoulder extension with TrA 2x10 with RTB  Pt ambulated 3 laps with [facility]  5/23 There-ex:  Ball roll fwd and lateral 2x10 each  Nu-step 5 min reviewed set up L 3    Neuro-re-ed Ball press 2x10  Hip abduction with band 2x10  Row with posture 2x10 red  Shoulder extension with posture 2x10   There-act:  Step up 2 inch 2x10   Lateral step up 2x10  Sit to stand transfer 3x5 from lowest table position     5/20 PT instructed pt in correct form and palpation of TrA contraction.  PT performed TrA contractions with cuing and instruction. Supine march with TrA x10, 1x5 Supine SLR with TrA 2x5 Supine manual HS stretch 2x30 sec bilat Seated hip abd withYTB 3x10 LAQ 2x10 Rows with TrA with YTB 2x10  Pt ambulated 2 laps with mostly with [facility]     PATIENT EDUCATION:  Education details:  HEP, exercise form, rationale of interventions, relevant anatomy, and POC.  PT answered pt's questions. Person educated: Patient Education method:  Explanation, Demonstration, Tactile cues, Verbal cues Education comprehension: verbalized understanding, returned demonstration, verbal cues required, tactile cues required, and needs further education  HOME EXERCISE PROGRAM: Access Code: [email or web address] URL: [email or web address] Date: 02/13/2024 Prepared by: Marnie Siren  Exercises - Log Roll  - Supine Gluteal Sets  - 2 x daily - 7 x weekly - 2 sets - 10 reps - 5 secons hold - Supine Transversus Abdominis Bracing - Hands on Stomach  - 2 x daily - 7 x weekly - 2 sets - 10 reps - Supine Single Leg Ankle Pumps  - 2-3 x daily - 7 x weekly - 2 sets - 10 reps  ASSESSMENT:  CLINICAL IMPRESSION: Pt continues to have pain though states she is doing better. Pt has improved with gait.  She was able to take a few steps in the clinic without cane without pain.  Pt performed exercises per protocol well with cuing and instruction in correct form.  Pt reported some L sided lower back pain with L LE step ups though no pain with R LE step ups.  Pt is improving with tolerance to exercises.  Pt responded well to Rx reporting no increased pain after Rx.  Pt should benefit from  cont skilled PT to improve pain, tolerance to activity, core/postural/LE strength, and functional mobility.    OBJECTIVE IMPAIRMENTS: decreased activity tolerance, decreased endurance, decreased mobility, difficulty walking, decreased strength, and pain.   ACTIVITY LIMITATIONS: carrying, lifting, bending, standing, squatting, stairs, transfers, bed mobility, and locomotion level  PARTICIPATION LIMITATIONS: meal prep, cleaning, laundry, shopping, and community activity  PERSONAL FACTORS: Time since onset of injury/illness/exacerbation and 1 comorbidity: L hip Gluteus medius repair and continued L hi pain are also affecting patient's functional outcome.   REHAB POTENTIAL: Good  CLINICAL DECISION MAKING: Stable/uncomplicated  EVALUATION COMPLEXITY: Low   GOALS:  SHORT TERM GOALS: Target date:  03/12/2024    Pt will be independent and compliant with HEP for improved pain, strength, function, and mobility.   Baseline: Goal status: progressing 5/16  2.  Pt will report at least a 25% improvement in ambulation.  Baseline:  Goal status: INITIAL  3.  Pt will progress with walking program without adverse effects. Baseline:  Goal status: still using walker 5/16 4.  Pt will progress with exercises per protocol without adverse effects for improved core and LE strength in order to for improved tolerance and ease with daily activities and functional mobility.  Baseline:  Goal status: INITIAL Target date:  04/09/2024   LONG TERM GOALS: Target date: 05/07/2024  Pt will report she is able to ambulate extended community distance without increased back pain.  Baseline:  Goal status: INITIAL  2.  Pt will report she is able to perform household chores with expected limitations without significant pain and difficulty.  Baseline:  Goal status: INITIAL  3.  Pt will report good standing tolerance with her ADL's, IADL's, and community mobility.  Baseline:  Goal status: INITIAL  4.  Pt will  demonstrate at least a 12% improvement in modified ODI for clinically significant improvement in self perceived disability.  Baseline:  Goal status: INITIAL  5.  Pt will be independent with advanced HEP in order to have a consistent exercise routine for improved core and LE strength for good tolerance and performance of ADLs/IADLs and functional mobility.   Baseline:  Goal status: INITIAL    PLAN:  PT FREQUENCY: 1x/wk x 3-4 weeks and 2x/wk afterwards  PT DURATION: 12 weeks  PLANNED INTERVENTIONS: 97164- PT Re-evaluation, 97750- Physical Performance Testing, 97110-Therapeutic exercises, 97530- Therapeutic activity, 97112- Neuromuscular re-education, 97535- Self Care, 24401- Manual therapy, [phone]- Gait training, [phone]- Aquatic Therapy, [phone]- Electrical stimulation (unattended), Patient/Family education, Balance training, Stair training, Taping, Dry Needling, Joint mobilization, Scar mobilization, DME instructions, Cryotherapy, and Moist heat.  PLAN FOR NEXT SESSION: Cont per lumbar fusion protocol.  PN next visit.   Trina Fujita III PT, DPT 04/03/24 12:20 PM

## 2024-04-07 ENCOUNTER — Encounter (HOSPITAL_BASED_OUTPATIENT_CLINIC_OR_DEPARTMENT_OTHER): Admitting: Physical Therapy

## 2024-04-08 ENCOUNTER — Encounter (HOSPITAL_BASED_OUTPATIENT_CLINIC_OR_DEPARTMENT_OTHER): Payer: Self-pay | Admitting: Physical Therapy

## 2024-04-08 ENCOUNTER — Ambulatory Visit (HOSPITAL_BASED_OUTPATIENT_CLINIC_OR_DEPARTMENT_OTHER): Admitting: Physical Therapy

## 2024-04-10 ENCOUNTER — Encounter (HOSPITAL_BASED_OUTPATIENT_CLINIC_OR_DEPARTMENT_OTHER): Payer: Self-pay | Admitting: Physical Therapy

## 2024-04-10 ENCOUNTER — Ambulatory Visit (HOSPITAL_BASED_OUTPATIENT_CLINIC_OR_DEPARTMENT_OTHER): Admitting: Physical Therapy

## 2024-04-10 DIAGNOSIS — M25552 Pain in left hip: Secondary | ICD-10-CM | POA: Diagnosis not present

## 2024-04-10 DIAGNOSIS — M5459 Other low back pain: Secondary | ICD-10-CM

## 2024-04-10 DIAGNOSIS — M6281 Muscle weakness (generalized): Secondary | ICD-10-CM | POA: Diagnosis not present

## 2024-04-10 DIAGNOSIS — R262 Difficulty in walking, not elsewhere classified: Secondary | ICD-10-CM | POA: Diagnosis not present

## 2024-04-10 NOTE — Therapy (Signed)
 OUTPATIENT PHYSICAL THERAPY THORACOLUMBAR TREATMENT  Progress Note Reporting Period 02/13/2024 to 04/10/2024  See note below for Objective Data and Assessment of Progress/Goals.       Patient Name: Margaret Graham MRN: [id number] DOB:[birth year], 78 y.o., female Today's Date: 04/10/2024  END OF SESSION:  PT End of Session - 04/10/24 1119     Visit Number 10    Number of Visits 20    Date for PT Re-Evaluation 05/07/24    Authorization Type MCR A and B    PT Start Time 1116    PT Stop Time 1200    PT Time Calculation (min) 44 min    Activity Tolerance Patient tolerated treatment well    Behavior During Therapy WFL for tasks assessed/performed               Past Medical History:  Diagnosis Date   Arthritis    GERD (gastroesophageal reflux disease)    Heart murmur    Hyperlipidemia    Hypothyroidism    Internal hemorrhoid    Osteopenia    Schatzki's ring    Sleep apnea    does not use a cpap machine   Thyroid  disease    Thyroid  nodule    right 1.7 cm   Tubular adenoma    Past Surgical History:  Procedure Laterality Date   ABDOMINAL HYSTERECTOMY     BREAST SURGERY     for dilated duct   CATARACT EXTRACTION Bilateral    GLUTEUS MINIMUS REPAIR Left 02/12/2023   Procedure: LEFT GLUTEUS MEDIUS REPAIR WITH POSSIBLE COLLAGEN PATCH PLACEMENT;  Surgeon: Wilhelmenia Harada, MD;  Location: [facility];  Service: Orthopedics;  Laterality: Left;   left thyroid  lobectomy  2004   MYRINGOTOMY WITH TUBE PLACEMENT Left 12/19/2022   Procedure: MYRINGOTOMY WITH TUBE PLACEMENT;  Surgeon: Daleen Dubs, DO;  Location: [facility];  Service: ENT;  Laterality: Left;   Patient Active Problem List   Diagnosis Date Noted   S/P lumbar fusion 01/17/2024   Tear of left gluteus medius tendon 02/12/2023   Strain of gluteus medius 02/06/2023   Chronic serous otitis media 12/19/2022   Osteopenia 01/23/2016   Dyslipidemia 01/18/2016   History of esophageal stricture  01/18/2016   Postoperative hypothyroidism 01/18/2016   Esophageal reflux 01/18/2016   Snoring 01/18/2016     REFERRING PROVIDER: Jeannette Mills, NP  Surgeon:  Joaquin Mulberry, MD  REFERRING DIAG: M43.16 (ICD-10-CM) - Spondylolisthesis, lumbar region  Rationale for Evaluation and Treatment: Rehabilitation  THERAPY DIAG:  Other low back pain  Muscle weakness (generalized)  ONSET DATE: DOS 01/17/2024  SUBJECTIVE:  SUBJECTIVE STATEMENT: Pt is 12 weeks post op.  Pt states she is able to do more though still has pain.  I am doing so much better.  I know I am.  Pt states her walking is better.  Pt sees surgeon today.  Pt thinks she felt fine after prior Rx.    Pt states her walking has improved.  I can walk a lot longer.  Pt states she is a whole lot better, and still has an area of pain in L sided lumbar.  Pt reports a 60% improvement in ambulation.  Pt reports she has pain 1st thing in AM.  Pt has pain at night if she has done a lot during the day.    PERTINENT HISTORY:  -decompressive lumbar laminectomy, hemi facetectomy and foraminotomies L3-4 L4-5, Posterior lumbar interbody fusion L3-4 L4-5, Posterior fixation L3-L5, and Intertransverse arthrodesis L3-L5 on 01/17/24.  B / L / T restrictions though pt states MD stated it was ok for her to lean over and pick something up.   -L hip Gluteus medius repair and trochanteric bursectomy on 02/12/2023 which she may need to have it redone.  Pt still has L hip pain. -arthritis  PAIN:  NPRS:  0/10 pain, 5/10 worst Worst pain is 1st thing in AM or late at night. Location:  L sided lumbar > R side and central lumbar  Easing factors:  sitting, lying  PRECAUTIONS: Back   WEIGHT BEARING RESTRICTIONS: lifting restrictions  FALLS:  Has patient  fallen in last 6 months?   LIVING ENVIRONMENT: Lives with: lives with their spouse   OCCUPATION: Pt is retired  PLOF: Independent.   Pt has been using a SPC since hip surgery April 2024.  PATIENT GOALS: to be able to walk as long as I want to  NEXT MD VISIT:   OBJECTIVE:  Note: Objective measures were completed at Evaluation unless otherwise noted.  DIAGNOSTIC FINDINGS:  Pt is post op.  She had a MRI prior to surgery.  PATIENT SURVEYS:  Modified Oswestry 36%   COGNITION: Overall cognitive status: Within functional limits for tasks assessed     SENSATION: L2-S2 WFL to LT bilat   LUMBAR ROM:   Not tested due to contraindicated for surgical protocol and post op healing   LOWER EXTREMITY MMT:   Strength not tested due to healing constraints and post op restrictions   GAIT: Level of assistance: Independent Comments:  Pt ambulates with a heel to toe gait with SPC on R.  Pt doesn't perform arm swing on L  TREATMENT:                                                                                                                               6/13  Reviewed current function, pain levels, and HEP compliance.  LE STRENGTH (MMT): Hip flexion 5/5 bilat Knee extension  5/5 bilat Knee flexion (seated)  5/5 bilat DF 5/5 bilat PF  [facility]  bilat (seated)  Supine bridge 2x10 with TrA  Supine alt UE/LE with TrA 2x10 Standing heel raises with TrA 2x10 Step ups with TrA on 2 inch and 4 inch step x 5 reps bilat each Standing rows with RTB with TrA 2x10 Standing shoulder extension with RTB with TrA 2x10 Standing biceps curls with 2# 2x10  Modified Oswestry:  Initial/Current:  36% / 20%    6/6 Pt ambulated 3 laps with SPC  Supine bridge with TrA 2x10 Supine alt UE/LE with TrA 2x10 Seated Biceps curls with 2# 3x10 Standing rows with RTB 2x10 with TrA Standing shoulder extension with YTB 2x10 with TrA Step ups on 2 inch step with TrA x 10 bilat Standing heel raises with TrA  2x10  6/3 Pt ambulated 3 laps with SPC  Supine bridge with TrA 2x10 Supine alt UE/LE with TrA 2x10 Seated hip abd with RTB 2x10 Seated Biceps curls with 2# 3x10 Standing rows with RTB 2x10  Manual Therapy:  Gentle STM and very gentle rolling with the roller to bilat proximal ant thighs and quads   5/30 Manual:  Roller to anterior hip Trigger point release to anterior hip  Review of self trigger point release  There-ex:  Quad set 3x10  LTR 3x10   Neuro re-ed:  Punches 2lbs 3x10  Bicep curl with posture 3x10 2lbs       5/28 Supine march with TrA 2x10 Supine SLR with TrA 2x10 Supine bridge with TrA 2x10 Attempted supine manual HS stretch Sit to stand with TrA  3x5 Seated hip abd with RTB 2x10 Standing rows with TrA 2x10 with RTB Standing shoulder extension with TrA 2x10 with RTB  Pt ambulated 3 laps with [facility]  5/23 There-ex:  Ball roll fwd and lateral 2x10 each  Nu-step 5 min reviewed set up L 3   Neuro-re-ed Ball press 2x10  Hip abduction with band 2x10  Row with posture 2x10 red  Shoulder extension with posture 2x10   There-act:  Step up 2 inch 2x10   Lateral step up 2x10  Sit to stand transfer 3x5 from lowest table position     5/20 PT instructed pt in correct form and palpation of TrA contraction.  PT performed TrA contractions with cuing and instruction. Supine march with TrA x10, 1x5 Supine SLR with TrA 2x5 Supine manual HS stretch 2x30 sec bilat Seated hip abd withYTB 3x10 LAQ 2x10 Rows with TrA with YTB 2x10  Pt ambulated 2 laps with mostly with [facility]     PATIENT EDUCATION:  Education details:  HEP, exercise form, rationale of interventions, relevant anatomy, and POC.  PT answered pt's questions. Person educated: Patient Education method:  Explanation, Demonstration, Tactile cues, Verbal cues Education comprehension: verbalized understanding, returned demonstration, verbal cues required, tactile cues required, and needs further  education  HOME EXERCISE PROGRAM: Access Code: [email or web address] URL: [email or web address] Date: 02/13/2024 Prepared by: Marnie Siren  Exercises - Log Roll  - Supine Gluteal Sets  - 2 x daily - 7 x weekly - 2 sets - 10 reps - 5 secons hold - Supine Transversus Abdominis Bracing - Hands on Stomach  - 2 x daily - 7 x weekly - 2 sets - 10 reps - Supine Single Leg Ankle Pumps  - 2-3 x daily - 7 x weekly - 2 sets - 10 reps  ASSESSMENT:  CLINICAL IMPRESSION: Pt is making good progress in all areas.  Pt is progressing appropriately with lumbar fusion protocol.  She is improving with core strength and  demonstrates 5/5 strength in bilat hip hip flex, knee flex/ext, and DF.  Pt is improving with gait and is ambulating with cane.  Pt reports a 60% improvement in ambulation.  Pt performed exercises well and had good tolerance with exercises.  Pt can feel her back and L hip with step ups.  Pt demonstrates clinically significant improvement in self perceived disability with Modified Oswestry score improving from 36% prior to 20% currently.  Pt has met all STG's and LTG #4.  Pt should benefit from cont skilled PT to improve pain, tolerance to activity, core/postural/LE strength, and functional mobility.       OBJECTIVE IMPAIRMENTS: decreased activity tolerance, decreased endurance, decreased mobility, difficulty walking, decreased strength, and pain.   ACTIVITY LIMITATIONS: carrying, lifting, bending, standing, squatting, stairs, transfers, bed mobility, and locomotion level  PARTICIPATION LIMITATIONS: meal prep, cleaning, laundry, shopping, and community activity  PERSONAL FACTORS: Time since onset of injury/illness/exacerbation and 1 comorbidity: L hip Gluteus medius repair and continued L hi pain are also affecting patient's functional outcome.   REHAB POTENTIAL: Good  CLINICAL DECISION MAKING: Stable/uncomplicated  EVALUATION COMPLEXITY: Low   GOALS:  SHORT TERM GOALS: Target date:   03/12/2024    Pt will be independent and compliant with HEP for improved pain, strength, function, and mobility.   Baseline: Goal status: GOAL MET  04/10/24  2.  Pt will report at least a 25% improvement in ambulation.  Baseline:  Goal status: GOAL MET 04/10/24  3.  Pt will progress with walking program without adverse effects. Baseline:  Goal status: GOAL MET  04/10/24  4.  Pt will progress with exercises per protocol without adverse effects for improved core and LE strength in order to for improved tolerance and ease with daily activities and functional mobility.  Baseline:  Goal status: GOAL MET  6/13 Target date:  04/09/2024   LONG TERM GOALS: Target date: 05/07/2024  Pt will report she is able to ambulate extended community distance without increased back pain.  Baseline:  Goal status: PROGRESSING  2.  Pt will report she is able to perform household chores with expected limitations without significant pain and difficulty.  Baseline:  Goal status: ONGOING  3.  Pt will report good standing tolerance with her ADL's, IADL's, and community mobility.  Baseline:  Goal status: OBGOING  4.  Pt will demonstrate at least a 12% improvement in modified ODI for clinically significant improvement in self perceived disability.  Baseline:  Goal status: GOAL MET  6/13  5.  Pt will be independent with advanced HEP in order to have a consistent exercise routine for improved core and LE strength for good tolerance and performance of ADLs/IADLs and functional mobility.   Baseline:  Goal status: ONGOING    PLAN:  PT FREQUENCY: 2x/wk  PT DURATION: 4 weeks  PLANNED INTERVENTIONS: 97164- PT Re-evaluation, 97750- Physical Performance Testing, 97110-Therapeutic exercises, 97530- Therapeutic activity, [id number]- Neuromuscular re-education, 97535- Self Care, 16109- Manual therapy, [phone]- Gait training, [phone]- Aquatic Therapy, [phone]- Electrical stimulation (unattended), Patient/Family education,  Balance training, Stair training, Taping, Dry Needling, Joint mobilization, Scar mobilization, DME instructions, Cryotherapy, and Moist heat.  PLAN FOR NEXT SESSION: Cont per lumbar fusion protocol.     Trina Fujita III PT, DPT 04/10/24 3:38 PM

## 2024-04-14 ENCOUNTER — Encounter (HOSPITAL_BASED_OUTPATIENT_CLINIC_OR_DEPARTMENT_OTHER): Payer: Self-pay | Admitting: Physical Therapy

## 2024-04-14 ENCOUNTER — Ambulatory Visit (HOSPITAL_BASED_OUTPATIENT_CLINIC_OR_DEPARTMENT_OTHER): Admitting: Physical Therapy

## 2024-04-14 DIAGNOSIS — M25552 Pain in left hip: Secondary | ICD-10-CM

## 2024-04-14 DIAGNOSIS — M5459 Other low back pain: Secondary | ICD-10-CM

## 2024-04-14 DIAGNOSIS — R262 Difficulty in walking, not elsewhere classified: Secondary | ICD-10-CM

## 2024-04-14 DIAGNOSIS — M6281 Muscle weakness (generalized): Secondary | ICD-10-CM

## 2024-04-14 NOTE — Therapy (Signed)
 OUTPATIENT PHYSICAL THERAPY THORACOLUMBAR TREATMENT  Progress Note Reporting Period 02/13/2024 to 04/10/2024  See note below for Objective Data and Assessment of Progress/Goals.       Patient Name: Margaret Graham MRN: [id number] DOB:1946/05/16, 78 y.o., female Today's Date: 04/14/2024  END OF SESSION:  PT End of Session - 04/14/24 1150     Visit Number 11    Number of Visits 20    Date for PT Re-Evaluation 05/07/24    Authorization Type MCR A and B    PT Start Time 1145    PT Stop Time 1228    PT Time Calculation (min) 43 min    Activity Tolerance Patient tolerated treatment well    Behavior During Therapy WFL for tasks assessed/performed               Past Medical History:  Diagnosis Date   Arthritis    GERD (gastroesophageal reflux disease)    Heart murmur    Hyperlipidemia    Hypothyroidism    Internal hemorrhoid    Osteopenia    Schatzki's ring    Sleep apnea    does not use a cpap machine   Thyroid  disease    Thyroid  nodule    right 1.7 cm   Tubular adenoma    Past Surgical History:  Procedure Laterality Date   ABDOMINAL HYSTERECTOMY     BREAST SURGERY     for dilated duct   CATARACT EXTRACTION Bilateral    GLUTEUS MINIMUS REPAIR Left 02/12/2023   Procedure: LEFT GLUTEUS MEDIUS REPAIR WITH POSSIBLE COLLAGEN PATCH PLACEMENT;  Surgeon: Wilhelmenia Harada, MD;  Location: [facility];  Service: Orthopedics;  Laterality: Left;   left thyroid  lobectomy  2004   MYRINGOTOMY WITH TUBE PLACEMENT Left 12/19/2022   Procedure: MYRINGOTOMY WITH TUBE PLACEMENT;  Surgeon: Daleen Dubs, DO;  Location: [facility];  Service: ENT;  Laterality: Left;   Patient Active Problem List   Diagnosis Date Noted   S/P lumbar fusion 01/17/2024   Tear of left gluteus medius tendon 02/12/2023   Strain of gluteus medius 02/06/2023   Chronic serous otitis media 12/19/2022   Osteopenia 01/23/2016   Dyslipidemia 01/18/2016   History of esophageal stricture  01/18/2016   Postoperative hypothyroidism 01/18/2016   Esophageal reflux 01/18/2016   Snoring 01/18/2016     REFERRING PROVIDER: Jeannette Mills, NP  Surgeon:  Joaquin Mulberry, MD  REFERRING DIAG: M43.16 (ICD-10-CM) - Spondylolisthesis, lumbar region  Rationale for Evaluation and Treatment: Rehabilitation  THERAPY DIAG:  Other low back pain  Muscle weakness (generalized)  Pain in left hip  Difficulty walking  ONSET DATE: DOS 01/17/2024  SUBJECTIVE:  SUBJECTIVE STATEMENT: Pt is 12 weeks post op.  Pt states she is able to do more though still has pain.  I am doing so much better.  I know I am.  Pt states her walking is better.  Pt sees surgeon today.  Pt thinks she felt fine after prior Rx.    Pt states her walking has improved.  I can walk a lot longer.  Pt states she is a whole lot better, and still has an area of pain in L sided lumbar.  Pt reports a 60% improvement in ambulation.  Pt reports she has pain 1st thing in AM.  Pt has pain at night if she has done a lot during the day.    PERTINENT HISTORY:  -decompressive lumbar laminectomy, hemi facetectomy and foraminotomies L3-4 L4-5, Posterior lumbar interbody fusion L3-4 L4-5, Posterior fixation L3-L5, and Intertransverse arthrodesis L3-L5 on 01/17/24.  B / L / T restrictions though pt states MD stated it was ok for her to lean over and pick something up.   -L hip Gluteus medius repair and trochanteric bursectomy on 02/12/2023 which she may need to have it redone.  Pt still has L hip pain. -arthritis  PAIN:  NPRS:  0/10 pain, 5/10 worst Worst pain is 1st thing in AM or late at night. Location:  L sided lumbar > R side and central lumbar  Easing factors:  sitting, lying  PRECAUTIONS: Back   WEIGHT BEARING RESTRICTIONS:  lifting restrictions  FALLS:  Has patient fallen in last 6 months?   LIVING ENVIRONMENT: Lives with: lives with their spouse   OCCUPATION: Pt is retired  PLOF: Independent.   Pt has been using a SPC since hip surgery April 2024.  PATIENT GOALS: to be able to walk as long as I want to  NEXT MD VISIT:   OBJECTIVE:  Note: Objective measures were completed at Evaluation unless otherwise noted.  DIAGNOSTIC FINDINGS:  Pt is post op.  She had a MRI prior to surgery.  PATIENT SURVEYS:  Modified Oswestry 36%   COGNITION: Overall cognitive status: Within functional limits for tasks assessed     SENSATION: L2-S2 WFL to LT bilat   LUMBAR ROM:   Not tested due to contraindicated for surgical protocol and post op healing   LOWER EXTREMITY MMT:   Strength not tested due to healing constraints and post op restrictions   GAIT: Level of assistance: Independent Comments:  Pt ambulates with a heel to toe gait with SPC on R.  Pt doesn't perform arm swing on L  TREATMENT:                                                                                                                               6/17 Manual:  Trigger point release to lumbar paraspinals and gluteal  Neuro-re-ed:  Supine bridge 2x10 with TrA  Supine alt UE/LE with TrA 2x10 Shoulder extension 2x10 red  Bilateral bicpes curls with  cuing for posture 2x10 2# Seated punch 2lbs 2x10   There-act:  2 inch 2x10     6/13  Reviewed current function, pain levels, and HEP compliance.  LE STRENGTH (MMT): Hip flexion 5/5 bilat Knee extension  5/5 bilat Knee flexion (seated)  5/5 bilat DF 5/5 bilat PF  WFL bilat (seated)  Supine bridge 2x10 with TrA  Supine alt UE/LE with TrA 2x10 Standing heel raises with TrA 2x10 Step ups with TrA on 2 inch and 4 inch step x 5 reps bilat each Standing rows with RTB with TrA 2x10 Standing shoulder extension with RTB with TrA 2x10 Standing biceps curls with 2# 2x10  Modified  Oswestry:  Initial/Current:  36% / 20%    6/6 Pt ambulated 3 laps with SPC  Supine bridge with TrA 2x10 Supine alt UE/LE with TrA 2x10 Seated Biceps curls with 2# 3x10 Standing rows with RTB 2x10 with TrA Standing shoulder extension with YTB 2x10 with TrA Step ups on 2 inch step with TrA x 10 bilat Standing heel raises with TrA 2x10  6/3 Pt ambulated 3 laps with SPC  Supine bridge with TrA 2x10 Supine alt UE/LE with TrA 2x10 Seated hip abd with RTB 2x10 Seated Biceps curls with 2# 3x10 Standing rows with RTB 2x10  Manual Therapy:  Gentle STM and very gentle rolling with the roller to bilat proximal ant thighs and quads   5/30 Manual:  Roller to anterior hip Trigger point release to anterior hip  Review of self trigger point release  There-ex:  Quad set 3x10  LTR 3x10   Neuro re-ed:  Punches 2lbs 3x10  Bicep curl with posture 3x10 2lbs       5/28 Supine march with TrA 2x10 Supine SLR with TrA 2x10 Supine bridge with TrA 2x10 Attempted supine manual HS stretch Sit to stand with TrA  3x5 Seated hip abd with RTB 2x10 Standing rows with TrA 2x10 with RTB Standing shoulder extension with TrA 2x10 with RTB  Pt ambulated 3 laps with [facility]  5/23 There-ex:  Ball roll fwd and lateral 2x10 each  Nu-step 5 min reviewed set up L 3   Neuro-re-ed Ball press 2x10  Hip abduction with band 2x10  Row with posture 2x10 red  Shoulder extension with posture 2x10   There-act:  Step up 2 inch 2x10   Lateral step up 2x10  Sit to stand transfer 3x5 from lowest table position     5/20 PT instructed pt in correct form and palpation of TrA contraction.  PT performed TrA contractions with cuing and instruction. Supine march with TrA x10, 1x5 Supine SLR with TrA 2x5 Supine manual HS stretch 2x30 sec bilat Seated hip abd withYTB 3x10 LAQ 2x10 Rows with TrA with YTB 2x10  Pt ambulated 2 laps with mostly with [facility]     PATIENT EDUCATION:  Education details:  HEP,  exercise form, rationale of interventions, relevant anatomy, and POC.  PT answered pt's questions. Person educated: Patient Education method:  Explanation, Demonstration, Tactile cues, Verbal cues Education comprehension: verbalized understanding, returned demonstration, verbal cues required, tactile cues required, and needs further education  HOME EXERCISE PROGRAM: Access Code: [email or web address] URL: [email or web address] Date: 02/13/2024 Prepared by: Marnie Siren  Exercises - Log Roll  - Supine Gluteal Sets  - 2 x daily - 7 x weekly - 2 sets - 10 reps - 5 secons hold - Supine Transversus Abdominis Bracing - Hands on Stomach  - 2 x daily - 7 x weekly - 2  sets - 10 reps - Supine Single Leg Ankle Pumps  - 2-3 x daily - 7 x weekly - 2 sets - 10 reps  ASSESSMENT:  CLINICAL IMPRESSION: The patient continues to progress well. Her pain was more centralized to her left paraspinal today. She was shown where to target with self mobilization. We continue to focus on core stability and posture with there-e. 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 Therapy will continue to progress as tolerated.      OBJECTIVE IMPAIRMENTS: decreased activity tolerance, decreased endurance, decreased mobility, difficulty walking, decreased strength, and pain.   ACTIVITY LIMITATIONS: carrying, lifting, bending, standing, squatting, stairs, transfers, bed mobility, and locomotion level  PARTICIPATION LIMITATIONS: meal prep, cleaning, laundry, shopping, and community activity  PERSONAL FACTORS: Time since onset of injury/illness/exacerbation and 1 comorbidity: L hip Gluteus medius repair and continued L hi pain are also affecting patient's functional outcome.   REHAB POTENTIAL: Good  CLINICAL DECISION MAKING: Stable/uncomplicated  EVALUATION COMPLEXITY: Low   GOALS:  SHORT TERM GOALS: Target date:  03/12/2024    Pt will be independent and compliant with HEP for improved pain, strength, function,  and mobility.   Baseline: Goal status: GOAL MET  04/10/24  2.  Pt will report at least a 25% improvement in ambulation.  Baseline:  Goal status: GOAL MET 04/10/24  3.  Pt will progress with walking program without adverse effects. Baseline:  Goal status: GOAL MET  04/10/24  4.  Pt will progress with exercises per protocol without adverse effects for improved core and LE strength in order to for improved tolerance and ease with daily activities and functional mobility.  Baseline:  Goal status: GOAL MET  6/13 Target date:  04/09/2024   LONG TERM GOALS: Target date: 05/07/2024  Pt will report she is able to ambulate extended community distance without increased back pain.  Baseline:  Goal status: PROGRESSING  2.  Pt will report she is able to perform household chores with expected limitations without significant pain and difficulty.  Baseline:  Goal status: ONGOING  3.  Pt will report good standing tolerance with her ADL's, IADL's, and community mobility.  Baseline:  Goal status: OBGOING  4.  Pt will demonstrate at least a 12% improvement in modified ODI for clinically significant improvement in self perceived disability.  Baseline:  Goal status: GOAL MET  6/13  5.  Pt will be independent with advanced HEP in order to have a consistent exercise routine for improved core and LE strength for good tolerance and performance of ADLs/IADLs and functional mobility.   Baseline:  Goal status: ONGOING    PLAN:  PT FREQUENCY: 2x/wk  PT DURATION: 4 weeks  PLANNED INTERVENTIONS: 97164- PT Re-evaluation, 97750- Physical Performance Testing, 97110-Therapeutic exercises, 97530- Therapeutic activity, [id number]- Neuromuscular re-education, 97535- Self Care, 30865- Manual therapy, [phone]- Gait training, [phone]- Aquatic Therapy, [phone]- Electrical stimulation (unattended), Patient/Family education, Balance training, Stair training, Taping, Dry Needling, Joint mobilization, Scar mobilization, DME  instructions, Cryotherapy, and Moist heat.  PLAN FOR NEXT SESSION: Cont per lumbar fusion protocol.     Signa Drier PT DPT 04/14/24 11:53 AM

## 2024-04-17 ENCOUNTER — Encounter (HOSPITAL_BASED_OUTPATIENT_CLINIC_OR_DEPARTMENT_OTHER): Payer: Self-pay | Admitting: Physical Therapy

## 2024-04-17 ENCOUNTER — Ambulatory Visit (HOSPITAL_BASED_OUTPATIENT_CLINIC_OR_DEPARTMENT_OTHER): Admitting: Physical Therapy

## 2024-04-17 DIAGNOSIS — R262 Difficulty in walking, not elsewhere classified: Secondary | ICD-10-CM | POA: Diagnosis not present

## 2024-04-17 DIAGNOSIS — M5459 Other low back pain: Secondary | ICD-10-CM | POA: Diagnosis not present

## 2024-04-17 DIAGNOSIS — M6281 Muscle weakness (generalized): Secondary | ICD-10-CM | POA: Diagnosis not present

## 2024-04-17 DIAGNOSIS — M25552 Pain in left hip: Secondary | ICD-10-CM | POA: Diagnosis not present

## 2024-04-17 NOTE — Therapy (Signed)
 OUTPATIENT PHYSICAL THERAPY THORACOLUMBAR TREATMENT        Patient Name: Margaret Graham MRN: [id number] DOB:[birth year], 78 y.o., female Today's Date: 04/17/2024  END OF SESSION:  PT End of Session - 04/17/24 1116     Visit Number 12    Number of Visits 20    Date for PT Re-Evaluation 05/07/24    Authorization Type MCR A and B    PT Start Time 1108    PT Stop Time 1158    PT Time Calculation (min) 50 min    Activity Tolerance Patient tolerated treatment well    Behavior During Therapy WFL for tasks assessed/performed                Past Medical History:  Diagnosis Date   Arthritis    GERD (gastroesophageal reflux disease)    Heart murmur    Hyperlipidemia    Hypothyroidism    Internal hemorrhoid    Osteopenia    Schatzki's ring    Sleep apnea    does not use a cpap machine   Thyroid  disease    Thyroid  nodule    right 1.7 cm   Tubular adenoma    Past Surgical History:  Procedure Laterality Date   ABDOMINAL HYSTERECTOMY     BREAST SURGERY     for dilated duct   CATARACT EXTRACTION Bilateral    GLUTEUS MINIMUS REPAIR Left 02/12/2023   Procedure: LEFT GLUTEUS MEDIUS REPAIR WITH POSSIBLE COLLAGEN PATCH PLACEMENT;  Surgeon: Wilhelmenia Harada, MD;  Location: [facility];  Service: Orthopedics;  Laterality: Left;   left thyroid  lobectomy  2004   MYRINGOTOMY WITH TUBE PLACEMENT Left 12/19/2022   Procedure: MYRINGOTOMY WITH TUBE PLACEMENT;  Surgeon: Daleen Dubs, DO;  Location: [facility];  Service: ENT;  Laterality: Left;   Patient Active Problem List   Diagnosis Date Noted   S/P lumbar fusion 01/17/2024   Tear of left gluteus medius tendon 02/12/2023   Strain of gluteus medius 02/06/2023   Chronic serous otitis media 12/19/2022   Osteopenia 01/23/2016   Dyslipidemia 01/18/2016   History of esophageal stricture 01/18/2016   Postoperative hypothyroidism 01/18/2016   Esophageal reflux 01/18/2016   Snoring 01/18/2016     REFERRING  PROVIDER: Jeannette Mills, NP  Surgeon:  Joaquin Mulberry, MD  REFERRING DIAG: M43.16 (ICD-10-CM) - Spondylolisthesis, lumbar region  Rationale for Evaluation and Treatment: Rehabilitation  THERAPY DIAG:  Other low back pain  Muscle weakness (generalized)  ONSET DATE: DOS 01/17/2024  SUBJECTIVE:  SUBJECTIVE STATEMENT: Pt is 13 weeks post op.  Pt denies any adverse effects after prior Rx.  Pt states her back has been sore this week.    Pt's walking has improved and she can walk longer distance.  Pt reports she has pain 1st thing in AM.  Pt has pain at night if she has done a lot during the day.  Pt is walking in her home without her cane.  Pt occasionally walks to the car without the cane unintentionally.     PERTINENT HISTORY:  -decompressive lumbar laminectomy, hemi facetectomy and foraminotomies L3-4 L4-5, Posterior lumbar interbody fusion L3-4 L4-5, Posterior fixation L3-L5, and Intertransverse arthrodesis L3-L5 on 01/17/24.  B / L / T restrictions though pt states MD stated it was ok for her to lean over and pick something up.   -L hip Gluteus medius repair and trochanteric bursectomy on 02/12/2023 which she may need to have it redone.  Pt still has L hip pain. -arthritis  PAIN:  NPRS:  5/10 pain, 5/10 worst Worst pain is 1st thing in AM or late at night. Location:  L sided lumbar  Easing factors:  sitting, lying  PRECAUTIONS: Back   WEIGHT BEARING RESTRICTIONS: lifting restrictions  FALLS:  Has patient fallen in last 6 months?   LIVING ENVIRONMENT: Lives with: lives with their spouse   OCCUPATION: Pt is retired  PLOF: Independent.   Pt has been using a SPC since hip surgery April 2024.  PATIENT GOALS: to be able to walk as long as I want to  NEXT MD VISIT:   OBJECTIVE:   Note: Objective measures were completed at Evaluation unless otherwise noted.  DIAGNOSTIC FINDINGS:  Pt is post op.  She had a MRI prior to surgery.  PATIENT SURVEYS:  Modified Oswestry 36%   COGNITION: Overall cognitive status: Within functional limits for tasks assessed     SENSATION: L2-S2 WFL to LT bilat   LUMBAR ROM:   Not tested due to contraindicated for surgical protocol and post op healing   LOWER EXTREMITY MMT:   Strength not tested due to healing constraints and post op restrictions   GAIT: Level of assistance: Independent Comments:  Pt ambulates with a heel to toe gait with SPC on R.  Pt doesn't perform arm swing on L  TREATMENT:                                                                                                                               6/20 Supine bridge with TrA 2x10 Supine alt UE/LE 2x10 Mini squats with TrA  with bilat UE support on bar approx 6-7 reps Sit to stands from table x 10, 5 reps Standing rows with TrA with GTB 2x10 Standing shoulder extension with TrA with RTB 2x10 Step ups on 4 inch step with TrA 2x5  Pt ambulated 3 laps with SPC.  Manual Therapy:  STM to L sided lumbar paraspinals in R S/L'ing  with pillow b/w knees.  6/17 Manual:  Trigger point release to lumbar paraspinals and gluteal  Neuro-re-ed:  Supine bridge 2x10 with TrA  Supine alt UE/LE with TrA 2x10 Shoulder extension 2x10 red  Bilateral bicpes curls with cuing for posture 2x10 2# Seated punch 2lbs 2x10   There-act:  2 inch 2x10     6/13  Reviewed current function, pain levels, and HEP compliance.  LE STRENGTH (MMT): Hip flexion 5/5 bilat Knee extension  5/5 bilat Knee flexion (seated)  5/5 bilat DF 5/5 bilat PF  WFL bilat (seated)  Supine bridge 2x10 with TrA  Supine alt UE/LE with TrA 2x10 Standing heel raises with TrA 2x10 Step ups with TrA on 2 inch and 4 inch step x 5 reps bilat each Standing rows with RTB with TrA 2x10 Standing  shoulder extension with RTB with TrA 2x10 Standing biceps curls with 2# 2x10  Modified Oswestry:  Initial/Current:  36% / 20%    6/6 Pt ambulated 3 laps with SPC  Supine bridge with TrA 2x10 Supine alt UE/LE with TrA 2x10 Seated Biceps curls with 2# 3x10 Standing rows with RTB 2x10 with TrA Standing shoulder extension with YTB 2x10 with TrA Step ups on 2 inch step with TrA x 10 bilat Standing heel raises with TrA 2x10  6/3 Pt ambulated 3 laps with SPC  Supine bridge with TrA 2x10 Supine alt UE/LE with TrA 2x10 Seated hip abd with RTB 2x10 Seated Biceps curls with 2# 3x10 Standing rows with RTB 2x10  Manual Therapy:  Gentle STM and very gentle rolling with the roller to bilat proximal ant thighs and quads   5/30 Manual:  Roller to anterior hip Trigger point release to anterior hip  Review of self trigger point release  There-ex:  Quad set 3x10  LTR 3x10   Neuro re-ed:  Punches 2lbs 3x10  Bicep curl with posture 3x10 2lbs       5/28 Supine march with TrA 2x10 Supine SLR with TrA 2x10 Supine bridge with TrA 2x10 Attempted supine manual HS stretch Sit to stand with TrA  3x5 Seated hip abd with RTB 2x10 Standing rows with TrA 2x10 with RTB Standing shoulder extension with TrA 2x10 with RTB  Pt ambulated 3 laps with [facility]  5/23 There-ex:  Ball roll fwd and lateral 2x10 each  Nu-step 5 min reviewed set up L 3   Neuro-re-ed Ball press 2x10  Hip abduction with band 2x10  Row with posture 2x10 red  Shoulder extension with posture 2x10   There-act:  Step up 2 inch 2x10   Lateral step up 2x10  Sit to stand transfer 3x5 from lowest table position      PATIENT EDUCATION:  Education details:  HEP, exercise form, rationale of interventions, relevant anatomy, and POC.  PT answered pt's questions. Person educated: Patient Education method:  Explanation, Demonstration, Tactile cues, Verbal cues Education comprehension: verbalized understanding, returned  demonstration, verbal cues required, tactile cues required, and needs further education  HOME EXERCISE PROGRAM: Access Code: [email or web address] URL: [email or web address] Date: 02/13/2024 Prepared by: Marnie Siren  Exercises - Log Roll  - Supine Gluteal Sets  - 2 x daily - 7 x weekly - 2 sets - 10 reps - 5 secons hold - Supine Transversus Abdominis Bracing - Hands on Stomach  - 2 x daily - 7 x weekly - 2 sets - 10 reps - Supine Single Leg Ankle Pumps  - 2-3 x daily - 7 x weekly - 2 sets - 10  reps  ASSESSMENT:  CLINICAL IMPRESSION: Pt presents to Rx stating her back has been sore this week and reports increased pain today.  The patient continues to progress well.  PT had pt perform mini squats with UE support though pt had difficulty with correct form.  Pt also reported some back pain with mini squats.  PT had pt perform sit to stands instead and she had better tolerance.  Pt performed exercises per protocol to improve core strength, functional strength, pain, and functional mobility.  Pt had tenderness in L sided lumbar paraspinals and PT performed STM to L sided lumbar paraspinals.  She responded well to Rx reporting improved pain to 4/10 after Rx.  Pt should continue to benefit from cont skilled PT to address ongoing goals and impairments and to restore desired level of function.      OBJECTIVE IMPAIRMENTS: decreased activity tolerance, decreased endurance, decreased mobility, difficulty walking, decreased strength, and pain.   ACTIVITY LIMITATIONS: carrying, lifting, bending, standing, squatting, stairs, transfers, bed mobility, and locomotion level  PARTICIPATION LIMITATIONS: meal prep, cleaning, laundry, shopping, and community activity  PERSONAL FACTORS: Time since onset of injury/illness/exacerbation and 1 comorbidity: L hip Gluteus medius repair and continued L hi pain are also affecting patient's functional outcome.   REHAB POTENTIAL: Good  CLINICAL DECISION MAKING:  Stable/uncomplicated  EVALUATION COMPLEXITY: Low   GOALS:  SHORT TERM GOALS: Target date:  03/12/2024    Pt will be independent and compliant with HEP for improved pain, strength, function, and mobility.   Baseline: Goal status: GOAL MET  04/10/24  2.  Pt will report at least a 25% improvement in ambulation.  Baseline:  Goal status: GOAL MET 04/10/24  3.  Pt will progress with walking program without adverse effects. Baseline:  Goal status: GOAL MET  04/10/24  4.  Pt will progress with exercises per protocol without adverse effects for improved core and LE strength in order to for improved tolerance and ease with daily activities and functional mobility.  Baseline:  Goal status: GOAL MET  6/13 Target date:  04/09/2024   LONG TERM GOALS: Target date: 05/07/2024  Pt will report she is able to ambulate extended community distance without increased back pain.  Baseline:  Goal status: PROGRESSING  2.  Pt will report she is able to perform household chores with expected limitations without significant pain and difficulty.  Baseline:  Goal status: ONGOING  3.  Pt will report good standing tolerance with her ADL's, IADL's, and community mobility.  Baseline:  Goal status: OBGOING  4.  Pt will demonstrate at least a 12% improvement in modified ODI for clinically significant improvement in self perceived disability.  Baseline:  Goal status: GOAL MET  6/13  5.  Pt will be independent with advanced HEP in order to have a consistent exercise routine for improved core and LE strength for good tolerance and performance of ADLs/IADLs and functional mobility.   Baseline:  Goal status: ONGOING    PLAN:  PT FREQUENCY: 2x/wk  PT DURATION: 4 weeks  PLANNED INTERVENTIONS: 97164- PT Re-evaluation, 97750- Physical Performance Testing, 97110-Therapeutic exercises, 97530- Therapeutic activity, [id number]- Neuromuscular re-education, 97535- Self Care, 16109- Manual therapy, [phone]- Gait training,  [phone]- Aquatic Therapy, [phone]- Electrical stimulation (unattended), Patient/Family education, Balance training, Stair training, Taping, Dry Needling, Joint mobilization, Scar mobilization, DME instructions, Cryotherapy, and Moist heat.  PLAN FOR NEXT SESSION: Cont per lumbar fusion protocol.     Trina Fujita III PT, DPT 04/17/24 3:39 PM

## 2024-04-21 ENCOUNTER — Ambulatory Visit (INDEPENDENT_AMBULATORY_CARE_PROVIDER_SITE_OTHER): Admitting: Gastroenterology

## 2024-04-21 ENCOUNTER — Encounter: Payer: Self-pay | Admitting: Gastroenterology

## 2024-04-21 ENCOUNTER — Ambulatory Visit (HOSPITAL_BASED_OUTPATIENT_CLINIC_OR_DEPARTMENT_OTHER): Admitting: Physical Therapy

## 2024-04-21 ENCOUNTER — Encounter (HOSPITAL_BASED_OUTPATIENT_CLINIC_OR_DEPARTMENT_OTHER): Payer: Self-pay | Admitting: Physical Therapy

## 2024-04-21 VITALS — BP 100/60 | HR 105 | Ht 65.0 in | Wt 149.2 lb

## 2024-04-21 DIAGNOSIS — Z860100 Personal history of colon polyps, unspecified: Secondary | ICD-10-CM

## 2024-04-21 DIAGNOSIS — M6281 Muscle weakness (generalized): Secondary | ICD-10-CM

## 2024-04-21 DIAGNOSIS — R262 Difficulty in walking, not elsewhere classified: Secondary | ICD-10-CM | POA: Diagnosis not present

## 2024-04-21 DIAGNOSIS — R1032 Left lower quadrant pain: Secondary | ICD-10-CM

## 2024-04-21 DIAGNOSIS — M5459 Other low back pain: Secondary | ICD-10-CM | POA: Diagnosis not present

## 2024-04-21 DIAGNOSIS — K5730 Diverticulosis of large intestine without perforation or abscess without bleeding: Secondary | ICD-10-CM | POA: Diagnosis not present

## 2024-04-21 DIAGNOSIS — K5909 Other constipation: Secondary | ICD-10-CM

## 2024-04-21 DIAGNOSIS — Z860101 Personal history of adenomatous and serrated colon polyps: Secondary | ICD-10-CM

## 2024-04-21 DIAGNOSIS — M25552 Pain in left hip: Secondary | ICD-10-CM | POA: Diagnosis not present

## 2024-04-21 MED ORDER — NA SULFATE-K SULFATE-MG SULF 17.5-3.13-1.6 GM/177ML PO SOLN: 1.0000 | Freq: Once | ORAL | 0 refills | Status: AC

## 2024-04-21 NOTE — Progress Notes (Signed)
 Margaret Graham  Chief Complaint: LLQ pain  Summary of GI history:  From APP office visit April 2025:  Margaret Graham is a 78 year old female with a past medical history as listed below including reflux and multiple others, known to Dr. Legrand, who presents to clinic today for a follow-up of her reflux and medication refill.      04/15/2015 EGD in [place] with diverticulum in the second part of the duodenum, esophageal hiatal hernia, ring in the gastroesophageal junction dilated and grade a esophagitis.  Stayed on Pepcid  20 mg daily at that time.    12/03/2017 colonoscopy with diverticulosis in the left colon and one 2 mm polyp in the rectum, exam otherwise normal.  Constipation appear to be combination of diverticulosis and pelvic floor laxity seen on CT scan.  Pathology showed tubular adenoma.  Deliyah was doing well at that point on famotidine  40 mg daily, prescription for which was refilled.  Subjective  HPI:  [name] was here with her husband today with concerns about left lower quadrant pain.  While she still has chronic constipation that waxes and wanes and needs periodic MiraLAX for maintenance, she is concerned about some dull fairly constant left lower quadrant pain that has been going on about a month now.  She initially thought it might be related to constipation, but it has not seemed to be affected by that. No rectal bleeding nausea vomiting loss of appetite or weight loss.  No vaginal bleeding. She is particularly concerned about this because her sister was recently diagnosed with metastatic ovarian cancer.  [name] had a previous hysterectomy but is confident she still has her ovaries. She also reports having had a lumbar fusion in March of this year but is uncertain that is related to the current symptoms.  ROS: Cardiovascular:  no chest pain Respiratory: no dyspnea  The patient's Past Medical, Family and Social History were reviewed and are on file in the  [facility]. Past Medical History:  Diagnosis Date   Arthritis    GERD (gastroesophageal reflux disease)    Heart murmur    Hyperlipidemia    Hypothyroidism    Internal hemorrhoid    Osteopenia    Schatzki's ring    Sleep apnea    does not use a cpap machine   Thyroid  disease    Thyroid  nodule    right 1.7 cm   Tubular adenoma     Past Surgical History:  Procedure Laterality Date   ABDOMINAL HYSTERECTOMY     BREAST SURGERY     for dilated duct   CATARACT EXTRACTION Bilateral    GLUTEUS MINIMUS REPAIR Left 02/12/2023   Procedure: LEFT GLUTEUS MEDIUS REPAIR WITH POSSIBLE COLLAGEN PATCH PLACEMENT;  Surgeon: Genelle Standing, MD;  Location: [facility];  Service: Orthopedics;  Laterality: Left;   left thyroid  lobectomy  2004   Lumbar fusion  2025   MYRINGOTOMY WITH TUBE PLACEMENT Left 12/19/2022   Procedure: MYRINGOTOMY WITH TUBE PLACEMENT;  Surgeon: Llewellyn Gerard LABOR, DO;  Location: [facility];  Service: ENT;  Laterality: Left;    Objective:  Med list reviewed  Current Outpatient Medications:    alendronate (FOSAMAX) 70 MG tablet, Take 70 mg by mouth every Wednesday. Take with a full glass of water on an empty stomach., Disp: , Rfl:    Ascorbic Acid  (VITAMIN C ) 1000 MG tablet, Take 2,000 mg by mouth daily., Disp: , Rfl:    ASPIRIN  81 PO, Take 1 tablet by mouth daily.,  Disp: , Rfl:    Calcium Carb-Cholecalciferol  (CALCIUM 600 + D PO), Take 1 tablet by mouth 2 (two) times daily., Disp: , Rfl:    Carboxymethylcellulose Sodium (REFRESH PLUS OP), Place 1 drop into both eyes 2 (two) times daily. PF, Disp: , Rfl:    ezetimibe  (ZETIA ) 10 MG tablet, Take 10 mg by mouth daily., Disp: , Rfl:    famotidine  (PEPCID ) 40 MG tablet, Take 1 tablet (40 mg total) by mouth at bedtime., Disp: 90 tablet, Rfl: 3   levothyroxine  (SYNTHROID , LEVOTHROID) 100 MCG tablet, Take 1 tablet (100 mcg total) by mouth every morning., Disp: 30 tablet, Rfl: 3   Multiple Vitamin (MULTIVITAMIN) capsule, Take 1  capsule by mouth daily., Disp: , Rfl:    Multiple Vitamins-Minerals (PRESERVISION AREDS 2) CAPS, Take 1 tablet by mouth 2 (two) times daily., Disp: , Rfl:    Phenyleph-Doxylamine-DM-APAP (NYQUIL SEVERE COLD/FLU) 5-6.25-10-325 MG/15ML LIQD, Take 30 mLs by mouth at bedtime., Disp: , Rfl:    simvastatin  (ZOCOR ) 20 MG tablet, Take 1 tablet (20 mg total) by mouth daily., Disp: 30 tablet, Rfl: 0  Current Facility-Administered Medications:    0.9 %  sodium chloride  infusion, 500 mL, Intravenous, Once, Danis, Victory CROME III, MD   Vital signs in last 24 hrs: Vitals:   04/21/24 0923  BP: 100/60  Pulse: (!) 105   Wt Readings from Last 3 Encounters:  04/21/24 149 lb 3.2 oz (67.7 kg)  02/25/24 155 lb (70.3 kg)  01/17/24 155 lb (70.3 kg)    (Graham weight is down 6 pounds on our scale from her last visit here about 6 weeks ago) Physical Exam  Her husband is present for the visit, she is well-appearing Antalgic gait, gets on exam table without assistance HEENT: sclera anicteric, oral mucosa moist without lesions Neck: supple, no thyromegaly, JVD or lymphadenopathy Cardiac: Regular without appreciable murmur,  no peripheral edema Pulm: clear to auscultation bilaterally, normal RR and effort noted Abdomen: soft, no focal tenderness, with active bowel sounds. No guarding or palpable hepatosplenomegaly. Skin; warm and dry, no jaundice or rash  Labs:  No recent data from PCP office or otherwise available at today's visit ___________________________________________ Radiologic studies:   ____________________________________________ Other:   _____________________________________________ Assessment & Plan  Assessment: Encounter Diagnoses  Name Primary?   LLQ abdominal pain Yes   Diverticulosis of colon without hemorrhage    Chronic constipation    About a month of fairly constant dull left lower quadrant abdominal pain of unclear cause.  She is understandably concerned given her sisters  history noted above.  Constipation likely related to diverticulosis (also reports thin stools) and known pelvic floor laxity seen on prior imaging.  History of colon polyps (tubular adenoma January 2019)-due for surveillance exam.  The benefits and risks of the planned procedure(s) were described in detail with the patient or (when appropriate) their health care proxy.  Risks were outlined as including, but not limited to, bleeding, infection, perforation, adverse medication reaction leading to cardiac or pulmonary decompensation, pancreatitis (if ERCP).  The limitation of incomplete mucosal visualization was also discussed.  No guarantees or warranties were given.   Plan: Colonoscopy for polyp surveillance schedule.  I think this will also bring her reassurance to rule out any more serious problems in the left colon.  We have also scheduled an ultrasound of the entire abdomen and pelvis including transvaginal view.   Victory CROME Brand III

## 2024-04-21 NOTE — Therapy (Signed)
 OUTPATIENT PHYSICAL THERAPY THORACOLUMBAR TREATMENT        Patient Name: Margaret Graham MRN: [id number] DOB:03/06/1946, 78 y.o., female Today's Date: 04/21/2024  END OF SESSION:  PT End of Session - 04/21/24 1152     Visit Number 13    Number of Visits 20    Date for PT Re-Evaluation 05/07/24    Authorization Type MCR A and B    PT Start Time 1143    PT Stop Time 1225    PT Time Calculation (min) 42 min    Activity Tolerance Patient tolerated treatment well    Behavior During Therapy WFL for tasks assessed/performed                 Past Medical History:  Diagnosis Date   Arthritis    GERD (gastroesophageal reflux disease)    Heart murmur    Hyperlipidemia    Hypothyroidism    Internal hemorrhoid    Osteopenia    Schatzki's ring    Sleep apnea    does not use a cpap machine   Thyroid  disease    Thyroid  nodule    right 1.7 cm   Tubular adenoma    Past Surgical History:  Procedure Laterality Date   ABDOMINAL HYSTERECTOMY     BREAST SURGERY     for dilated duct   CATARACT EXTRACTION Bilateral    GLUTEUS MINIMUS REPAIR Left 02/12/2023   Procedure: LEFT GLUTEUS MEDIUS REPAIR WITH POSSIBLE COLLAGEN PATCH PLACEMENT;  Surgeon: Genelle Standing, MD;  Location: [facility];  Service: Orthopedics;  Laterality: Left;   left thyroid  lobectomy  2004   Lumbar fusion  2025   MYRINGOTOMY WITH TUBE PLACEMENT Left 12/19/2022   Procedure: MYRINGOTOMY WITH TUBE PLACEMENT;  Surgeon: Llewellyn Gerard LABOR, DO;  Location: [facility];  Service: ENT;  Laterality: Left;   Patient Active Problem List   Diagnosis Date Noted   S/P lumbar fusion 01/17/2024   Tear of left gluteus medius tendon 02/12/2023   Strain of gluteus medius 02/06/2023   Chronic serous otitis media 12/19/2022   Osteopenia 01/23/2016   Dyslipidemia 01/18/2016   History of esophageal stricture 01/18/2016   Postoperative hypothyroidism 01/18/2016   Esophageal reflux 01/18/2016   Snoring  01/18/2016     REFERRING PROVIDER: Orlean Suzen Lacks, NP  Surgeon:  Joshua Alm Hamilton, MD  REFERRING DIAG: M43.16 (ICD-10-CM) - Spondylolisthesis, lumbar region  Rationale for Evaluation and Treatment: Rehabilitation  THERAPY DIAG:  No diagnosis found.  ONSET DATE: DOS 01/17/2024  SUBJECTIVE:  SUBJECTIVE STATEMENT: Pt is 13 weeks post op.  Pt denies any adverse effects after prior Rx.  Pt states her back has been sore this week.    Pt's walking has improved and she can walk longer distance.  Pt reports she has pain 1st thing in AM.  Pt has pain at night if she has done a lot during the day.  Pt is walking in her home without her cane.  Pt occasionally walks to the car without the cane unintentionally.     PERTINENT HISTORY:  -decompressive lumbar laminectomy, hemi facetectomy and foraminotomies L3-4 L4-5, Posterior lumbar interbody fusion L3-4 L4-5, Posterior fixation L3-L5, and Intertransverse arthrodesis L3-L5 on 01/17/24.  B / L / T restrictions though pt states MD stated it was ok for her to lean over and pick something up.   -L hip Gluteus medius repair and trochanteric bursectomy on 02/12/2023 which she may need to have it redone.  Pt still has L hip pain. -arthritis  PAIN:  NPRS:  5/10 pain, 5/10 worst Worst pain is 1st thing in AM or late at night. Location:  L sided lumbar  Easing factors:  sitting, lying  PRECAUTIONS: Back   WEIGHT BEARING RESTRICTIONS: lifting restrictions  FALLS:  Has patient fallen in last 6 months?   LIVING ENVIRONMENT: Lives with: lives with their spouse   OCCUPATION: Pt is retired  PLOF: Independent.   Pt has been using a SPC since hip surgery April 2024.  PATIENT GOALS: to be able to walk as long as I want to  NEXT MD VISIT:   OBJECTIVE:   Note: Objective measures were completed at Evaluation unless otherwise noted.  DIAGNOSTIC FINDINGS:  Pt is post op.  She had a MRI prior to surgery.  PATIENT SURVEYS:  Modified Oswestry 36%   COGNITION: Overall cognitive status: Within functional limits for tasks assessed     SENSATION: L2-S2 WFL to LT bilat   LUMBAR ROM:   Not tested due to contraindicated for surgical protocol and post op healing   LOWER EXTREMITY MMT:   Strength not tested due to healing constraints and post op restrictions   GAIT: Level of assistance: Independent Comments:  Pt ambulates with a heel to toe gait with SPC on R.  Pt doesn't perform arm swing on L  TREATMENT:                                                                                                                               6/24 There-ex:  LAQ red x10 RPE of 3  LAQ green 2x10 RPE of 7   Hip abdcution green 3x10    Unable to do seated march with resistance   There-act:  Step up 2x10 each leg 4 inch  Mini squat 3x10 with min cuing   Gait: 300' no significant Trendelenburg noted with ambulation. No pain noted with walking. Gait observed but no cuing required for correction.  6/20 Supine bridge with TrA 2x10 Supine alt UE/LE 2x10 Mini squats with TrA  with bilat UE support on bar approx 6-7 reps Sit to stands from table x 10, 5 reps Standing rows with TrA with GTB 2x10 Standing shoulder extension with TrA with RTB 2x10 Step ups on 4 inch step with TrA 2x5  Pt ambulated 3 laps with SPC.  Manual Therapy:  STM to L sided lumbar paraspinals in R S/L'ing with pillow b/w knees.  6/17 Manual:  Trigger point release to lumbar paraspinals and gluteal  Neuro-re-ed:  Supine bridge 2x10 with TrA  Supine alt UE/LE with TrA 2x10 Shoulder extension 2x10 red  Bilateral bicpes curls with cuing for posture 2x10 2# Seated punch 2lbs 2x10   There-act:  2 inch 2x10     6/13  Reviewed current function, pain levels, and  HEP compliance.  LE STRENGTH (MMT): Hip flexion 5/5 bilat Knee extension  5/5 bilat Knee flexion (seated)  5/5 bilat DF 5/5 bilat PF  WFL bilat (seated)  Supine bridge 2x10 with TrA  Supine alt UE/LE with TrA 2x10 Standing heel raises with TrA 2x10 Step ups with TrA on 2 inch and 4 inch step x 5 reps bilat each Standing rows with RTB with TrA 2x10 Standing shoulder extension with RTB with TrA 2x10 Standing biceps curls with 2# 2x10  Modified Oswestry:  Initial/Current:  36% / 20%    6/6 Pt ambulated 3 laps with SPC  Supine bridge with TrA 2x10 Supine alt UE/LE with TrA 2x10 Seated Biceps curls with 2# 3x10 Standing rows with RTB 2x10 with TrA Standing shoulder extension with YTB 2x10 with TrA Step ups on 2 inch step with TrA x 10 bilat Standing heel raises with TrA 2x10  6/3 Pt ambulated 3 laps with SPC  Supine bridge with TrA 2x10 Supine alt UE/LE with TrA 2x10 Seated hip abd with RTB 2x10 Seated Biceps curls with 2# 3x10 Standing rows with RTB 2x10  Manual Therapy:  Gentle STM and very gentle rolling with the roller to bilat proximal ant thighs and quads   5/30 Manual:  Roller to anterior hip Trigger point release to anterior hip  Review of self trigger point release  There-ex:  Quad set 3x10  LTR 3x10   Neuro re-ed:  Punches 2lbs 3x10  Bicep curl with posture 3x10 2lbs         PATIENT EDUCATION:  Education details:  HEP, exercise form, rationale of interventions, relevant anatomy, and POC.  PT answered pt's questions. Person educated: Patient Education method:  Explanation, Demonstration, Tactile cues, Verbal cues Education comprehension: verbalized understanding, returned demonstration, verbal cues required, tactile cues required, and needs further education  HOME EXERCISE PROGRAM: Access Code: [email or web address] URL: [email or web address] Date: 02/13/2024 Prepared by: Mose Minerva  Exercises - Log Roll  - Supine Gluteal Sets  - 2  x daily - 7 x weekly - 2 sets - 10 reps - 5 secons hold - Supine Transversus Abdominis Bracing - Hands on Stomach  - 2 x daily - 7 x weekly - 2 sets - 10 reps - Supine Single Leg Ankle Pumps  - 2-3 x daily - 7 x weekly - 2 sets - 10 reps  ASSESSMENT:  CLINICAL IMPRESSION: The patient had no pain entering today. We worked on advancing her exercises today. She tolerated well. We moved her LAQ's and hip abduction to a green band. We also worked on walking without the cane. She does well for the distance that she walked.  She has one more visit scheduled. We will discuss D/C to HEP next visit.  OBJECTIVE IMPAIRMENTS: decreased activity tolerance, decreased endurance, decreased mobility, difficulty walking, decreased strength, and pain.   ACTIVITY LIMITATIONS: carrying, lifting, bending, standing, squatting, stairs, transfers, bed mobility, and locomotion level  PARTICIPATION LIMITATIONS: meal prep, cleaning, laundry, shopping, and community activity  PERSONAL FACTORS: Time since onset of injury/illness/exacerbation and 1 comorbidity: L hip Gluteus medius repair and continued L hi pain are also affecting patient's functional outcome.   REHAB POTENTIAL: Good  CLINICAL DECISION MAKING: Stable/uncomplicated  EVALUATION COMPLEXITY: Low   GOALS:  SHORT TERM GOALS: Target date:  03/12/2024    Pt will be independent and compliant with HEP for improved pain, strength, function, and mobility.   Baseline: Goal status: GOAL MET  04/10/24  2.  Pt will report at least a 25% improvement in ambulation.  Baseline:  Goal status: GOAL MET 04/10/24  3.  Pt will progress with walking program without adverse effects. Baseline:  Goal status: GOAL MET  04/10/24  4.  Pt will progress with exercises per protocol without adverse effects for improved core and LE strength in order to for improved tolerance and ease with daily activities and functional mobility.  Baseline:  Goal status: GOAL MET  6/13 Target  date:  04/09/2024   LONG TERM GOALS: Target date: 05/07/2024  Pt will report she is able to ambulate extended community distance without increased back pain.  Baseline:  Goal status: PROGRESSING  2.  Pt will report she is able to perform household chores with expected limitations without significant pain and difficulty.  Baseline:  Goal status: ONGOING  3.  Pt will report good standing tolerance with her ADL's, IADL's, and community mobility.  Baseline:  Goal status: OBGOING  4.  Pt will demonstrate at least a 12% improvement in modified ODI for clinically significant improvement in self perceived disability.  Baseline:  Goal status: GOAL MET  6/13  5.  Pt will be independent with advanced HEP in order to have a consistent exercise routine for improved core and LE strength for good tolerance and performance of ADLs/IADLs and functional mobility.   Baseline:  Goal status: ONGOING    PLAN:  PT FREQUENCY: 2x/wk  PT DURATION: 4 weeks  PLANNED INTERVENTIONS: 97164- PT Re-evaluation, 97750- Physical Performance Testing, 97110-Therapeutic exercises, 97530- Therapeutic activity, [id number]- Neuromuscular re-education, 97535- Self Care, [id number]- Manual therapy, [phone]- Gait training, [phone]- Aquatic Therapy, [phone]- Electrical stimulation (unattended), Patient/Family education, Balance training, Stair training, Taping, Dry Needling, Joint mobilization, Scar mobilization, DME instructions, Cryotherapy, and Moist heat.  PLAN FOR NEXT SESSION: Cont per lumbar fusion protocol.     Alm Don PT DPT 04/21/24 1:40 PM

## 2024-04-21 NOTE — Patient Instructions (Signed)
 You have been scheduled for a colonoscopy. Please follow written instructions given to you at your visit today.   If you use inhalers (even only as needed), please bring them with you on the day of your procedure.  DO NOT TAKE 7 DAYS PRIOR TO TEST- Trulicity (dulaglutide) Ozempic, Wegovy (semaglutide) Mounjaro (tirzepatide) Bydureon Bcise (exanatide extended release)  DO NOT TAKE 1 DAY PRIOR TO YOUR TEST Rybelsus (semaglutide) Adlyxin (lixisenatide) Victoza (liraglutide) Byetta (exanatide) ___________________________________________________________________________  You have been scheduled for an abdominal and transvaginal ultrasound at [facility] (1st floor of hospital) on 04/24/24 at 3 pm. Please arrive 30 minutes prior to your appointment for registration. Make certain not to have anything to eat or drink after 9 am. Should you need to reschedule your appointment, please contact radiology at [phone]. This test typically takes about 30 minutes to perform.   _______________________________________________________  If your blood pressure at your visit was 140/90 or greater, please contact your primary care physician to follow up on this.  _______________________________________________________  If you are age 20 or older, your body mass index should be between 23-30. Your Body mass index is 24.83 kg/m. If this is out of the aforementioned range listed, please consider follow up with your Primary Care Provider.  If you are age 36 or younger, your body mass index should be between 19-25. Your Body mass index is 24.83 kg/m. If this is out of the aformentioned range listed, please consider follow up with your Primary Care Provider.   ________________________________________________________  The [facility] providers would like to encourage you to use MYCHART to communicate with providers for non-urgent requests or questions.  Due to long hold times on the telephone, sending  your provider a message by [facility] may be a faster and more efficient way to get a response.  Please allow 48 business hours for a response.  Please remember that this is for non-urgent requests.  _______________________________________________________  Thank you for trusting me with your gastrointestinal care!    Dr. Victory Legrand [facility]

## 2024-04-22 ENCOUNTER — Encounter (HOSPITAL_BASED_OUTPATIENT_CLINIC_OR_DEPARTMENT_OTHER): Payer: Self-pay | Admitting: Physical Therapy

## 2024-04-23 ENCOUNTER — Ambulatory Visit (HOSPITAL_BASED_OUTPATIENT_CLINIC_OR_DEPARTMENT_OTHER): Admitting: Physical Therapy

## 2024-04-23 DIAGNOSIS — M5459 Other low back pain: Secondary | ICD-10-CM

## 2024-04-23 DIAGNOSIS — M25552 Pain in left hip: Secondary | ICD-10-CM

## 2024-04-23 DIAGNOSIS — M6281 Muscle weakness (generalized): Secondary | ICD-10-CM

## 2024-04-23 DIAGNOSIS — R262 Difficulty in walking, not elsewhere classified: Secondary | ICD-10-CM | POA: Diagnosis not present

## 2024-04-23 NOTE — Therapy (Signed)
 OUTPATIENT PHYSICAL THERAPY THORACOLUMBAR TREATMENT        Patient Name: Margaret Graham MRN: [id number] DOB:1946/06/30, 78 y.o., female Today's Date: 04/24/2024  END OF SESSION:  PT End of Session - 04/24/24 0709     Visit Number 14    Number of Visits 20    Date for PT Re-Evaluation 05/07/24    Authorization Type MCR A and B    PT Start Time 1430    PT Stop Time 1512    PT Time Calculation (min) 42 min    Activity Tolerance Patient tolerated treatment well    Behavior During Therapy WFL for tasks assessed/performed                  Past Medical History:  Diagnosis Date   Arthritis    GERD (gastroesophageal reflux disease)    Heart murmur    Hyperlipidemia    Hypothyroidism    Internal hemorrhoid    Osteopenia    Schatzki's ring    Sleep apnea    does not use a cpap machine   Thyroid  disease    Thyroid  nodule    right 1.7 cm   Tubular adenoma    Past Surgical History:  Procedure Laterality Date   ABDOMINAL HYSTERECTOMY     BREAST SURGERY     for dilated duct   CATARACT EXTRACTION Bilateral    GLUTEUS MINIMUS REPAIR Left 02/12/2023   Procedure: LEFT GLUTEUS MEDIUS REPAIR WITH POSSIBLE COLLAGEN PATCH PLACEMENT;  Surgeon: Genelle Standing, MD;  Location: [facility];  Service: Orthopedics;  Laterality: Left;   left thyroid  lobectomy  2004   Lumbar fusion  2025   MYRINGOTOMY WITH TUBE PLACEMENT Left 12/19/2022   Procedure: MYRINGOTOMY WITH TUBE PLACEMENT;  Surgeon: Llewellyn Gerard LABOR, DO;  Location: [facility];  Service: ENT;  Laterality: Left;   Patient Active Problem List   Diagnosis Date Noted   S/P lumbar fusion 01/17/2024   Tear of left gluteus medius tendon 02/12/2023   Strain of gluteus medius 02/06/2023   Chronic serous otitis media 12/19/2022   Osteopenia 01/23/2016   Dyslipidemia 01/18/2016   History of esophageal stricture 01/18/2016   Postoperative hypothyroidism 01/18/2016   Esophageal reflux 01/18/2016   Snoring  01/18/2016     REFERRING PROVIDER: Orlean Suzen Lacks, NP  Surgeon:  Joshua Alm Hamilton, MD  REFERRING DIAG: M43.16 (ICD-10-CM) - Spondylolisthesis, lumbar region  Rationale for Evaluation and Treatment: Rehabilitation  THERAPY DIAG:  Other low back pain  Muscle weakness (generalized)  Pain in left hip  Difficulty walking  ONSET DATE: DOS 01/17/2024  SUBJECTIVE:  SUBJECTIVE STATEMENT: The patient is doing well.  She reports no pain today.  She has been working on her exercises.  She hopes to start working on her individual program consistently.  She continues to use a cane.  She is advised by the safest option at this time.  Pt's walking has improved and she can walk longer distance.  Pt reports she has pain 1st thing in AM.  Pt has pain at night if she has done a lot during the day.  Pt is walking in her home without her cane.  Pt occasionally walks to the car without the cane unintentionally.     PERTINENT HISTORY:  -decompressive lumbar laminectomy, hemi facetectomy and foraminotomies L3-4 L4-5, Posterior lumbar interbody fusion L3-4 L4-5, Posterior fixation L3-L5, and Intertransverse arthrodesis L3-L5 on 01/17/24.  B / L / T restrictions though pt states MD stated it was ok for her to lean over and pick something up.   -L hip Gluteus medius repair and trochanteric bursectomy on 02/12/2023 which she may need to have it redone.  Pt still has L hip pain. -arthritis  PAIN:  NPRS:  5/10 pain, 5/10 worst Worst pain is 1st thing in AM or late at night. Location:  L sided lumbar  Easing factors:  sitting, lying  PRECAUTIONS: Back   WEIGHT BEARING RESTRICTIONS: lifting restrictions  FALLS:  Has patient fallen in last 6 months?   LIVING ENVIRONMENT: Lives with: lives with their  spouse   OCCUPATION: Pt is retired  PLOF: Independent.   Pt has been using a SPC since hip surgery April 2024.  PATIENT GOALS: to be able to walk as long as I want to  NEXT MD VISIT:   OBJECTIVE:  Note: Objective measures were completed at Evaluation unless otherwise noted.  DIAGNOSTIC FINDINGS:  Pt is post op.  She had a MRI prior to surgery.  PATIENT SURVEYS:  Modified Oswestry 36%   COGNITION: Overall cognitive status: Within functional limits for tasks assessed     SENSATION: L2-S2 WFL to LT bilat   LUMBAR ROM:   Not tested due to contraindicated for surgical protocol and post op healing   LOWER EXTREMITY MMT:   Strength not tested due to healing constraints and post op restrictions   GAIT: Level of assistance: Independent Comments:  Pt ambulates with a heel to toe gait with SPC on R.  Pt doesn't perform arm swing on L  TREATMENT:                                                                                                                               6/26  TherEX: NuStep 5 minutes L4 Seated long arc quad red 3 x 10  Reviewed complete HEP and how to use at home Updated HEP  Neuromuscular reeducation Supine march 2 x 10 with education on progression Supine bridge for set no resistance x 12 Second 2 sets 2 x 12 red band with  isometric abduction Supine hip abduction 3 x 14   Shoulder extension with cueing for posture breathing 3x 12 red Row 3 X 12 red  Reviewed modified Oswestry 12% disability at this time 6/24 There-ex:  LAQ red x10 RPE of 3  LAQ green 2x10 RPE of 7   Hip abdcution green 3x10    Unable to do seated march with resistance   There-act:  Step up 2x10 each leg 4 inch  Mini squat 3x10 with min cuing   Gait: 300' no significant Trendelenburg noted with ambulation. No pain noted with walking. Gait observed but no cuing required for correction.     6/20 Supine bridge with TrA 2x10 Supine alt UE/LE 2x10 Mini squats with TrA   with bilat UE support on bar approx 6-7 reps Sit to stands from table x 10, 5 reps Standing rows with TrA with GTB 2x10 Standing shoulder extension with TrA with RTB 2x10 Step ups on 4 inch step with TrA 2x5  Pt ambulated 3 laps with SPC.  Manual Therapy:  STM to L sided lumbar paraspinals in R S/L'ing with pillow b/w knees.  6/17 Manual:  Trigger point release to lumbar paraspinals and gluteal  Neuro-re-ed:  Supine bridge 2x10 with TrA  Supine alt UE/LE with TrA 2x10 Shoulder extension 2x10 red  Bilateral bicpes curls with cuing for posture 2x10 2# Seated punch 2lbs 2x10   There-act:  2 inch 2x10     6/13  Reviewed current function, pain levels, and HEP compliance.  LE STRENGTH (MMT): Hip flexion 5/5 bilat Knee extension  5/5 bilat Knee flexion (seated)  5/5 bilat DF 5/5 bilat PF  WFL bilat (seated)  Supine bridge 2x10 with TrA  Supine alt UE/LE with TrA 2x10 Standing heel raises with TrA 2x10 Step ups with TrA on 2 inch and 4 inch step x 5 reps bilat each Standing rows with RTB with TrA 2x10 Standing shoulder extension with RTB with TrA 2x10 Standing biceps curls with 2# 2x10  Modified Oswestry:  Initial/Current:  36% / 20%    6/6 Pt ambulated 3 laps with SPC  Supine bridge with TrA 2x10 Supine alt UE/LE with TrA 2x10 Seated Biceps curls with 2# 3x10 Standing rows with RTB 2x10 with TrA Standing shoulder extension with YTB 2x10 with TrA Step ups on 2 inch step with TrA x 10 bilat Standing heel raises with TrA 2x10  6/3 Pt ambulated 3 laps with SPC  Supine bridge with TrA 2x10 Supine alt UE/LE with TrA 2x10 Seated hip abd with RTB 2x10 Seated Biceps curls with 2# 3x10 Standing rows with RTB 2x10  Manual Therapy:  Gentle STM and very gentle rolling with the roller to bilat proximal ant thighs and quads   5/30 Manual:  Roller to anterior hip Trigger point release to anterior hip  Review of self trigger point release  There-ex:  Quad set  3x10  LTR 3x10   Neuro re-ed:  Punches 2lbs 3x10  Bicep curl with posture 3x10 2lbs         PATIENT EDUCATION:  Education details:  HEP, exercise form, rationale of interventions, relevant anatomy, and POC.  PT answered pt's questions. Person educated: Patient Education method:  Explanation, Demonstration, Tactile cues, Verbal cues Education comprehension: verbalized understanding, returned demonstration, verbal cues required, tactile cues required, and needs further education  HOME EXERCISE PROGRAM: Access Code: [email or web address] URL: [email or web address] Date: 02/13/2024 Prepared by: Mose Minerva  Exercises - Log Roll  - Supine Gluteal Sets  - 2  x daily - 7 x weekly - 2 sets - 10 reps - 5 secons hold - Supine Transversus Abdominis Bracing - Hands on Stomach  - 2 x daily - 7 x weekly - 2 sets - 10 reps - Supine Single Leg Ankle Pumps  - 2-3 x daily - 7 x weekly - 2 sets - 10 reps  ASSESSMENT:  CLINICAL IMPRESSION: Overall the patient is made great progress.  She continues to have intermittent pain but it is less intense and less frequent.  Her gait technique is better when she uses the cane.  She is advised this may be a good way to continue to increase her walking distance.  She hopes to get off the cane eventually.  She is advised to be patient and consistent with her exercises. We reviewed her HEP. She will discharge at this time to her HEP. See below for goal specific progress        OBJECTIVE IMPAIRMENTS: decreased activity tolerance, decreased endurance, decreased mobility, difficulty walking, decreased strength, and pain.   ACTIVITY LIMITATIONS: carrying, lifting, bending, standing, squatting, stairs, transfers, bed mobility, and locomotion level  PARTICIPATION LIMITATIONS: meal prep, cleaning, laundry, shopping, and community activity  PERSONAL FACTORS: Time since onset of injury/illness/exacerbation and 1 comorbidity: L hip Gluteus medius repair and  continued L hi pain are also affecting patient's functional outcome.   REHAB POTENTIAL: Good  CLINICAL DECISION MAKING: Stable/uncomplicated  EVALUATION COMPLEXITY: Low   GOALS:  SHORT TERM GOALS: Target date:  03/12/2024    Pt will be independent and compliant with HEP for improved pain, strength, function, and mobility.   Baseline: Goal status: GOAL MET  04/10/24  2.  Pt will report at least a 25% improvement in ambulation.  Baseline:  Goal status: GOAL MET 04/10/24  3.  Pt will progress with walking program without adverse effects. Baseline:  Goal status: GOAL MET  04/10/24  4.  Pt will progress with exercises per protocol without adverse effects for improved core and LE strength in order to for improved tolerance and ease with daily activities and functional mobility.  Baseline:  Goal status: GOAL MET  6/13 Target date:  04/09/2024   LONG TERM GOALS: Target date: 05/07/2024  Pt will report she is able to ambulate extended community distance without increased back pain.  Baseline:  Goal status: able to walk using cane 6/27 2.  Pt will report she is able to perform household chores with expected limitations without significant pain and difficulty.  Baseline:  Goal status: ONGOING  3.  Pt will report good standing tolerance with her ADL's, IADL's, and community mobility.  Baseline:  Goal status: performing all ADL's 6/27 4.  Pt will demonstrate at least a 12% improvement in modified ODI for clinically significant improvement in self perceived disability.  Baseline:  Goal status: GOAL MET  6/13  5.  Pt will be independent with advanced HEP in order to have a consistent exercise routine for improved core and LE strength for good tolerance and performance of ADLs/IADLs and functional mobility.   Baseline:  Goal status: achieved has full HEP     PLAN:  PT FREQUENCY: 2x/wk  PT DURATION: 4 weeks  PLANNED INTERVENTIONS: 97164- PT Re-evaluation, 97750- Physical  Performance Testing, 97110-Therapeutic exercises, 97530- Therapeutic activity, 97112- Neuromuscular re-education, 97535- Self Care, 02859- Manual therapy, [phone]- Gait training, [phone]- Aquatic Therapy, [phone]- Electrical stimulation (unattended), Patient/Family education, Balance training, Stair training, Taping, Dry Needling, Joint mobilization, Scar mobilization, DME instructions, Cryotherapy, and Moist heat.  PLAN FOR NEXT SESSION: Cont per lumbar fusion protocol.     Alm Don PT DPT 04/24/24 8:00 AM

## 2024-04-24 ENCOUNTER — Ambulatory Visit (HOSPITAL_COMMUNITY)
Admission: RE | Admit: 2024-04-24 | Discharge: 2024-04-24 | Disposition: A | Discharge status: Home / Self Care | Source: Ambulatory Visit | Attending: Gastroenterology | Admitting: Gastroenterology

## 2024-04-24 ENCOUNTER — Ambulatory Visit (HOSPITAL_COMMUNITY)

## 2024-04-24 ENCOUNTER — Encounter (HOSPITAL_BASED_OUTPATIENT_CLINIC_OR_DEPARTMENT_OTHER): Payer: Self-pay | Admitting: Physical Therapy

## 2024-04-24 ENCOUNTER — Encounter (HOSPITAL_BASED_OUTPATIENT_CLINIC_OR_DEPARTMENT_OTHER): Admitting: Physical Therapy

## 2024-04-24 DIAGNOSIS — R1032 Left lower quadrant pain: Secondary | ICD-10-CM | POA: Insufficient documentation

## 2024-04-24 DIAGNOSIS — C569 Malignant neoplasm of unspecified ovary: Secondary | ICD-10-CM | POA: Diagnosis not present

## 2024-04-27 ENCOUNTER — Other Ambulatory Visit (HOSPITAL_COMMUNITY)

## 2024-04-27 DIAGNOSIS — M810 Age-related osteoporosis without current pathological fracture: Secondary | ICD-10-CM | POA: Diagnosis not present

## 2024-04-27 DIAGNOSIS — Z780 Asymptomatic menopausal state: Secondary | ICD-10-CM | POA: Diagnosis not present

## 2024-04-28 ENCOUNTER — Other Ambulatory Visit: Payer: Self-pay

## 2024-04-28 ENCOUNTER — Telehealth: Payer: Self-pay

## 2024-04-28 DIAGNOSIS — R109 Unspecified abdominal pain: Secondary | ICD-10-CM

## 2024-04-28 DIAGNOSIS — Z8041 Family history of malignant neoplasm of ovary: Secondary | ICD-10-CM

## 2024-04-28 NOTE — Telephone Encounter (Signed)
 Pt will have abd tomorrow and pelvic was already completed

## 2024-04-28 NOTE — Telephone Encounter (Signed)
 Good afternoon, do you work with Dr. Legrand? Could you advise us  on this ultrasound order for this patient as I see he is out of contact until 05/04/24  11:37 BB Cristino LOISE Single, RN Hello! I am actually not his nurse anymore. I can attach his nurses.   11:44 You; Daphne Moats, RN; and Almarie DELENA Goo, LPN were added by Cristino LOISE Single, RN. 11:44 EM Almarie DELENA Goo, LPN Thank you Wayne. Hello Margaret. I can help. What's up?  11:45 Margaret Graham, RDMS Thank you!  The patient above is scheduled for an ultrasound tomorrow -- an Abdominal Complete US . The indication is the same for the pelvic ultrasound that she had done this week and that code does not really work for the abdominal US   The ABD complete US  includes the abdominal organs (liver,kidney,gb,panc...) it does not evaluate the LLQ  The pelvic US  she had done did evaluate that area  11:48 TC I'm not sure based off the note if the provider wanted an abdominal pelvic ultrasound and transvaginal or if he wanted the abdominal/transvaginal pelvic ultrasound AND an ultrasound of the abdominal organs...SABRASABRASABRA sorry if that is confusing  11:50 EM Almarie DELENA Goo, LPN sister had metastatic ovarian cancer-LLQ pain and constipation. Does that make it sound more connected?   11:51 We have also scheduled an ultrasound of the entire abdomen and pelvis including transvaginal view.  that is from Dr Clayburn note   11:52 TC Margaret Graham, RDMS We can absolutely do the abdominal ultrasound, but the order would need to be changed to an indication for the abdomen  59 mins will abd pain work   59 mins TC Margaret Graham, RDMS yes ma'am  59 mins with family history of ovarian cancer   We are not able to add to the order can you add it or does it need to be a new order   58 mins TC Margaret Graham, RDMS It would likely have to be a new order  58 mins ok, he will not he able to sign it   is that  ok   57 mins Almarie KANDICE Rogue, RT was added by Margaret HERO Lecher, RDMS. 56 mins TC Margaret Graham, RDMS [place], is it okay for us  to perform the US  with the order not signed-- Dr.Danis is out until 7/7  56 mins BB Cristino LOISE Single, RN left. 56 mins Insurance approved correct?  54 mins Almarie KANDICE Rogue, RT No, can another MD sign it?  ET Or his RN?  50 mins Nurses cannot sign   I am not sure if another provider will sign. Are you all not able to add to the current order?   50 mins TC Margaret Graham, RDMS We are not supposed to change orders... if we HAVE to change the order it has to be sent back for cosign, which he would not be there to sign it  49 mins TC ET Almarie KANDICE Rogue, RT Per our radiology manager, the order has to be signed prior to the exam  48 mins So LLQ pain and family history of ovarian cancer will not work it needs to say abd pain and family history of ovarian cancer. I want to make sure what to ask another provider   47 mins ET Almarie KANDICE Rogue, RT Those symptoms go with a pelvic ultrasound not an abdomen ultrasound  47 mins I think that is what was on the abd US  order as  well and insurance approved right? I just want to make sure everything is correct for the pt   45 mins Almarie KANDICE Rogue, RT We typically do not need prior authorization for ultrasound exams, I'm not sure if it was sent for approval or not  ET I think only biopsies need prior approval  44 mins I entered a new order   Delon Failing PA should sign   43 mins Delon Gibson [place], [place] was added by you. 43 mins ET Almarie KANDICE Rogue, RT It will have to be signed  43 mins Delon will you sign please for Dr Legrand   43 mins Almarie KANDICE Rogue, RT This patient just had a pelvic ultrasound on 04/24/24  ET Is there a reason for repeat?  41 mins Dr Legrand ordered both  41 mins ET Almarie KANDICE Rogue, RT It has currently not been  read but was also ordered by Dr Legrand  41 mins yes, he ordered both at her office visit   40 mins ET Almarie KANDICE Rogue, RT I can push it though to be read, but not sure insurance will cover a second scan so close to the first without changing circumstances  40 mins JL Delon Gibson Failing, PA left. 40 mins ET Almarie KANDICE Rogue, RT Does he just need a reading on the one performed on 6/27? Its unfortunate he is out of town and cannot answer this.  39 mins No, he ordered for both to be done   38 mins ET Almarie KANDICE Rogue, RT When did he order both?  38 mins It is in his office note that he wanted both   We have also scheduled an ultrasound of the entire abdomen and pelvis including transvaginal view.  From his note   37 mins ET Almarie KANDICE Rogue, RT On 6/24 was his office note, the pelvic has already been done  37 mins That is all the information we have today. Did Delon sign the new order for you?  36 mins ET Almarie KANDICE Rogue, RT I will need to confirm with one of our radiologists about this. We have performed the pelvic since his office note. It looks as though the abdomen is all that needs to be done  35 mins He did ask for both when he saw her that's all the info I have. We will see what they say.   33 mins I did confirm and both were ordered on 6/24 at the office visit   Hi Dr Jennefer, can you look at this for me please. Dr Clayburn office note from 04/21/24 states he is ordering an abdomen and pelvic ultrasound on this patient. The pelvic was performed on 04/24/24 but has not been read. The abdomen is scheduled for tomorrow at [facility]. Dr Legrand is out of town until 7/7. The office is wanting us  to do a pelvic ultrasound tomorrow when the patient comes in for the abdomen ultrasound. I have tried explaining to them that the pelvic was already done and that only the abdomen needs to be done tomorrow. Since he is OOT, they are unable to ask him for  clarification. Thoughts?  27 mins DS Ester JINNY Jennefer, MD what are they looking for?  7 mins Almarie KANDICE Rogue, RT LLQ pain, sister with ovarian cancer is the reason for the abdomen ultrasound  ET Pelvic was done for the same reason  4 mins Ester JINNY Jennefer, MD I gotcha - I think that's fine then to use what  we already did  DS he just wants the answer, doesn't care when its scanned  4 mins Almarie KANDICE Rogue, RT I agree, will add the RN to this conversation, I told her I was checking with a Rad since Dr Legrand is OOT and can't confirm  Will call [place] to have them read the pelvic asap  ET Thank you for looking at this for me!  1  3 mins You were added by Almarie KANDICE Rogue, RT. 2 mins ET Almarie KANDICE Rogue, RT Shristi Scheib, above is the conversation with one of our radiologists

## 2024-04-29 ENCOUNTER — Ambulatory Visit (HOSPITAL_COMMUNITY)
Admission: RE | Admit: 2024-04-29 | Discharge: 2024-04-29 | Disposition: A | Discharge status: Home / Self Care | Source: Ambulatory Visit | Attending: Gastroenterology | Admitting: Gastroenterology

## 2024-04-29 DIAGNOSIS — N281 Cyst of kidney, acquired: Secondary | ICD-10-CM | POA: Diagnosis not present

## 2024-04-29 DIAGNOSIS — R1032 Left lower quadrant pain: Secondary | ICD-10-CM

## 2024-04-29 DIAGNOSIS — K760 Fatty (change of) liver, not elsewhere classified: Secondary | ICD-10-CM | POA: Diagnosis not present

## 2024-04-29 DIAGNOSIS — Z8041 Family history of malignant neoplasm of ovary: Secondary | ICD-10-CM | POA: Diagnosis not present

## 2024-04-30 DIAGNOSIS — Z7983 Long term (current) use of bisphosphonates: Secondary | ICD-10-CM | POA: Diagnosis not present

## 2024-04-30 DIAGNOSIS — Z8262 Family history of osteoporosis: Secondary | ICD-10-CM | POA: Diagnosis not present

## 2024-04-30 DIAGNOSIS — E039 Hypothyroidism, unspecified: Secondary | ICD-10-CM | POA: Diagnosis not present

## 2024-04-30 DIAGNOSIS — Z5181 Encounter for therapeutic drug level monitoring: Secondary | ICD-10-CM | POA: Diagnosis not present

## 2024-04-30 DIAGNOSIS — M810 Age-related osteoporosis without current pathological fracture: Secondary | ICD-10-CM | POA: Diagnosis not present

## 2024-04-30 DIAGNOSIS — K219 Gastro-esophageal reflux disease without esophagitis: Secondary | ICD-10-CM | POA: Diagnosis not present

## 2024-05-05 ENCOUNTER — Ambulatory Visit: Payer: Self-pay | Admitting: Gastroenterology

## 2024-05-11 DIAGNOSIS — Z1212 Encounter for screening for malignant neoplasm of rectum: Secondary | ICD-10-CM | POA: Diagnosis not present

## 2024-05-11 DIAGNOSIS — E785 Hyperlipidemia, unspecified: Secondary | ICD-10-CM | POA: Diagnosis not present

## 2024-05-11 DIAGNOSIS — E041 Nontoxic single thyroid nodule: Secondary | ICD-10-CM | POA: Diagnosis not present

## 2024-05-11 DIAGNOSIS — E039 Hypothyroidism, unspecified: Secondary | ICD-10-CM | POA: Diagnosis not present

## 2024-05-11 DIAGNOSIS — M810 Age-related osteoporosis without current pathological fracture: Secondary | ICD-10-CM | POA: Diagnosis not present

## 2024-05-18 DIAGNOSIS — M899 Disorder of bone, unspecified: Secondary | ICD-10-CM | POA: Diagnosis not present

## 2024-05-18 DIAGNOSIS — M810 Age-related osteoporosis without current pathological fracture: Secondary | ICD-10-CM | POA: Diagnosis not present

## 2024-05-18 DIAGNOSIS — D126 Benign neoplasm of colon, unspecified: Secondary | ICD-10-CM | POA: Diagnosis not present

## 2024-05-18 DIAGNOSIS — E041 Nontoxic single thyroid nodule: Secondary | ICD-10-CM | POA: Diagnosis not present

## 2024-05-18 DIAGNOSIS — Z1339 Encounter for screening examination for other mental health and behavioral disorders: Secondary | ICD-10-CM | POA: Diagnosis not present

## 2024-05-18 DIAGNOSIS — R1084 Generalized abdominal pain: Secondary | ICD-10-CM | POA: Diagnosis not present

## 2024-05-18 DIAGNOSIS — E785 Hyperlipidemia, unspecified: Secondary | ICD-10-CM | POA: Diagnosis not present

## 2024-05-18 DIAGNOSIS — I700 Atherosclerosis of aorta: Secondary | ICD-10-CM | POA: Diagnosis not present

## 2024-05-18 DIAGNOSIS — Z1331 Encounter for screening for depression: Secondary | ICD-10-CM | POA: Diagnosis not present

## 2024-05-18 DIAGNOSIS — K760 Fatty (change of) liver, not elsewhere classified: Secondary | ICD-10-CM | POA: Diagnosis not present

## 2024-05-18 DIAGNOSIS — E039 Hypothyroidism, unspecified: Secondary | ICD-10-CM | POA: Diagnosis not present

## 2024-05-18 DIAGNOSIS — Z0000 Encounter for general adult medical examination without abnormal findings: Secondary | ICD-10-CM | POA: Diagnosis not present

## 2024-05-21 ENCOUNTER — Encounter: Payer: Self-pay | Admitting: Gastroenterology

## 2024-05-21 ENCOUNTER — Ambulatory Visit: Admitting: Gastroenterology

## 2024-05-21 VITALS — BP 110/70 | HR 68 | Temp 97.3°F | Resp 15 | Ht 65.0 in | Wt 149.0 lb

## 2024-05-21 DIAGNOSIS — Z860100 Personal history of colon polyps, unspecified: Secondary | ICD-10-CM

## 2024-05-21 DIAGNOSIS — K5730 Diverticulosis of large intestine without perforation or abscess without bleeding: Secondary | ICD-10-CM

## 2024-05-21 DIAGNOSIS — D128 Benign neoplasm of rectum: Secondary | ICD-10-CM | POA: Diagnosis not present

## 2024-05-21 DIAGNOSIS — Z1211 Encounter for screening for malignant neoplasm of colon: Secondary | ICD-10-CM | POA: Diagnosis not present

## 2024-05-21 DIAGNOSIS — Z860101 Personal history of adenomatous and serrated colon polyps: Secondary | ICD-10-CM | POA: Diagnosis not present

## 2024-05-21 DIAGNOSIS — K648 Other hemorrhoids: Secondary | ICD-10-CM

## 2024-05-21 DIAGNOSIS — K6289 Other specified diseases of anus and rectum: Secondary | ICD-10-CM

## 2024-05-21 MED ORDER — SODIUM CHLORIDE 0.9 % IV SOLN: 500.0000 mL | Freq: Once | INTRAVENOUS | Status: DC

## 2024-05-21 NOTE — Op Note (Signed)
 [facility] Patient Name: Pranathi Winfree Procedure Date: 05/21/2024 3:00 PM MRN: [id number] Endoscopist: Victory L. Legrand , MD, [id number] Age: 78 Referring MD:  Date of Birth: September 17, 1946 Gender: Female Account #: [id number] Procedure:                Colonoscopy Indications:              Surveillance: Personal history of adenomatous                            polyps on last colonoscopy > 5 years ago                           diminutive rectal tubular adenoma 2019 Medicines:                Monitored Anesthesia Care Procedure:                Pre-Anesthesia Assessment:                           - Prior to the procedure, a History and Physical                            was performed, and patient medications and                            allergies were reviewed. The patient's tolerance of                            previous anesthesia was also reviewed. The risks                            and benefits of the procedure and the sedation                            options and risks were discussed with the patient.                            All questions were answered, and informed consent                            was obtained. Prior Anticoagulants: The patient has                            taken no anticoagulant or antiplatelet agents. ASA                            Grade Assessment: II - A patient with mild systemic                            disease. After reviewing the risks and benefits,                            the patient was deemed in satisfactory condition to  undergo the procedure.                           After obtaining informed consent, the colonoscope                            was passed under direct vision. Throughout the                            procedure, the patient's blood pressure, pulse, and                            oxygen saturations were monitored continuously. The                            Olympus Scope SN [id number] was  introduced through the                            anus and advanced to the the cecum, identified by                            appendiceal orifice and ileocecal valve. The                            colonoscopy was performed without difficulty. The                            patient tolerated the procedure well. The quality                            of the bowel preparation was good. The ileocecal                            valve, appendiceal orifice, and rectum were                            photographed. Scope In: 3:19:32 PM Scope Out: 3:39:49 PM Scope Withdrawal Time: 0 hours 17 minutes 41 seconds  Total Procedure Duration: 0 hours 20 minutes 17 seconds  Findings:                 The digital rectal exam findings include decreased                            sphincter tone.                           Repeat examination of right colon under NBI                            performed.                           Multiple diverticula were found in the left colon.  A 12 x 3 mm soft and frond-like polyp was found in                            the proximal rectum. The polyp was                            semi-pedunculated. The polyp was removed with a hot                            snare. Resection and retrieval were complete.                            (add'l STSC to poypectomy site)                           Internal hemorrhoids were found. The hemorrhoids                            were small.                           The exam was otherwise without abnormality on                            direct and retroflexion views. Complications:            No immediate complications. Estimated Blood Loss:     Estimated blood loss was minimal. Impression:               - Decreased sphincter tone found on digital rectal                            exam.                           - Diverticulosis in the left colon.                           - One 12 mm polyp in the proximal  rectum, removed                            with a hot snare. Resected and retrieved.                           - Internal hemorrhoids.                           - The examination was otherwise normal on direct                            and retroflexion views. Recommendation:           - Patient has a contact number available for                            emergencies. The signs and symptoms of potential  delayed complications were discussed with the                            patient. Return to normal activities tomorrow.                            Written discharge instructions were provided to the                            patient.                           - Resume previous diet.                           - Continue present medications.                           - Await pathology results.                           - No repeat routine surveillance colonoscopy                            recommended due to age and current guidelines. Nomie Buchberger L. Legrand, MD 05/21/2024 3:46:43 PM This report has been signed electronically.

## 2024-05-21 NOTE — Patient Instructions (Signed)
 Handouts provided about hemorrhoids, diverticulosis and polyps.  Resume previous diet.  Continue present medications.  No repeat surveillance colonoscopy recommended due to age and current guidelines.   YOU HAD AN ENDOSCOPIC PROCEDURE TODAY AT THE [facility]:   Refer to the procedure report that was given to you for any specific questions about what was found during the examination.  If the procedure report does not answer your questions, please call your gastroenterologist to clarify.  If you requested that your care partner not be given the details of your procedure findings, then the procedure report has been included in a sealed envelope for you to review at your convenience later.  YOU SHOULD EXPECT: Some feelings of bloating in the abdomen. Passage of more gas than usual.  Walking can help get rid of the air that was put into your GI tract during the procedure and reduce the bloating. If you had a lower endoscopy (such as a colonoscopy or flexible sigmoidoscopy) you may notice spotting of blood in your stool or on the toilet paper. If you underwent a bowel prep for your procedure, you may not have a normal bowel movement for a few days.  Please Note:  You might notice some irritation and congestion in your nose or some drainage.  This is from the oxygen used during your procedure.  There is no need for concern and it should clear up in a day or so.  SYMPTOMS TO REPORT IMMEDIATELY:  Following lower endoscopy (colonoscopy or flexible sigmoidoscopy):  Excessive amounts of blood in the stool  Significant tenderness or worsening of abdominal pains  Swelling of the abdomen that is new, acute  Fever of 100F or higher  For urgent or emergent issues, a gastroenterologist can be reached at any hour by calling [phone]. Do not use MyChart messaging for urgent concerns.    DIET:  We do recommend a small meal at first, but then you may proceed to your regular diet.  Drink  plenty of fluids but you should avoid alcoholic beverages for 24 hours.  ACTIVITY:  You should plan to take it easy for the rest of today and you should NOT DRIVE or use heavy machinery until tomorrow (because of the sedation medicines used during the test).    FOLLOW UP: Our staff will call the number listed on your records the next business day following your procedure.  We will call around 7:15- 8:00 am to check on you and address any questions or concerns that you may have regarding the information given to you following your procedure. If we do not reach you, we will leave a message.     If any biopsies were taken you will be contacted by phone or by letter within the next 1-3 weeks.  Please call us  at [phone] if you have not heard about the biopsies in 3 weeks.    SIGNATURES/CONFIDENTIALITY: You and/or your care partner have signed paperwork which will be entered into your electronic medical record.  These signatures attest to the fact that that the information above on your After Visit Summary has been reviewed and is understood.  Full responsibility of the confidentiality of this discharge information lies with you and/or your care-partner.

## 2024-05-21 NOTE — Progress Notes (Signed)
 No significant changes to clinical history since GI office visit on 04/21/24.  The patient is appropriate for an endoscopic procedure in the ambulatory setting.  - Victory Brand, MD

## 2024-05-21 NOTE — Progress Notes (Signed)
 Called to room to assist during endoscopic procedure.  Patient ID and intended procedure confirmed with present staff. Received instructions for my participation in the procedure from the performing physician.

## 2024-05-21 NOTE — Progress Notes (Signed)
 Pt's states no medical or surgical changes since previsit or office visit.

## 2024-05-21 NOTE — Progress Notes (Signed)
 Sedate, gd SR, tolerated procedure well, VSS, report to RN

## 2024-05-22 ENCOUNTER — Encounter: Payer: Self-pay | Admitting: Gastroenterology

## 2024-05-22 ENCOUNTER — Telehealth: Payer: Self-pay

## 2024-05-22 NOTE — Telephone Encounter (Signed)
  Follow up Call-     05/21/2024    2:43 PM  Call back number  Post procedure Call Back phone  # [phone]  Permission to leave phone message Yes     Patient questions:  Do you have a fever, pain , or abdominal swelling? No. Pain Score  0 *  Have you tolerated food without any problems? Yes.    Have you been able to return to your normal activities? Yes.    Do you have any questions about your discharge instructions: Diet   No. Medications  No. Follow up visit  No.  Do you have questions or concerns about your Care? No.  Actions: * If pain score is 4 or above: No action needed, pain <4.

## 2024-05-26 LAB — SURGICAL PATHOLOGY

## 2024-05-27 ENCOUNTER — Ambulatory Visit: Payer: Self-pay | Admitting: Gastroenterology

## 2024-06-16 DIAGNOSIS — M4316 Spondylolisthesis, lumbar region: Secondary | ICD-10-CM | POA: Diagnosis not present

## 2024-06-19 ENCOUNTER — Other Ambulatory Visit: Payer: Self-pay | Admitting: Neurological Surgery

## 2024-06-19 DIAGNOSIS — M4316 Spondylolisthesis, lumbar region: Secondary | ICD-10-CM

## 2024-06-23 ENCOUNTER — Ambulatory Visit
Admission: RE | Admit: 2024-06-23 | Discharge: 2024-06-23 | Disposition: A | Discharge status: Home / Self Care | Source: Ambulatory Visit | Attending: Neurological Surgery | Admitting: Neurological Surgery

## 2024-06-23 DIAGNOSIS — M4316 Spondylolisthesis, lumbar region: Secondary | ICD-10-CM

## 2024-06-23 DIAGNOSIS — M47817 Spondylosis without myelopathy or radiculopathy, lumbosacral region: Secondary | ICD-10-CM | POA: Diagnosis not present

## 2024-06-30 DIAGNOSIS — Z23 Encounter for immunization: Secondary | ICD-10-CM | POA: Diagnosis not present

## 2024-07-09 ENCOUNTER — Encounter: Payer: Self-pay | Admitting: Gastroenterology

## 2024-07-16 DIAGNOSIS — Z1231 Encounter for screening mammogram for malignant neoplasm of breast: Secondary | ICD-10-CM | POA: Diagnosis not present

## 2024-07-23 DIAGNOSIS — M4316 Spondylolisthesis, lumbar region: Secondary | ICD-10-CM | POA: Diagnosis not present

## 2024-08-13 DIAGNOSIS — M5416 Radiculopathy, lumbar region: Secondary | ICD-10-CM | POA: Diagnosis not present

## 2024-08-21 DIAGNOSIS — Z23 Encounter for immunization: Secondary | ICD-10-CM | POA: Diagnosis not present

## 2024-08-31 ENCOUNTER — Encounter: Payer: Self-pay | Admitting: Radiology
